# Patient Record
Sex: Male | Born: 1945 | Race: White | Hispanic: No | Marital: Married | Smoking: Never smoker
Health system: Southern US, Community
[De-identification: ages and names within clinical notes are randomized; demographics above are authoritative.]

## PROBLEM LIST (undated history)

## (undated) DIAGNOSIS — E118 Type 2 diabetes mellitus with unspecified complications: Secondary | ICD-10-CM

## (undated) DIAGNOSIS — N2889 Other specified disorders of kidney and ureter: Secondary | ICD-10-CM

## (undated) DIAGNOSIS — C801 Malignant (primary) neoplasm, unspecified: Secondary | ICD-10-CM

## (undated) DIAGNOSIS — I2510 Atherosclerotic heart disease of native coronary artery without angina pectoris: Secondary | ICD-10-CM

## (undated) DIAGNOSIS — R55 Syncope and collapse: Secondary | ICD-10-CM

## (undated) DIAGNOSIS — K219 Gastro-esophageal reflux disease without esophagitis: Secondary | ICD-10-CM

## (undated) DIAGNOSIS — I10 Essential (primary) hypertension: Secondary | ICD-10-CM

## (undated) DIAGNOSIS — M25561 Pain in right knee: Secondary | ICD-10-CM

## (undated) DIAGNOSIS — E119 Type 2 diabetes mellitus without complications: Secondary | ICD-10-CM

## (undated) DIAGNOSIS — G4733 Obstructive sleep apnea (adult) (pediatric): Secondary | ICD-10-CM

## (undated) DIAGNOSIS — Z973 Presence of spectacles and contact lenses: Secondary | ICD-10-CM

## (undated) DIAGNOSIS — R0602 Shortness of breath: Secondary | ICD-10-CM

## (undated) DIAGNOSIS — E785 Hyperlipidemia, unspecified: Secondary | ICD-10-CM

## (undated) DIAGNOSIS — N183 Chronic kidney disease, stage 3 unspecified: Secondary | ICD-10-CM

## (undated) DIAGNOSIS — Z789 Other specified health status: Secondary | ICD-10-CM

## (undated) DIAGNOSIS — Z9989 Dependence on other enabling machines and devices: Secondary | ICD-10-CM

## (undated) DIAGNOSIS — Z972 Presence of dental prosthetic device (complete) (partial): Secondary | ICD-10-CM

## (undated) DIAGNOSIS — I219 Acute myocardial infarction, unspecified: Secondary | ICD-10-CM

## (undated) DIAGNOSIS — E1142 Type 2 diabetes mellitus with diabetic polyneuropathy: Secondary | ICD-10-CM

## (undated) HISTORY — DX: Obstructive sleep apnea (adult) (pediatric): G47.33

## (undated) HISTORY — DX: Other specified disorders of kidney and ureter: N28.89

## (undated) HISTORY — DX: Type 2 diabetes mellitus with diabetic polyneuropathy: E11.42

## (undated) HISTORY — PX: COLONOSCOPY: SHX174

## (undated) HISTORY — PX: CERVICAL FUSION: SHX112

## (undated) HISTORY — PX: CORONARY ANGIOPLASTY WITH STENT PLACEMENT: SHX49

## (undated) HISTORY — PX: CARDIOVASCULAR STRESS TEST: SHX262

## (undated) HISTORY — PX: NASAL SINUS SURGERY: SHX719

## (undated) HISTORY — DX: Chronic kidney disease, stage 3 unspecified: N18.30

## (undated) HISTORY — DX: Dependence on other enabling machines and devices: Z99.89

## (undated) HISTORY — DX: Type 2 diabetes mellitus with unspecified complications: E11.8

---

## 1999-01-27 ENCOUNTER — Other Ambulatory Visit: Admission: RE | Admit: 1999-01-27 | Discharge: 1999-01-27 | Payer: Self-pay | Admitting: Gastroenterology

## 1999-01-27 ENCOUNTER — Encounter (INDEPENDENT_AMBULATORY_CARE_PROVIDER_SITE_OTHER): Payer: Self-pay

## 2000-11-09 ENCOUNTER — Inpatient Hospital Stay (HOSPITAL_COMMUNITY): Admission: RE | Admit: 2000-11-09 | Discharge: 2000-11-10 | Payer: Self-pay | Admitting: Neurosurgery

## 2000-11-09 ENCOUNTER — Encounter: Payer: Self-pay | Admitting: Neurosurgery

## 2000-12-01 ENCOUNTER — Encounter: Payer: Self-pay | Admitting: Neurosurgery

## 2000-12-01 ENCOUNTER — Encounter: Admission: RE | Admit: 2000-12-01 | Discharge: 2000-12-01 | Payer: Self-pay | Admitting: Neurosurgery

## 2001-01-17 ENCOUNTER — Encounter: Payer: Self-pay | Admitting: Neurosurgery

## 2001-01-17 ENCOUNTER — Encounter: Admission: RE | Admit: 2001-01-17 | Discharge: 2001-01-17 | Payer: Self-pay | Admitting: Neurosurgery

## 2004-10-07 ENCOUNTER — Ambulatory Visit: Payer: Self-pay | Admitting: Gastroenterology

## 2004-10-07 ENCOUNTER — Encounter (INDEPENDENT_AMBULATORY_CARE_PROVIDER_SITE_OTHER): Payer: Self-pay | Admitting: *Deleted

## 2004-11-18 ENCOUNTER — Ambulatory Visit: Payer: Self-pay | Admitting: Gastroenterology

## 2004-11-24 ENCOUNTER — Encounter (INDEPENDENT_AMBULATORY_CARE_PROVIDER_SITE_OTHER): Payer: Self-pay | Admitting: *Deleted

## 2004-11-24 ENCOUNTER — Ambulatory Visit: Payer: Self-pay | Admitting: Gastroenterology

## 2006-11-21 ENCOUNTER — Encounter: Payer: Self-pay | Admitting: Gastroenterology

## 2006-11-21 ENCOUNTER — Ambulatory Visit (HOSPITAL_COMMUNITY): Admission: EM | Admit: 2006-11-21 | Discharge: 2006-11-21 | Payer: Self-pay | Admitting: Emergency Medicine

## 2006-11-21 ENCOUNTER — Encounter (INDEPENDENT_AMBULATORY_CARE_PROVIDER_SITE_OTHER): Payer: Self-pay | Admitting: *Deleted

## 2006-11-25 ENCOUNTER — Ambulatory Visit: Payer: Self-pay | Admitting: Gastroenterology

## 2010-01-16 ENCOUNTER — Encounter (INDEPENDENT_AMBULATORY_CARE_PROVIDER_SITE_OTHER): Payer: Self-pay | Admitting: *Deleted

## 2010-03-04 ENCOUNTER — Encounter (INDEPENDENT_AMBULATORY_CARE_PROVIDER_SITE_OTHER): Payer: Self-pay | Admitting: *Deleted

## 2010-03-05 ENCOUNTER — Telehealth: Payer: Self-pay | Admitting: Internal Medicine

## 2010-03-06 ENCOUNTER — Ambulatory Visit: Admit: 2010-03-06 | Payer: Self-pay | Admitting: Internal Medicine

## 2010-03-06 ENCOUNTER — Ambulatory Visit: Payer: Self-pay | Admitting: Internal Medicine

## 2010-04-07 NOTE — Letter (Signed)
Summary: New Patient letter  [facility]  [address] [place], [place] [address]   Phone: [phone]  Fax: [phone]       01/16/2010 MRN: [id number]    Richard [name] [address] [place], [place]  [address]  Dear Richard [name],  Welcome to the Gastroenterology Division at [facility].    You are scheduled to see Dr. Leone Payor on 03/06/10 at 9:00 A.M.  on the 3rd floor at [facility], [address]  We ask that you try to arrive at ouroffice 15 minutes prior to your appointment time to allow for check-in.  We would like you to complete the enclosed self-administered evaluation form prior to your visit and bring it with you on the day of your appointment.  We will review it with you.  Also, please bring a complete list of all your medications or, if you prefer, bring the medication bottles and we will list them.  Please bring your insurance card so that we may make a copy of it.  If your insurance requires a referral to see a specialist, please bring your referral form from your primary care physician.  Co-payments are due at the time of your visit and may be paid by cash, check or credit card.     Your office visit will consist of a consult with your physician (includes a physical exam), any laboratory testing he/she may order, scheduling of any necessary diagnostic testing (e.g. x-ray, ultrasound, CT-scan), and scheduling of a procedure (e.g. Endoscopy, Colonoscopy) if required.  Please allow enough time on your schedule to allow for any/all of these possibilities.    If you cannot keep your appointment, please call [phone] to cancel or reschedule prior to your appointment date.  This allows [place] the opportunity to schedule an appointment for another patient in need of care.  If you do not cancel or reschedule by 5 p.m. the business day prior to your appointment date, you will be charged a $50.00 late cancellation/no-show fee.    Thank you for choosing [facility] for your medical needs.  We appreciate the opportunity to care for you.  Please visit [place] at our website  to learn more about our practice.                     Sincerely,                                                             The Gastroenterology Division

## 2010-04-09 NOTE — Procedures (Signed)
Summary: Office Note  Office Note   Imported By: Lamona Curl CMA (AAMA) 03/05/2010 12:40:29  _____________________________________________________________________  External Attachment:    Type:   Image     Comment:   External Document

## 2010-04-09 NOTE — Procedures (Signed)
Summary: COLON   Colonoscopy  Procedure date:  11/24/2004  Findings:      Location:  [facility].    Procedures Next Due Date:    Colonoscopy: 12/2014 Patient Name: Richard [name], Richard [name] MRN:  Procedure Procedures: Colonoscopy CPT: 16109.  Personnel: Endoscopist: Ulyess Mort, MD.  Exam Location: Exam performed in Outpatient Clinic. Outpatient  Patient Consent: Procedure, Alternatives, Risks and Benefits discussed, consent obtained, from patient. Consent was obtained by the RN.  Indications  Increased Risk Screening: For family history of colorectal neoplasia, in   History  Current Medications: Patient is not currently taking Coumadin.  Pre-Exam Physical: Entire physical exam was normal.  Exam Exam: Extent of exam reached: Cecum, extent intended: Cecum.  The cecum was identified by appendiceal orifice and IC valve. Colon retroflexion performed. Images taken. ASA Classification: II. Tolerance: good.  Monitoring: Pulse and BP monitoring, Oximetry used. Supplemental O2 given.  Colon Prep Prep results: good.  Sedation Meds: Patient assessed and found to be appropriate for moderate (conscious) sedation.  Findings - NOT SEEN ON EXAM: Cecum to Rectum. Polyps, Colitis, Tumors, Diverticulosis, Hemorrhoids,   Assessment Normal examination.  Events  Unplanned Interventions: No intervention was required.  Unplanned Events: There were no complications. Plans Medication Plan: Continue current medications.  Patient Education: Patient given standard instructions for: a normal exam. Yearly hemoccult testing recommended. Patient instructed to get routine colonoscopy every 10 years.  Disposition: After procedure patient sent to recovery. After recovery patient sent home.   This report was created from the original endoscopy report, which was reviewed and signed by the above listed endoscopist.

## 2010-04-09 NOTE — Progress Notes (Signed)
Summary: Okay to See Dr Leone Payor  Phone Note Outgoing Call   Call placed by: Lamona Curl CMA Duncan Dull),  March 05, 2010 1:10 PM Call placed to: Patient Summary of Call: Patient was originally on Dr Marvell Fuller schedule to discuss having a colonoscopy due to his family history. He states that he is not having any problems at all. Patient was originally a Dr Victorino Dike patient. Patient also had an endoscopy completed (on an emergency basis-due to swallowed toothpick) at [facility] by Dr Rob Bunting. I have spoken to Dr Leone Payor. He states that he is perfectly fine with the patient coming to see him for GI care (patient prefers to see Dr Leone Payor) in the future. Dr Leone Payor has reviewed patient's last colonoscopy report from 11/24/2004. He is aware that patient's father had colon cancer at age 51 and that patient's uncle had colon cancer around that same age. Per Dr Leone Payor, since patient's colonoscopy was normal and father had colon cancer later in life, patient should still be okay for 10 year recall, which would be in 2016. Patient has been advised of this. He again states that he is not having any problems. I advised him that he does not need to come see Dr Leone Payor in the office unless he wishes otherwise. Appt cancelled for Dr Leone Payor on 03/06/10. I have asked the patient to call us in the future should he develop specific symptoms or find out any changes in family GI history. Patient verbalizes understanding. Initial call taken by: Lamona Curl CMA Duncan Dull),  March 05, 2010 1:18 PM

## 2010-04-09 NOTE — Procedures (Signed)
Summary: EGD   EGD  Procedure date:  11/24/2004  Findings:      Location:  [facility]   Patient Name: Richard [name], Richard [name] MRN:  Procedure Procedures: Panendoscopy (EGD) CPT: 43235.  Personnel: Endoscopist: Ulyess Mort, MD.  Exam Location: Exam performed in Outpatient Clinic. Outpatient  Patient Consent: Procedure, Alternatives, Risks and Benefits discussed, consent obtained, from patient. Consent was obtained by the RN.  Indications Symptoms: Dysphagia. Reflux symptoms  History  Current Medications: Patient is not currently taking Coumadin.  Pre-Exam Physical: Entire physical exam was normal.  Exam Exam Info: Maximum depth of insertion Duodenum, intended Duodenum. Patient position: on left side. Vocal cords visualized. Gastric retroflexion performed. Images taken. ASA Classification: II. Tolerance: good.  Sedation Meds: Patient assessed and found to be appropriate for moderate (conscious) sedation. Fentanyl given IV. Versed given IV. Cetacaine Spray given aerosolized.  Monitoring: BP and pulse monitoring done. Oximetry used. Supplemental O2 given  Findings - ESOPHAGEAL INFLAMMATION: established as a result of reflux. ICD9: GERD: 530.81. Comments: changes in distal 7 cm. 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 # [id number].  - Dilation: Pyloric Sphincter. Maloney dilator used, Diameter: 52,54 mm, Minimal Resistance, No Heme present on extraction. Patient tolerance good.  - MUCOSAL ABNORMALITY: Fundus to Body. Granular mucosa. Edema present.  - MUCOSAL ABNORMALITY: Pyloric Sphincter to Jejunum. Granular mucosa.   Assessment Abnormal examination, see findings above.  Diagnoses: 530.81: GERD.   Events  Unplanned Intervention: No unplanned interventions were required.  Unplanned Events: There were no complications. Plans Medication(s): Continue current medications.  Patient Education: Patient given standard instructions for:  Hiatal Hernia. Reflux. Stenosis / Stricture. Mucosal Abnormality.  Disposition: After procedure patient sent to recovery. After recovery patient sent home.   This report was created from the original endoscopy report, which was reviewed and signed by the above listed endoscopist.

## 2010-04-09 NOTE — Procedures (Signed)
Summary: EGD   EGD  Procedure date:  11/21/2006  Findings:      Location: [facility]   Patient Name: Richard [name], Richard [name] MRN:  Procedure Procedures: Panendoscopy (EGD) CPT: 43235.  Personnel: Endoscopist: Rachael Fee, MD.  Exam Location: Exam performed in Operating Room. Outpatient  Patient Consent: Procedure, Alternatives, Risks and Benefits discussed, consent obtained, from patient. Consent was obtained by the RN.  Indications Symptoms: Dysphagia.  Comments: swallowed a toothpick History  Current Medications: Patient is not currently taking Coumadin.  Comments: Patient history reviewed/updated, physical exam performed prior to initiation of sedation? yes Pre-Exam Physical: Performed Nov 21, 2006  Cardio-pulmonary exam, Abdominal exam, Mental status exam WNL.  Comments: Pt. history reviewed/updated, physical exam performed prior to initiation of sedation? yes Exam Exam Info: Maximum depth of insertion Duodenum, intended Duodenum. Patient position: on left side. Vocal cords visualized. Gastric retroflexion performed. Images taken. ASA Classification: II. Tolerance: good.  Sedation Meds: Patient assessed and found to be appropriate for deep sedation. Sedation was managed by the Nurse Anesthetist.  Monitoring: BP and pulse monitoring done. Oximetry used. Supplemental O2 given  Findings - Normal: Proximal Esophagus to Jejunum. Comments: otherwise normal examination.  No foreign bodies.  OTHER FINDING: non-obstructive, mild peptic stricturing.  This was not dilated. in Distal Esophagus.   Assessment Abnormal examination, see findings above.  Comments: THE TOOTHPICK HAS PASSED FROM HIS UGI TRACT (FORMAL ENT EVALUATION IN ER WAS NORMAL).   Events  Unplanned Intervention: No unplanned interventions were required.  Unplanned Events: There were no complications. Plans Comments: HE KNOWS THAT IF HE HAS ABDOMINAL PAINS THAT WORSEN, HE SHOULD  CALL OR GO TO ER. Disposition: After procedure patient sent to recovery.   cc: The Patient  This report was created from the original endoscopy report, which was reviewed and signed by the above listed endoscopist.

## 2010-04-09 NOTE — Letter (Signed)
Summary: Appointment - Reschedule  [facility]  [address] [place], [place] [address]   Phone: [phone]  Fax: [phone]     March 04, 2010 MRN: [id number]   TORREZ RENFROE [address] [place], [place]  [address]   Dear Mr. Radwan,   Due to a change in our office schedule, your appointment on 03-06-10 at 9am                must be changed.  It is very important that we reach you to reschedule this appointment. We look forward to participating in your health care needs. Please contact us at [phone] at your earliest convenience to reschedule this appointment.     Sincerely,  Acupuncturist Team

## 2010-07-24 NOTE — Op Note (Signed)
[place]  Patient:    Richard [name], Richard [name] Visit Number: [id number] MRN: [id number]          Service Type: SUR Location: [id number] Attending Physician:  Barton Fanny Dictated by:   Hewitt Shorts, M.D. Proc. 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  OPERATION PERFORMED:  C4-5 and C5-6 anterior cervical diskectomy and arthrodesis with iliac crest allograft and tether cervical plating.  SURGEON:  Hewitt Shorts, M.D.  ASSISTANT:  Payton Doughty, M.D.  ANESTHESIA:  General endotracheal.  INDICATIONS FOR PROCEDURE:  The patient is a 65 year old man who presented with neck pain and pain radiating down through the left upper extremity consistent with radiculopathy with some radicular symptoms into the right upper extremity.  Decision was made to proceed with surgical decompression of advanced spondylosis, degenerative disk disease and disk herniation.  DESCRIPTION OF PROCEDURE:  The patient was brought to the operating room and placed under general endotracheal anesthesia.  The patient was placed in 10 pounds of halter traction and the neck was prepped with Betadine soap and solution and draped in sterile fashion.   A horizontal incision was made on the left side of the neck.  The line of the incision was infiltrated with local anesthetic with epinephrine.  Incision was made with a Shaw scalpel at a temperature of 120.  Dissection was carried down through the subcutaneous tissues and platysma.  Dissection was then carried down to dissection plane leaving the sternocleidomastoid, carotid artery and jugular vein laterally and trachea and esophagus medially.  We did  encounter some superficial large jugular veins briding across this area which were tied off and coagulated and incised.  Dissection was carried down to the anterior aspect of the vertebral column which was identified.  An x-ray was taken and the C4-5 and C5-6 vertebral disk spaces identified.  Diskectomy was begun with incision of the annulus and continued with microcurets and pituitary rongeurs.  Both disk spaces were markedly spondylitic with degenerated disk.  Ventral osteophytic overgrowth was removed using double action rongeurs.  The microscope was draped and brought into the field to provide additional magnification, illumination and visualization.  The remainder of the procedure was performed using microdissection and microsurgical technique.  The cartilaginous end plates of the corresponding vertebral body were removed using microcurets and a Micromax drill.  Posterior osteophytic overgrowth was removed using a Micromax drill with a 2 mm Kerrison punch and a thin foot plate.  Uncinate process hypertrophy and spondylitic spurring was removed using the Micromax drill and 2 mm Kerrison punch with a thin foot plate and posterior longitudinal ligament was opened and all loose fragments of disk material were removed.  In the end, good decompression of the spinal canal and thecal sac as well as the foramina and nerve root was achieved bilaterally at each level. Hemostasis was established with the use of Gelfoam soaked in thrombin and once the decompression was completed and hemostasis established, we proceeded with the arthrodesis.  We selected wedges of iliac crest allograft.  These were cut and shaped to size and positioned in the intervertebral disk space  and countersunk.  We then selected a two-level cervical plate and it was positioned over the fusion construct and secured to the C4 and C6 vertebrae with a pair of 4.0 x 14 mm screws and with a single 4.0 x 14 mm screw at C5. Because  of the patients large shoulders, we could not obtain an x-ray that would adequate visualize the fusion construct but under direct vision plate and screws were in good position.  The graft was in good position.  The alignment was good.  The wound was irrigated with bacitracin solution and checked for hemostasis which was established and confirmed.  Then we proceeded with closure.  The platysma was closed with interrupted inverted 2-0 undyed Vicryl sutures.  The subcutaneous and subcuticular layer were closed with interrupted inverted 3-0 undyed Vicryl sutures.  Skin edges were approximated with Dermabond.  The patient tolerated the procedure well.  Estimated blood loss for this procedure was 150 cc.  The sponge and needle counts were correct.  Following surgery the patient was taken out of traction and was placed in a soft cervical collar to be reversed from anesthetic, extubated and transferred to recovery room for further care. Dictated by:   Hewitt Shorts, M.D. Attending Physician:  Barton Fanny DD:  11/09/00 TD:  11/09/00 Job: [id number]

## 2011-10-11 ENCOUNTER — Other Ambulatory Visit: Payer: Self-pay | Admitting: Orthopedic Surgery

## 2011-10-11 MED ORDER — DEXAMETHASONE SODIUM PHOSPHATE 10 MG/ML IJ SOLN: 10.0000 mg | Freq: Once | INTRAMUSCULAR | Status: DC

## 2011-10-11 NOTE — Progress Notes (Signed)
Preoperative surgical orders have been place into the Epic hospital system for Richard [name] on 10/11/2011, 10:04 PM  by Patrica Duel for surgery on 10/22/2011.  Preop Total Knee orders including IV Tylenol, and IV Decadron as long as there are no contraindications to the above medications. Avel Peace, PA-C

## 2011-10-18 ENCOUNTER — Encounter (HOSPITAL_COMMUNITY): Payer: Self-pay

## 2011-10-18 ENCOUNTER — Encounter (HOSPITAL_COMMUNITY): Payer: Self-pay | Admitting: Pharmacy Technician

## 2011-10-18 ENCOUNTER — Encounter (HOSPITAL_COMMUNITY)
Admission: RE | Admit: 2011-10-18 | Discharge: 2011-10-18 | Disposition: A | Discharge status: Home / Self Care | Payer: BC Managed Care – PPO | Source: Ambulatory Visit | Attending: Orthopedic Surgery | Admitting: Orthopedic Surgery

## 2011-10-18 DIAGNOSIS — G4733 Obstructive sleep apnea (adult) (pediatric): Secondary | ICD-10-CM

## 2011-10-18 HISTORY — DX: Pain in right knee: M25.561

## 2011-10-18 HISTORY — DX: Atherosclerotic heart disease of native coronary artery without angina pectoris: I25.10

## 2011-10-18 HISTORY — DX: Gastro-esophageal reflux disease without esophagitis: K21.9

## 2011-10-18 HISTORY — DX: Obstructive sleep apnea (adult) (pediatric): G47.33

## 2011-10-18 HISTORY — DX: Essential (primary) hypertension: I10

## 2011-10-18 HISTORY — DX: Hyperlipidemia, unspecified: E78.5

## 2011-10-18 HISTORY — DX: Shortness of breath: R06.02

## 2011-10-18 LAB — BASIC METABOLIC PANEL
BUN: 15 mg/dL (ref 6–23)
CO2: 30 mEq/L (ref 19–32)
Chloride: 101 mEq/L (ref 96–112)
GFR calc Af Amer: >90 mL/min (ref >90)
Potassium: 4.2 mEq/L (ref 3.5–5.1)

## 2011-10-18 LAB — CBC
HCT: 44.6 % (ref 39.0–52.0)
Hemoglobin: 14.9 g/dL (ref 13.0–17.0)
MCH: 31.6 pg (ref 26.0–34.0)
MCHC: 33.4 g/dL (ref 30.0–36.0)
MCV: 94.7 fL (ref 78.0–100.0)
Platelets: 182 K/uL (ref 150–400)
RBC: 4.71 MIL/uL (ref 4.22–5.81)
RDW: 13.5 % (ref 11.5–15.5)
WBC: 6.9 K/uL (ref 4.0–10.5)

## 2011-10-18 LAB — PROTIME-INR
INR: 0.92 (ref 0.00–1.49)
Prothrombin Time: 12.6 s (ref 11.6–15.2)

## 2011-10-18 LAB — SURGICAL PCR SCREEN
MRSA, PCR: NEGATIVE
Staphylococcus aureus: NEGATIVE

## 2011-10-18 LAB — APTT: aPTT: 37 s (ref 24–37)

## 2011-10-18 NOTE — Patient Instructions (Addendum)
YOUR SURGERY IS SCHEDULED ON:  Friday 8/16  AT 5:00  PM  REPORT TO [facility] [facility]ORT STAY [facility] AT:  2:30 PAM      PHONE # FOR SHORT STAY IS [phone]  DO NOT EAT ANYTHING AFTER MIDNIGHT THE NIGHT BEFORE YOUR SURGERY.  NO FOOD, NO CHEWING GUM, NO MINTS, NO CANDIES, NO CHEWING TOBACCO. YOU MAY HAVE CLEAR LIQUIDS TO DRINK FROM MIDNIGHT UNTIL  11:00 AM DAY OF SURGERY--LIKE WATER, PEPSI, BLACK COFFEE.  NOTHING TO DRINK AFTER 11:00 AM DAY OF SURGERY.  PLEASE TAKE THE FOLLOWING MEDICATIONS THE AM OF YOUR SURGERY WITH A FEW SIPS OF WATER:  AMLODIPINE, METOPROLOL, PANTOPRAZOLE, PRAVASTATIN    IF YOU USE INHALERS--USE YOUR INHALERS THE AM OF YOUR SURGERY AND BRING INHALERS TO THE HOSPITAL -TAKE TO SURGERY.    IF YOU ARE DIABETIC:  DO NOT TAKE ANY DIABETIC MEDICATIONS THE AM OF YOUR SURGERY.  IF YOU TAKE INSULIN IN THE EVENINGS--PLEASE ONLY TAKE 1/2 NORMAL EVENING DOSE THE NIGHT BEFORE YOUR SURGERY.  NO INSULIN THE AM OF YOUR SURGERY.  IF YOU HAVE SLEEP APNEA AND USE CPAP OR BIPAP--PLEASE BRING THE MASK --NOT THE MACHINE-NOT THE TUBING   -JUST THE MASK. DO NOT BRING VALUABLES, MONEY, CREDIT CARDS.  CONTACT LENS, DENTURES / PARTIALS, GLASSES SHOULD NOT BE WORN TO SURGERY AND IN MOST CASES-HEARING AIDS WILL NEED TO BE REMOVED.  BRING YOUR GLASSES CASE, ANY EQUIPMENT NEEDED FOR YOUR CONTACT LENS. FOR PATIENTS ADMITTED TO THE HOSPITAL--CHECK OUT TIME THE DAY OF DISCHARGE IS 11:00 AM.  ALL INPATIENT ROOMS ARE PRIVATE - WITH BATHROOM, TELEPHONE, TELEVISION AND WIFI INTERNET. IF YOU ARE BEING DISCHARGED THE SAME DAY OF YOUR SURGERY--YOU CAN NOT DRIVE YOURSELF HOME--AND SHOULD NOT GO HOME ALONE BY TAXI OR BUS.  NO DRIVING OR OPERATING MACHINERY FOR 24 HOURS FOLLOWING ANESTHESIA / PAIN MEDICATIONS.                            SPECIAL INSTRUCTIONS:  CHLORHEXIDINE SOAP SHOWER (other brand names are Betasept and Hibiclens ) PLEASE SHOWER WITH CHLORHEXIDINE THE NIGHT BEFORE YOUR SURGERY AND THE AM OF YOUR  SURGERY. DO NOT USE CHLORHEXIDINE ON YOUR FACE OR PRIVATE AREAS--YOU MAY USE YOUR NORMAL SOAP THOSE AREAS AND YOUR NORMAL SHAMPOO.  WOMEN SHOULD AVOID SHAVING UNDER ARMS AND SHAVING LEGS 48 HOURS BEFORE USING CHLORHEXIDINE TO AVOID SKIN IRRITATION.  DO NOT USE IF ALLERGIC TO CHLORHEXIDINE.  PLEASE READ OVER ANY  FACT SHEETS THAT YOU WERE GIVEN: MRSA INFORMATIION,  INCENTIVE SPIROMETER INFORMATION.

## 2011-10-18 NOTE — Pre-Procedure Instructions (Signed)
NOTIFIED WENDY AT DR. ALUISIO'S OFFICE THAT PT TAKES EFFIENT -BLOOD THINNER BECAUSE OF HEART STENTING IN FEB  2013.  PT STATES HE STOPPED HIS ASPIRIN--HE WONDERED IF HE WAS TO STOP THE EFFIENT.  WENDY CALLED BACK AND SAID TO INSTRUCT PT NO MORE EFFIENT UNTIL AFTER HIS SURGERY 8/16 --PT INSTRUCTED WHILE IN PREOP. CBC, BMET, PT, PTT WERE DONE TODAY AT [facility] - PREOP. AS PER ANESTHESIOLOGIST'S GUIDELINES.  PT HAS EKG REPORT AND CARDIOLOGY OFFICE NOTE 07/26/11 FROM [facility].  PT HAS CXR REPORT FROM FROM [facility] FROM 04/19/11. PREOP TEACHING DISCUSSED WITH PT USING TEACH BACK METHOD.

## 2011-10-22 ENCOUNTER — Encounter (HOSPITAL_COMMUNITY): Payer: Self-pay | Admitting: *Deleted

## 2011-10-22 ENCOUNTER — Ambulatory Visit (HOSPITAL_COMMUNITY): Payer: BC Managed Care – PPO | Admitting: Anesthesiology

## 2011-10-22 ENCOUNTER — Encounter (HOSPITAL_COMMUNITY): Payer: Self-pay | Admitting: Anesthesiology

## 2011-10-22 ENCOUNTER — Encounter (HOSPITAL_COMMUNITY)
Admission: RE | Disposition: A | Discharge status: Home / Self Care | Payer: Self-pay | Source: Ambulatory Visit | Attending: Orthopedic Surgery

## 2011-10-22 ENCOUNTER — Ambulatory Visit (HOSPITAL_COMMUNITY)
Admission: RE | Admit: 2011-10-22 | Discharge: 2011-10-22 | Disposition: A | Discharge status: Home / Self Care | Payer: BC Managed Care – PPO | Source: Ambulatory Visit | Attending: Orthopedic Surgery | Admitting: Orthopedic Surgery

## 2011-10-22 DIAGNOSIS — G4730 Sleep apnea, unspecified: Secondary | ICD-10-CM | POA: Insufficient documentation

## 2011-10-22 DIAGNOSIS — E785 Hyperlipidemia, unspecified: Secondary | ICD-10-CM | POA: Insufficient documentation

## 2011-10-22 DIAGNOSIS — I10 Essential (primary) hypertension: Secondary | ICD-10-CM | POA: Insufficient documentation

## 2011-10-22 DIAGNOSIS — S83249A Other tear of medial meniscus, current injury, unspecified knee, initial encounter: Secondary | ICD-10-CM | POA: Diagnosis present

## 2011-10-22 DIAGNOSIS — M2240 Chondromalacia patellae, unspecified knee: Secondary | ICD-10-CM | POA: Insufficient documentation

## 2011-10-22 DIAGNOSIS — I2510 Atherosclerotic heart disease of native coronary artery without angina pectoris: Secondary | ICD-10-CM | POA: Insufficient documentation

## 2011-10-22 DIAGNOSIS — X58XXXA Exposure to other specified factors, initial encounter: Secondary | ICD-10-CM | POA: Insufficient documentation

## 2011-10-22 DIAGNOSIS — Z79899 Other long term (current) drug therapy: Secondary | ICD-10-CM | POA: Insufficient documentation

## 2011-10-22 DIAGNOSIS — K219 Gastro-esophageal reflux disease without esophagitis: Secondary | ICD-10-CM | POA: Insufficient documentation

## 2011-10-22 DIAGNOSIS — Z01812 Encounter for preprocedural laboratory examination: Secondary | ICD-10-CM | POA: Insufficient documentation

## 2011-10-22 DIAGNOSIS — IMO0002 Reserved for concepts with insufficient information to code with codable children: Secondary | ICD-10-CM | POA: Insufficient documentation

## 2011-10-22 DIAGNOSIS — Z981 Arthrodesis status: Secondary | ICD-10-CM | POA: Insufficient documentation

## 2011-10-22 HISTORY — PX: KNEE ARTHROSCOPY: SHX127

## 2011-10-22 SURGERY — ARTHROSCOPY, KNEE
Anesthesia: General | Site: Knee | Laterality: Right | Wound class: Clean

## 2011-10-22 MED ORDER — LACTATED RINGERS IR SOLN
Status: DC | PRN
  Administered 2011-10-22: 9000 mL

## 2011-10-22 MED ORDER — DEXTROSE 5 % IV SOLN
3.0000 g | INTRAVENOUS | Status: AC
  Administered 2011-10-22: 3 g via INTRAVENOUS
  Filled 2011-10-22: qty 3000

## 2011-10-22 MED ORDER — EPHEDRINE SULFATE 50 MG/ML IJ SOLN
INTRAMUSCULAR | Status: DC | PRN
  Administered 2011-10-22: 5 mg via INTRAVENOUS

## 2011-10-22 MED ORDER — SODIUM CHLORIDE 0.9 % IV SOLN: INTRAVENOUS | Status: DC

## 2011-10-22 MED ORDER — KETOROLAC TROMETHAMINE 30 MG/ML IJ SOLN
INTRAMUSCULAR | Status: AC
  Filled 2011-10-22: qty 1

## 2011-10-22 MED ORDER — FENTANYL CITRATE 0.05 MG/ML IJ SOLN
25.0000 ug | INTRAMUSCULAR | Status: DC | PRN
  Administered 2011-10-22: 50 ug via INTRAVENOUS

## 2011-10-22 MED ORDER — PROPOFOL 10 MG/ML IV EMUL
INTRAVENOUS | Status: DC | PRN
  Administered 2011-10-22: 250 mg via INTRAVENOUS

## 2011-10-22 MED ORDER — PROMETHAZINE HCL 25 MG/ML IJ SOLN: 6.2500 mg | INTRAMUSCULAR | Status: DC | PRN

## 2011-10-22 MED ORDER — METHOCARBAMOL 500 MG PO TABS: 500.0000 mg | ORAL_TABLET | Freq: Four times a day (QID) | ORAL | Status: AC

## 2011-10-22 MED ORDER — LACTATED RINGERS IV SOLN
INTRAVENOUS | Status: DC
  Administered 2011-10-22: 1000 mL via INTRAVENOUS
  Administered 2011-10-22: 20:00:00 via INTRAVENOUS

## 2011-10-22 MED ORDER — ACETAMINOPHEN 10 MG/ML IV SOLN
INTRAVENOUS | Status: AC
  Filled 2011-10-22: qty 100

## 2011-10-22 MED ORDER — CEFAZOLIN SODIUM-DEXTROSE 2-3 GM-% IV SOLR
INTRAVENOUS | Status: AC
  Filled 2011-10-22: qty 50

## 2011-10-22 MED ORDER — FENTANYL CITRATE 0.05 MG/ML IJ SOLN
INTRAMUSCULAR | Status: AC
  Filled 2011-10-22: qty 2

## 2011-10-22 MED ORDER — FENTANYL CITRATE 0.05 MG/ML IJ SOLN
INTRAMUSCULAR | Status: DC | PRN
  Administered 2011-10-22: 50 ug via INTRAVENOUS
  Administered 2011-10-22: 25 ug via INTRAVENOUS
  Administered 2011-10-22 (×2): 50 ug via INTRAVENOUS

## 2011-10-22 MED ORDER — KETOROLAC TROMETHAMINE 30 MG/ML IJ SOLN
15.0000 mg | Freq: Once | INTRAMUSCULAR | Status: AC | PRN
  Administered 2011-10-22: 30 mg via INTRAVENOUS

## 2011-10-22 MED ORDER — ACETAMINOPHEN 10 MG/ML IV SOLN
1000.0000 mg | Freq: Once | INTRAVENOUS | Status: AC
  Administered 2011-10-22: 1000 mg via INTRAVENOUS

## 2011-10-22 MED ORDER — ONDANSETRON HCL 4 MG/2ML IJ SOLN
INTRAMUSCULAR | Status: DC | PRN
  Administered 2011-10-22: 4 mg via INTRAVENOUS

## 2011-10-22 MED ORDER — CHLORHEXIDINE GLUCONATE 4 % EX LIQD
60.0000 mL | Freq: Once | CUTANEOUS | Status: DC
  Filled 2011-10-22: qty 60

## 2011-10-22 MED ORDER — BUPIVACAINE-EPINEPHRINE PF 0.25-1:200000 % IJ SOLN
INTRAMUSCULAR | Status: AC
  Filled 2011-10-22: qty 30

## 2011-10-22 MED ORDER — OXYCODONE HCL 5 MG PO TABS: 5.0000 mg | ORAL_TABLET | ORAL | Status: AC | PRN

## 2011-10-22 MED ORDER — BUPIVACAINE-EPINEPHRINE 0.25% -1:200000 IJ SOLN
INTRAMUSCULAR | Status: DC | PRN
  Administered 2011-10-22: 20 mL

## 2011-10-22 MED ORDER — MIDAZOLAM HCL 5 MG/5ML IJ SOLN
INTRAMUSCULAR | Status: DC | PRN
  Administered 2011-10-22: 2 mg via INTRAVENOUS

## 2011-10-22 MED ORDER — LIDOCAINE HCL (CARDIAC) 20 MG/ML IV SOLN
INTRAVENOUS | Status: DC | PRN
  Administered 2011-10-22: 100 mg via INTRAVENOUS

## 2011-10-22 MED ORDER — CEFAZOLIN SODIUM 1-5 GM-% IV SOLN
INTRAVENOUS | Status: AC
  Filled 2011-10-22: qty 50

## 2011-10-22 SURGICAL SUPPLY — 31 items
BANDAGE ELASTIC 6 VELCRO ST LF (GAUZE/BANDAGES/DRESSINGS) ×1 IMPLANT
BLADE 4.2CUDA (BLADE) ×2 IMPLANT
CLOTH BEACON ORANGE TIMEOUT ST (SAFETY) ×2 IMPLANT
CUFF TOURN SGL QUICK 34 (TOURNIQUET CUFF) ×2
CUFF TRNQT CYL 34X4X40X1 (TOURNIQUET CUFF) ×1 IMPLANT
DRAPE U-SHAPE 47X51 STRL (DRAPES) ×2 IMPLANT
DRSG EMULSION OIL 3X3 NADH (GAUZE/BANDAGES/DRESSINGS) ×2 IMPLANT
DRSG PAD ABDOMINAL 8X10 ST (GAUZE/BANDAGES/DRESSINGS) ×2 IMPLANT
DURAPREP 26ML APPLICATOR (WOUND CARE) ×2 IMPLANT
GLOVE BIO SURGEON STRL SZ7.5 (GLOVE) ×1 IMPLANT
GLOVE BIO SURGEON STRL SZ8 (GLOVE) ×2 IMPLANT
GLOVE BIOGEL PI IND STRL 8 (GLOVE) ×1 IMPLANT
GLOVE BIOGEL PI INDICATOR 8 (GLOVE) ×1
GLOVE SURG SS PI 6.5 STRL IVOR (GLOVE) ×1 IMPLANT
GOWN STRL NON-REIN LRG LVL3 (GOWN DISPOSABLE) ×3 IMPLANT
IV LACTATED RINGER IRRG 3000ML (IV SOLUTION) ×6
IV LR IRRIG 3000ML ARTHROMATIC (IV SOLUTION) IMPLANT
MANIFOLD NEPTUNE II (INSTRUMENTS) ×3 IMPLANT
PACK ARTHROSCOPY WL (CUSTOM PROCEDURE TRAY) ×2 IMPLANT
PACK ICE MAXI GEL EZY WRAP (MISCELLANEOUS) ×6 IMPLANT
PADDING CAST ABS 6INX4YD NS (CAST SUPPLIES) ×1
PADDING CAST ABS COTTON 6X4 NS (CAST SUPPLIES) IMPLANT
PADDING CAST COTTON 6X4 STRL (CAST SUPPLIES) ×2 IMPLANT
POSITIONER SURGICAL ARM (MISCELLANEOUS) ×2 IMPLANT
SET ARTHROSCOPY TUBING (MISCELLANEOUS) ×2
SET ARTHROSCOPY TUBING LN (MISCELLANEOUS) ×1 IMPLANT
SUT ETHILON 4 0 PS 2 18 (SUTURE) ×2 IMPLANT
TOWEL OR 17X26 10 PK STRL BLUE (TOWEL DISPOSABLE) ×2 IMPLANT
WAND 90 DEG TURBOVAC W/CORD (SURGICAL WAND) ×2 IMPLANT
WATER STERILE IRR 500ML POUR (IV SOLUTION) ×1 IMPLANT
WRAP KNEE MAXI GEL POST OP (GAUZE/BANDAGES/DRESSINGS) ×4 IMPLANT

## 2011-10-22 NOTE — Anesthesia Preprocedure Evaluation (Addendum)
Anesthesia Evaluation  Patient identified by MRN, date of birth, ID band Patient awake    Reviewed: Allergy & Precautions, H&P , NPO status , Patient's Chart, lab work & pertinent test results  Airway Mallampati: II TM Distance: <3 FB Neck ROM: Full    Dental No notable dental hx.    Pulmonary sleep apnea ,  breath sounds clear to auscultation  Pulmonary exam normal       Cardiovascular hypertension, Pt. on medications + CAD and + Cardiac Stents Rhythm:Regular Rate:Normal     Neuro/Psych negative neurological ROS  negative psych ROS   GI/Hepatic negative GI ROS, Neg liver ROS,   Endo/Other  negative endocrine ROSMorbid obesity  Renal/GU negative Renal ROS  negative genitourinary   Musculoskeletal negative musculoskeletal ROS (+)   Abdominal   Peds negative pediatric ROS (+)  Hematology negative hematology ROS (+)   Anesthesia Other Findings   Reproductive/Obstetrics negative OB ROS                           Anesthesia Physical Anesthesia Plan  ASA: III  Anesthesia Plan: General   Post-op Pain Management:    Induction: Intravenous  Airway Management Planned: LMA  Additional Equipment:   Intra-op Plan:   Post-operative Plan:   Informed Consent: I have reviewed the patients History and Physical, chart, labs and discussed the procedure including the risks, benefits and alternatives for the proposed anesthesia with the patient or authorized representative who has indicated his/her understanding and acceptance.   Dental advisory given  Plan Discussed with: CRNA and Surgeon  Anesthesia Plan Comments:         Anesthesia Quick Evaluation

## 2011-10-22 NOTE — Anesthesia Postprocedure Evaluation (Signed)
  Anesthesia Post-op Note  Patient: Richard [name]  Procedure(s) Performed: Procedure(s) (LRB): ARTHROSCOPY KNEE (Right)  Patient Location: PACU  Anesthesia Type: General  Level of Consciousness: awake and alert   Airway and Oxygen Therapy: Patient Spontanous Breathing  Post-op Pain: mild  Post-op Assessment: Post-op Vital signs reviewed, Patient's Cardiovascular Status Stable, Respiratory Function Stable, Patent Airway and No signs of Nausea or vomiting  Post-op Vital Signs: stable  Complications: No apparent anesthesia complications

## 2011-10-22 NOTE — H&P (Signed)
  CC- Richard [name] is a 66 y.o. male who presents with right knee pain.  HPI- . Knee Pain: Patient presents with knee pain involving the  right knee. Onset of the symptoms was several months ago. Inciting event: twisted knee. Current symptoms include giving out, pain located medially, stiffness and swelling. Pain is aggravated by pivoting, rising after sitting and squatting.  Patient has had no prior knee problems. Evaluation to date: MRI: medial meniscal tear. Treatment to date: OTC analgesics which are not very effective and rest.  Past Medical History  Diagnosis Date  . Coronary artery disease     STENT LAD   . Hypertension   . Hyperlipidemia      . Shortness of breath     WITH EXERTION  . GERD (gastroesophageal reflux disease)     HAS HAD ESOPHAGUS STRETCHED SEVERAL TIMES IN THE PAST  . Right knee pain     TORN RIGHT KNEE MEDIAL MENSICAL TEAR  . Sleep apnea 10/18/11    STOP BANG SCORE OF 6    Past Surgical History  Procedure Date  . Cervical fusion     X 2   SURGERIES    SLIGHT LIMITATION ROM  . Nasal sinus surgery     ONE SINUS SURGERY THRU NOSE AND ANOTHER SINUS SURGERY THRU INCISION ABOVE RT EYE    Prior to Admission medications   Medication Sig Start Date End Date Taking? Authorizing Provider  amLODipine (NORVASC) 5 MG tablet Take 5 mg by mouth daily before breakfast.   Yes Historical Provider, MD  cholecalciferol (VITAMIN D) 1000 UNITS tablet Take 2,000 Units by mouth daily. TAKES 2000 UNITS DAILY   Yes Historical Provider, MD  ibuprofen (ADVIL,MOTRIN) 200 MG tablet Take 400 mg by mouth every 6 (six) hours as needed. pain   Yes Historical Provider, MD  metoprolol succinate (TOPROL-XL) 50 MG 24 hr tablet Take 50 mg by mouth daily before breakfast. Take with or immediately following a meal.   Yes Historical Provider, MD  pantoprazole (PROTONIX) 40 MG tablet Take 40 mg by mouth daily.   Yes Historical Provider, MD  prasugrel (EFFIENT) 10 MG TABS Take 10 mg by mouth daily  before breakfast.   Yes Historical Provider, MD  pravastatin (PRAVACHOL) 20 MG tablet Take 20 mg by mouth daily.   Yes Historical Provider, MD  aspirin EC 81 MG tablet Take 81 mg by mouth daily.    Historical Provider, MD   KNEE EXAM soft tissue tenderness over medial joint line, reduced range of motion, collateral ligaments intact, normal ipsilateral hip exam  Physical Examination: General appearance - alert, well appearing, and in no distress Mental status - alert, oriented to person, place, and time Chest - clear to auscultation, no wheezes, rales or rhonchi, symmetric air entry Heart - normal rate, regular rhythm, normal S1, S2, no murmurs, rubs, clicks or gallops Abdomen - soft, nontender, nondistended, no masses or organomegaly Neurological - alert, oriented, normal speech, no focal findings or movement disorder noted   Asessment/Plan--- Right knee medial meniscal tear- - Plan right knee arthroscopy with meniscal debridement. Procedure risks and potential comps discussed with patient who elects to proceed. Goals are decreased pain and increased function with a high likelihood of achieving both

## 2011-10-22 NOTE — Op Note (Signed)
Preoperative diagnosis-  Right knee medial meniscal tear  Postoperative diagnosis Right- knee medial meniscal tear   Plus Right medial femoral chondral defect  Procedure- Right knee arthroscopy with medial  Meniscal debridement and chondroplasty  Surgeon- Gus Rankin. Bora Bost, MD  Anesthesia-General  EBL-  minimal Complications- None  Condition- PACU - hemodynamically stable.  Brief clinical note- -Richard [name] is a 66 y.o.  male with a several month history of left knee pain and mechanical symptoms. Exam and history suggested medial meniscal tear confirmed by MRI. The patient presents now for arthroscopy and debridement   Procedure in detail -       After successful administration of General anesthetic, a tourmiquet is placed high on the Right  thigh and the Right lower extremity is prepped and draped in the usual sterile fashion. Time out is performed by the surgical team. Standard superomedial and inferolateral portal sites are marked and incisions made with an 11 blade. The inflow cannula is passed through the superomedial portal and camera through the inferolateral portal and inflow is initiated. Arthroscopic visualization proceeds.      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 The medial and lateral gutters are visualized and there are  no loose bodies. Flexion and valgus force is applied to the knee and the medial compartment is entered. A spinal needle is passed into the joint through the site marked for the inferomedial portal. A small incision is made and the dilator passed into the joint. The findings for the medial compartment are there is a degenerative tear of the medial meniscus and a 2 x 2 cm chondral defect medial femoral condyle . The tear is debrided to a stable base with baskets and a shaver and sealed off with the Arthrocare. The shaver is used to debride the unstable cartilage to  a stable cartilaginous base with stable edges. It is probed and found to be stable.    The intercondylar notch is visualized and the ACL appears normal. The lateral compartment is entered and the findings are normal .      The joint is again inspected and there are no other tears, defects or loose bodies identified. The arthroscopic equipment is then removed from the inferior portals which are closed with interrupted 4-0 nylon. 20 ml of .25% Marcaine with epinephrine are injected through the inflow cannula and the cannula is then removed and the portal closed with nylon. The incisions are cleaned and dried and a bulky sterile dressing is applied. The patient is then awakened and transported to recovery in stable condition.   10/22/2011, 7:11 PM

## 2011-10-22 NOTE — Transfer of Care (Signed)
Immediate Anesthesia Transfer of Care Note  Patient: Richard [name]  Procedure(s) Performed: Procedure(s) (LRB): ARTHROSCOPY KNEE (Right)  Patient Location: PACU  Anesthesia Type: General  Level of Consciousness: awake, alert , oriented and patient cooperative  Airway & Oxygen Therapy: Patient Spontanous Breathing and Patient connected to face mask oxygen  Post-op Assessment: Report given to PACU RN, Post -op Vital signs reviewed and stable and Patient moving all extremities  Post vital signs: Reviewed and stable  Complications: No apparent anesthesia complications

## 2011-10-22 NOTE — Preoperative (Signed)
Beta Blockers   Reason not to administer Beta Blockers:Toprol taken at 0700 10-22-11

## 2011-10-22 NOTE — Interval H&P Note (Signed)
History and Physical Interval Note:  10/22/2011 6:00 PM  Richard [name]  has presented today for surgery, with the diagnosis of right knee medial meniscus tear  The various methods of treatment have been discussed with the patient and family. After consideration of risks, benefits and other options for treatment, the patient has consented to  Procedure(s) (LRB): ARTHROSCOPY KNEE (Right) as a surgical intervention .  The patient's history has been reviewed, patient examined, no change in status, stable for surgery.  I have reviewed the patient's chart and labs.  Questions were answered to the patient's satisfaction.     Loanne Drilling

## 2011-10-25 ENCOUNTER — Encounter (HOSPITAL_COMMUNITY): Payer: Self-pay | Admitting: Orthopedic Surgery

## 2012-10-08 ENCOUNTER — Observation Stay (HOSPITAL_COMMUNITY)
Admission: EM | Admit: 2012-10-08 | Discharge: 2012-10-09 | Disposition: A | Discharge status: Home / Self Care | Payer: Medicare Other | Attending: Cardiovascular Disease | Admitting: Cardiovascular Disease

## 2012-10-08 ENCOUNTER — Emergency Department (HOSPITAL_COMMUNITY): Payer: Medicare Other

## 2012-10-08 ENCOUNTER — Ambulatory Visit (HOSPITAL_COMMUNITY): Admit: 2012-10-08 | Payer: Self-pay | Admitting: Cardiovascular Disease

## 2012-10-08 ENCOUNTER — Encounter (HOSPITAL_COMMUNITY): Payer: Self-pay | Admitting: *Deleted

## 2012-10-08 ENCOUNTER — Encounter (HOSPITAL_COMMUNITY)
Admission: EM | Disposition: A | Discharge status: Home / Self Care | Payer: Self-pay | Source: Home / Self Care | Attending: Cardiovascular Disease

## 2012-10-08 DIAGNOSIS — Y832 Surgical operation with anastomosis, bypass or graft as the cause of abnormal reaction of the patient, or of later complication, without mention of misadventure at the time of the procedure: Secondary | ICD-10-CM | POA: Insufficient documentation

## 2012-10-08 DIAGNOSIS — I10 Essential (primary) hypertension: Secondary | ICD-10-CM | POA: Insufficient documentation

## 2012-10-08 DIAGNOSIS — K219 Gastro-esophageal reflux disease without esophagitis: Secondary | ICD-10-CM | POA: Insufficient documentation

## 2012-10-08 DIAGNOSIS — R079 Chest pain, unspecified: Secondary | ICD-10-CM | POA: Insufficient documentation

## 2012-10-08 DIAGNOSIS — T82897A Other specified complication of cardiac prosthetic devices, implants and grafts, initial encounter: Principal | ICD-10-CM | POA: Insufficient documentation

## 2012-10-08 DIAGNOSIS — E785 Hyperlipidemia, unspecified: Secondary | ICD-10-CM | POA: Insufficient documentation

## 2012-10-08 DIAGNOSIS — I2510 Atherosclerotic heart disease of native coronary artery without angina pectoris: Secondary | ICD-10-CM | POA: Insufficient documentation

## 2012-10-08 DIAGNOSIS — I249 Acute ischemic heart disease, unspecified: Secondary | ICD-10-CM

## 2012-10-08 DIAGNOSIS — I200 Unstable angina: Secondary | ICD-10-CM | POA: Insufficient documentation

## 2012-10-08 HISTORY — DX: Syncope and collapse: R55

## 2012-10-08 HISTORY — PX: LEFT HEART CATHETERIZATION WITH CORONARY ANGIOGRAM: SHX5451

## 2012-10-08 LAB — CBC WITH DIFFERENTIAL/PLATELET
Basophils Absolute: 0 10*3/uL (ref 0.0–0.1)
Eosinophils Absolute: 0.1 10*3/uL (ref 0.0–0.7)
Eosinophils Relative: 1 % (ref 0–5)
HCT: 45.6 % (ref 39.0–52.0)
Lymphocytes Relative: 22 % (ref 12–46)
Lymphs Abs: 2.5 10*3/uL (ref 0.7–4.0)
MCH: 32.8 pg (ref 26.0–34.0)
MCV: 92.9 fL (ref 78.0–100.0)
Monocytes Absolute: 0.9 10*3/uL (ref 0.1–1.0)
Platelets: 189 10*3/uL (ref 150–400)
RDW: 13.8 % (ref 11.5–15.5)

## 2012-10-08 LAB — COMPREHENSIVE METABOLIC PANEL
ALT: 24 U/L (ref 0–53)
CO2: 28 mEq/L (ref 19–32)
Calcium: 9.8 mg/dL (ref 8.4–10.5)
Creatinine, Ser: 1.38 mg/dL — ABNORMAL HIGH (ref 0.50–1.35)
GFR calc Af Amer: 60 mL/min — ABNORMAL LOW (ref >90)
GFR calc non Af Amer: 52 mL/min — ABNORMAL LOW (ref >90)
Glucose, Bld: 126 mg/dL — ABNORMAL HIGH (ref 70–99)
Sodium: 140 mEq/L (ref 135–145)
Total Protein: 6.8 g/dL (ref 6.0–8.3)

## 2012-10-08 LAB — PROTIME-INR
INR: 0.99 (ref 0.00–1.49)
Prothrombin Time: 12.9 seconds (ref 11.6–15.2)

## 2012-10-08 LAB — GLUCOSE, CAPILLARY: Glucose-Capillary: 142 mg/dL — ABNORMAL HIGH (ref 70–99)

## 2012-10-08 LAB — POCT I-STAT TROPONIN I: Troponin i, poc: 0 ng/mL (ref 0.00–0.08)

## 2012-10-08 LAB — MRSA PCR SCREENING: MRSA by PCR: NEGATIVE

## 2012-10-08 SURGERY — LEFT HEART CATHETERIZATION WITH CORONARY ANGIOGRAM
Anesthesia: LOCAL

## 2012-10-08 MED ORDER — MORPHINE SULFATE 4 MG/ML IJ SOLN
4.0000 mg | Freq: Once | INTRAMUSCULAR | Status: AC | PRN
  Administered 2012-10-08: 4 mg via INTRAVENOUS
  Filled 2012-10-08: qty 1

## 2012-10-08 MED ORDER — HEPARIN SODIUM (PORCINE) 1000 UNIT/ML IJ SOLN
INTRAMUSCULAR | Status: AC
  Filled 2012-10-08: qty 1

## 2012-10-08 MED ORDER — NITROGLYCERIN IN D5W 200-5 MCG/ML-% IV SOLN
5.0000 ug/min | INTRAVENOUS | Status: DC
  Administered 2012-10-08: 5 ug/min via INTRAVENOUS
  Filled 2012-10-08: qty 250

## 2012-10-08 MED ORDER — HEPARIN (PORCINE) IN NACL 100-0.45 UNIT/ML-% IJ SOLN
1300.0000 [IU]/h | INTRAMUSCULAR | Status: DC
  Administered 2012-10-08: 1300 [IU]/h via INTRAVENOUS
  Filled 2012-10-08: qty 250

## 2012-10-08 MED ORDER — ONDANSETRON HCL 4 MG/2ML IJ SOLN: 4.0000 mg | Freq: Four times a day (QID) | INTRAMUSCULAR | Status: DC | PRN

## 2012-10-08 MED ORDER — SODIUM CHLORIDE 0.9 % IV SOLN: INTRAVENOUS | Status: DC

## 2012-10-08 MED ORDER — VITAMIN D3 25 MCG (1000 UNIT) PO TABS
2000.0000 [IU] | ORAL_TABLET | Freq: Every day | ORAL | Status: DC
  Administered 2012-10-08 – 2012-10-09 (×2): 2000 [IU] via ORAL
  Filled 2012-10-08 (×2): qty 2

## 2012-10-08 MED ORDER — AMLODIPINE BESYLATE 5 MG PO TABS
5.0000 mg | ORAL_TABLET | Freq: Every day | ORAL | Status: DC
  Administered 2012-10-09: 5 mg via ORAL
  Filled 2012-10-08 (×2): qty 1

## 2012-10-08 MED ORDER — MORPHINE SULFATE 2 MG/ML IJ SOLN
INTRAMUSCULAR | Status: AC
  Filled 2012-10-08: qty 1

## 2012-10-08 MED ORDER — ACETAMINOPHEN 325 MG PO TABS: 650.0000 mg | ORAL_TABLET | ORAL | Status: DC | PRN

## 2012-10-08 MED ORDER — IBUPROFEN 600 MG PO TABS
600.0000 mg | ORAL_TABLET | Freq: Every morning | ORAL | Status: DC
  Administered 2012-10-09: 600 mg via ORAL
  Filled 2012-10-08: qty 1

## 2012-10-08 MED ORDER — HEPARIN BOLUS VIA INFUSION
4000.0000 [IU] | Freq: Once | INTRAVENOUS | Status: AC
  Administered 2012-10-08: 4000 [IU] via INTRAVENOUS

## 2012-10-08 MED ORDER — MORPHINE SULFATE 2 MG/ML IJ SOLN
2.0000 mg | Freq: Once | INTRAMUSCULAR | Status: AC
  Administered 2012-10-08: 2 mg via INTRAVENOUS

## 2012-10-08 MED ORDER — VERAPAMIL HCL 2.5 MG/ML IV SOLN
INTRAVENOUS | Status: AC
  Filled 2012-10-08: qty 2

## 2012-10-08 MED ORDER — SIMVASTATIN 40 MG PO TABS
40.0000 mg | ORAL_TABLET | Freq: Every day | ORAL | Status: DC
  Filled 2012-10-08: qty 1

## 2012-10-08 MED ORDER — NITROGLYCERIN 0.4 MG SL SUBL: 0.4000 mg | SUBLINGUAL_TABLET | SUBLINGUAL | Status: DC | PRN

## 2012-10-08 MED ORDER — ASPIRIN EC 81 MG PO TBEC
81.0000 mg | DELAYED_RELEASE_TABLET | Freq: Every day | ORAL | Status: DC
  Administered 2012-10-09: 81 mg via ORAL
  Filled 2012-10-08: qty 1

## 2012-10-08 MED ORDER — PANTOPRAZOLE SODIUM 40 MG PO TBEC
40.0000 mg | DELAYED_RELEASE_TABLET | Freq: Every day | ORAL | Status: DC
  Administered 2012-10-08 – 2012-10-09 (×2): 40 mg via ORAL
  Filled 2012-10-08 (×2): qty 1

## 2012-10-08 MED ORDER — MORPHINE SULFATE 2 MG/ML IJ SOLN: 1.0000 mg | INTRAMUSCULAR | Status: DC | PRN

## 2012-10-08 MED ORDER — SODIUM CHLORIDE 0.9 % IJ SOLN
3.0000 mL | Freq: Two times a day (BID) | INTRAMUSCULAR | Status: DC
  Administered 2012-10-08 – 2012-10-09 (×2): 3 mL via INTRAVENOUS

## 2012-10-08 MED ORDER — HEPARIN (PORCINE) IN NACL 2-0.9 UNIT/ML-% IJ SOLN
INTRAMUSCULAR | Status: AC
  Filled 2012-10-08: qty 1000

## 2012-10-08 MED ORDER — SODIUM CHLORIDE 0.9 % IV SOLN
INTRAVENOUS | Status: AC
  Administered 2012-10-08 – 2012-10-09 (×2): via INTRAVENOUS

## 2012-10-08 MED ORDER — SODIUM CHLORIDE 0.9 % IJ SOLN: 3.0000 mL | INTRAMUSCULAR | Status: DC | PRN

## 2012-10-08 MED ORDER — LIDOCAINE HCL (PF) 1 % IJ SOLN
INTRAMUSCULAR | Status: AC
  Filled 2012-10-08: qty 30

## 2012-10-08 MED ORDER — METOPROLOL SUCCINATE ER 25 MG PO TB24
25.0000 mg | ORAL_TABLET | Freq: Every day | ORAL | Status: DC
  Administered 2012-10-08 – 2012-10-09 (×2): 25 mg via ORAL
  Filled 2012-10-08 (×2): qty 1

## 2012-10-08 MED ORDER — NITROGLYCERIN 0.2 MG/ML ON CALL CATH LAB
INTRAVENOUS | Status: AC
  Filled 2012-10-08: qty 1

## 2012-10-08 MED ORDER — SODIUM CHLORIDE 0.9 % IV SOLN: 250.0000 mL | INTRAVENOUS | Status: DC | PRN

## 2012-10-08 NOTE — ED Notes (Signed)
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  Pt. Began to feel light headed and passed out.  Pt. Loss control over urine and was very pale and diapheretic. Pt. Took One Nitro and 324mg  ASA.  Pt. Has a past Hx. Of a cardiac Stent and a "widow Maker heart Attack."

## 2012-10-08 NOTE — H&P (Signed)
ADMISSION HISTORY AND PHYSICAL   Date: 10/08/2012               Patient Name:  Richard [name] MRN: [id number]  DOB: 1945/10/22 Age / Sex: 67 y.o., male        PCP: Pcp Not In System Primary Cardiologist: [facility]         History of Present Illness: Patient is a 67 y.o. male with a PMHx of CAD, who was admitted to [facility] on 10/08/2012 for evaluation of  CP , weakness, pre-syncope.  Patient is a 67 yo M PMHx significant for CAD, LAD stent placement ( Feb. 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  Around 11 AM  Patient became very pale and diaphoretic and then had a syncopal episode.  He had profuse diaphoresis . Pt took one Nitro tablet and 324mg  ASA prior to arrival which has eased his chest pressure down to 3/10. Patient is followed at [facility].   He had an episode of CP a month ago while walking to the wood shed.  He stopped to rest and the pain resolved after 20-30 minutes.   He has not felt well since that time.  He is currently feeling a bit better.  Still having 2/10 CP / tightness / heaviness  He is trying to eat better.  He does not exercise regularly.  Stays active but has not been able to do as much for the past month.    Medications: Outpatient medications: Current Facility-Administered Medications  Medication Dose Route Frequency Provider Last Rate Last Dose  . heparin ADULT infusion 100 units/mL (25000 units/250 mL)  1,000 Units/hr Intravenous Continuous Nelia Shi, MD      . heparin bolus via infusion 4,000 Units  4,000 Units Intravenous Once Nelia Shi, MD      . nitroGLYCERIN 0.2 mg/mL in dextrose 5 % infusion  5 mcg/min Intravenous Titrated Nelia Shi, MD       Current Outpatient Prescriptions  Medication  Sig Dispense Refill  . amLODipine (NORVASC) 5 MG tablet Take 5 mg by mouth daily before breakfast.      . aspirin EC 81 MG tablet Take 81 mg by mouth daily.      . cholecalciferol (VITAMIN D) 1000 UNITS tablet Take 2,000 Units by mouth daily.       Marland Kitchen ibuprofen (ADVIL,MOTRIN) 200 MG tablet Take 600 mg by mouth every morning. pain      . metFORMIN (GLUCOPHAGE) 500 MG tablet Take 500 mg by mouth 2 (two) times daily with a meal.      . metoprolol succinate (TOPROL-XL) 25 MG 24 hr tablet Take 25 mg by mouth daily.      . Omega-3 Fatty Acids (OMEGA 3 PO) Take 1 capsule by mouth daily.      . pantoprazole (PROTONIX) 40 MG tablet Take 40 mg by mouth daily.      . pravastatin (PRAVACHOL) 20 MG tablet Take 20 mg by mouth daily.         No Known Allergies   Past Medical History  Diagnosis Date  . Coronary artery disease     STENT LAD   . Hypertension   . Hyperlipidemia      .  Shortness of breath     WITH EXERTION  . GERD (gastroesophageal reflux disease)     HAS HAD ESOPHAGUS STRETCHED SEVERAL TIMES IN THE PAST  . Right knee pain     TORN RIGHT KNEE MEDIAL MENSICAL TEAR  . Sleep apnea 10/18/11    STOP BANG SCORE OF 6    Past Surgical History  Procedure Laterality Date  . Cervical fusion      X 2   SURGERIES    SLIGHT LIMITATION ROM  . Nasal sinus surgery      ONE SINUS SURGERY THRU NOSE AND ANOTHER SINUS SURGERY THRU INCISION ABOVE RT EYE  . Knee arthroscopy  10/22/2011    Procedure: ARTHROSCOPY KNEE;  Surgeon: Loanne Drilling, MD;  Location: [facility];  Service: Orthopedics;  Laterality: Right;  Right knee scope with debridement  . Coronary angioplasty with stent placement      History reviewed. No pertinent family history.  Social History:  reports that he has never smoked. He has quit using smokeless tobacco. He reports that he does not drink alcohol or use illicit drugs.   Review of Systems: Constitutional:  denies fever, chills, diaphoresis, appetite change and fatigue.  HEENT:  denies photophobia, eye pain, redness, hearing loss, ear pain, congestion, sore throat, rhinorrhea, sneezing, neck pain, neck stiffness and tinnitus.  Respiratory: denies SOB, DOE, cough, chest tightness, and wheezing.  Cardiovascular: admits to chest pain, palpitations and +  leg swelling.  Gastrointestinal: denies nausea, vomiting, abdominal pain, diarrhea, constipation, blood in stool.  Genitourinary: denies dysuria, urgency, frequency, hematuria, flank pain and difficulty urinating.  Musculoskeletal: denies  myalgias, back pain, joint swelling, arthralgias and gait problem.   Skin: denies pallor, rash and wound.  Neurological: denies dizziness, seizures, syncope, weakness, light-headedness, numbness and headaches.   Hematological: denies adenopathy, easy bruising, personal or family bleeding history.  Psychiatric/ Behavioral: denies suicidal ideation, mood changes, confusion, nervousness, sleep disturbance and agitation.    Physical Exam: BP 114/69  Pulse 76  Temp(Src) 97.7 F (36.5 C) (Oral)  Resp 19  SpO2 99%  General: Vital signs reviewed and noted. Well-developed,  Obese, mildly anxious   Head: Normocephalic, atraumatic, sclera anicteric, mucus membranes are moist  Neck: Supple. Negative for carotid bruits. JVD not elevated.  Lungs:  Clear bilaterally to auscultation without wheezes, rales, or rhonchi. Breathing is unlabored.  Heart: RRR with S1 S2. No murmurs, rubs, or gallops    Abdomen:  Obese, no, + BS  MSK: Strength and the appear normal for age.  Extremities: No clubbing or cyanosis. No edema.  Distal pedal pulses are 2+ and equal bilaterally.  Neurologic: Alert and oriented X 3. Moves all extremities spontaneously  Psych:  Responds to questions appropriately with a normal affect.    Lab results: Basic Metabolic Panel:  Recent Labs Lab 10/08/12 1258  NA 140  K 4.1  CL 100  CO2 28  GLUCOSE 126*  BUN 21  CREATININE 1.38*  CALCIUM 9.8    Liver Function  Tests:  Recent Labs Lab 10/08/12 1258  AST 24  ALT 24  ALKPHOS 85  BILITOT 0.4  PROT 6.8  ALBUMIN 4.0   No results found for this basename: LIPASE, AMYLASE,  in the last 168 hours  CBC:  Recent Labs Lab 10/08/12 1258  WBC 11.3*  NEUTROABS 7.7  HGB 16.1  HCT 45.6  MCV 92.9  PLT 189    Cardiac Enzymes: No results found for this basename: CKTOTAL, CKMB, CKMBINDEX, TROPONINI,  in the  last 168 hours  BNP: No components found with this basename: POCBNP,   CBG: No results found for this basename: GLUCAP,  in the last 168 hours  Coagulation Studies:  Recent Labs  10/08/12 1258  LABPROT 12.9  INR 0.99     Other results:  EKG :  NSR, no ST or T wave changes.    Imaging: Dg Chest Portable 1 View  10/08/2012   *RADIOLOGY REPORT*  Clinical Data: Near syncope.  Chest pressure.  PORTABLE CHEST - 1 VIEW  Comparison: None.  Findings: Low lung volumes.  Bibasilar opacities, left greater than right, likely atelectasis.  Heart size is accentuated by the low volumes and portable nature of the study, which I suspect is within normal limits.  No visible significant effusion or acute bony abnormality.  IMPRESSION: Low lung volumes with bibasilar opacities, left greater than right, likely atelectasis.   Original Report Authenticated By: Charlett Nose, M.D.       Assessment & Plan: 1. CAD:   Richard [name] presents with symptoms of unstable angina.   These symptoms are very similar to his presenting symptoms that he had a year ago before his stent.   He has had similar symptoms a month ago and has not felt well since that time.  His initial cardiac markers are negative.  He is having very mild chest heaviness ( 2/10 ) we are starting heparin and NTG.  He is very stoic and seems to minimize his symptoms.    Will admit. Check cardiac enzymes.  Anticipate cath tomorrow unless we have to take him to the lab tonight.   His ECG does not look acute.    DVT PPX -    Vesta Mixer, Montez Hageman., MD,  [facility] 10/08/2012, 3:00 PM

## 2012-10-08 NOTE — CV Procedure (Signed)
Richard [name] is a 67 y.o. male    [id number] LOCATION:  FACILITY: [facility]  PHYSICIAN: Nanetta Batty, M.D. [birth year]   DATE OF PROCEDURE:  10/08/2012  DATE OF DISCHARGE:   CARDIAC CATHETERIZATION     History obtained from chart review. 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 He presented [place] or chest pain there was ongoing despite IV heparin and nitroglycerin. EKG at no acute changes. He was evaluated by Dr. Elease Hashimoto , [facility] , who felt that he should come to the cath lab for diagnostic coronary arteriography to define his anatomy.   PROCEDURE DESCRIPTION:    The patient was brought to the second floor  Cardiac cath lab in the postabsorptive state. He was no premedicated . His right wristwas prepped and shaved in usual sterile fashion. Xylocaine 1% was used for local anesthesia. A 6 French sheath was inserted into the right radial artery using standard Seldinger technique. The patient received 4000 units  of heparin  intravenously.  A. TIG catheter and pigtail catheter were used for selective coronary angiography, left ventriculography, and supravalvular aortography. Visipaque was used for the entirety of the case. Retrograde aortic, left ventricular and pullback pressures were recorded. A total of 80 cc of contrast was administered to the patient.    HEMODYNAMICS:    AO SYSTOLIC/AO DIASTOLIC: 99/62   LV SYSTOLIC/LV DIASTOLIC: 97/18  ANGIOGRAPHIC RESULTS:   1. Left main; normal  2. LAD; patent stent in the proximal LAD with at most 30% smooth in-stent restenosis 3. Left circumflex; nondominant and normal.  4. Right coronary artery; large, dominant and normal 5 Left ventriculography; RAO left ventriculogram was performed using  25 mL of Visipaque dye at 12 mL/second. The overall LVEF estimated  60% Without wall motion abnormalities 6. Supravalvular aortography: Supravalvular  aortogram was performed using 20 cc of Visipaque dye at 20 cc per second. The aortic root appeared dilated the bladder there was no dissection and the arch vessels appeared intact  IMPRESSION:Richard [name] has a widely patent proximal LAD stent with otherwise normal coronary arteries and normal left function. There is no obvious culprit vessel or source of chest pain that is cardiovascular. The sheath was removed and a T-R band was placed on the right wrist to achieve . The patient left the Cath Lab in stable condition. The cardiac enzymes will be cycled. He'll be treated empirically with antibiotics medications.   Richard Gess MD, [facility] 10/08/2012 6:29 PM

## 2012-10-08 NOTE — ED Notes (Signed)
Per pt.'s wife.  Pt.'s blood pressure on scene was 78/41 with his personal B/P machine.

## 2012-10-08 NOTE — ED Notes (Signed)
Cardiologist is at bedside.  New ECG done.

## 2012-10-08 NOTE — Progress Notes (Signed)
ANTICOAGULATION CONSULT NOTE - Initial Consult  Pharmacy Consult for Heparin Indication: chest pain/ACS  No Known Allergies  Patient Measurements:  Weight- 121.5 kg Height- 5'11 IBW- 75.3 kg  Heparin Dosing Weight: 102 kg  Vital Signs: Temp: 97.7 F (36.5 C) (08/03 1238) Temp src: Oral (08/03 1238) BP: 114/69 mmHg (08/03 1418) Pulse Rate: 76 (08/03 1418)  Labs:  Recent Labs  10/08/12 1258  HGB 16.1  HCT 45.6  PLT 189  APTT 28  LABPROT 12.9  INR 0.99  CREATININE 1.38*    The CrCl is unknown because both a height and weight (above a minimum accepted value) are required for this calculation.   Medical History: Past Medical History  Diagnosis Date  . Coronary artery disease     STENT LAD   . Hypertension   . Hyperlipidemia      . Shortness of breath     WITH EXERTION  . GERD (gastroesophageal reflux disease)     HAS HAD ESOPHAGUS STRETCHED SEVERAL TIMES IN THE PAST  . Right knee pain     TORN RIGHT KNEE MEDIAL MENSICAL TEAR  . 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 CBC is wnl. No bleeding reported. SCR is elevated at 1.38.    Goal of Therapy:  Heparin level 0.3-0.7 units/ml Monitor platelets by anticoagulation protocol: Yes   Plan:  1. Continue heparin bolus of 4000 units, and increase drip rate to 1300 units/hr. 2. Heparin level in 6 hours.  3. Daily heparin level and CBC while on therapy.   Link Snuffer, PharmD, BCPS Clinical Pharmacist [phone] 10/08/2012,3:20 PM

## 2012-10-08 NOTE — ED Provider Notes (Signed)
CSN: [id number]     Arrival date & time 10/08/12  1243 History     First MD Initiated Contact with Patient 10/08/12 1247     Chief Complaint  Patient presents with  . Chest Pain  . 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 Patient became very pale and diaphoretic and then had a syncopal episode with loss of control of his urine. Pt took one Nitro tablet and 324mg  ASA prior to arrival which has eased his chest pressure down to 3/10. Patient is followed at [facility]. Patient had an LAD stent placed two years ago with his last stress test one year ago.    Past Medical History  Diagnosis Date  . Coronary artery disease     STENT LAD   . Hypertension   . Hyperlipidemia      . Shortness of breath     WITH EXERTION  . GERD (gastroesophageal reflux disease)     HAS HAD ESOPHAGUS STRETCHED SEVERAL TIMES IN THE PAST  . Right knee pain     TORN RIGHT KNEE MEDIAL MENSICAL TEAR  . Sleep apnea 10/18/11    STOP BANG SCORE OF 6   Past Surgical History  Procedure Laterality Date  . Cervical fusion      X 2   SURGERIES    SLIGHT LIMITATION ROM  . Nasal sinus surgery      ONE SINUS SURGERY THRU NOSE AND ANOTHER SINUS SURGERY THRU INCISION ABOVE RT EYE  . Knee arthroscopy  10/22/2011    Procedure: ARTHROSCOPY KNEE;  Surgeon: Loanne Drilling, MD;  Location: [facility];  Service: Orthopedics;  Laterality: Right;  Right knee scope with debridement  . Coronary angioplasty with stent placement     History reviewed. No pertinent family history. History  Substance Use Topics  . Smoking status: Never Smoker   . Smokeless tobacco: Former Neurosurgeon  . Alcohol  Use: No    Review of Systems  Constitutional: Positive for diaphoresis. Negative for fever and chills.  HENT: Negative.   Eyes: Negative for pain.  Respiratory: Positive for shortness of breath. Negative for cough.   Cardiovascular: Positive for chest pain.  Gastrointestinal: Positive for nausea. Negative for vomiting and abdominal pain.  Genitourinary: Negative.   Musculoskeletal: Positive for back pain.  Skin: Negative.   Neurological: Positive for syncope and light-headedness.    Allergies  Review of patient's allergies indicates no known allergies.  Home Medications   Current Outpatient Rx  Name  Route  Sig  Dispense  Refill  . amLODipine (NORVASC) 5 MG tablet   Oral   Take 5 mg by mouth daily before breakfast.         . aspirin EC 81 MG tablet   Oral   Take 81 mg by mouth daily.         . cholecalciferol (VITAMIN D) 1000 UNITS tablet   Oral   Take 2,000 Units by mouth daily.          Marland Kitchen ibuprofen (ADVIL,MOTRIN) 200 MG tablet   Oral   Take 600 mg by mouth every morning. pain         . metFORMIN (GLUCOPHAGE) 500 MG tablet   Oral  Take 500 mg by mouth 2 (two) times daily with a meal.         . metoprolol succinate (TOPROL-XL) 25 MG 24 hr tablet   Oral   Take 25 mg by mouth daily.         . Omega-3 Fatty Acids (OMEGA 3 PO)   Oral   Take 1 capsule by mouth daily.         . pantoprazole (PROTONIX) 40 MG tablet   Oral   Take 40 mg by mouth daily.         . pravastatin (PRAVACHOL) 20 MG tablet   Oral   Take 20 mg by mouth daily.          BP 114/69  Pulse 76  Temp(Src) 97.7 F (36.5 C) (Oral)  Resp 19  SpO2 99% Physical Exam  Constitutional: He is oriented to person, place, and time. He appears well-developed and well-nourished. No distress.  HENT:  Head: Normocephalic and atraumatic.  Mouth/Throat: Oropharynx is clear and moist.  Eyes: Conjunctivae and EOM are normal. Pupils are equal, round, and reactive to light.  Neck: Neck  supple.  Cardiovascular: Normal rate, regular rhythm, normal heart sounds and intact distal pulses.   Pulmonary/Chest: Effort normal and breath sounds normal. No respiratory distress.  Abdominal: Soft. There is no tenderness.  Musculoskeletal: He exhibits no edema.  Neurological: He is alert and oriented to person, place, and time.  Skin: Skin is warm. No rash noted. He is diaphoretic. No pallor.    ED Course   Procedures (including critical care time)   Date: 10/08/2012  Rate: 79  Rhythm: normal sinus rhythm  QRS Axis: left  Intervals: normal  ST/T Wave abnormalities: normal  Conduction Disutrbances:left anterior fascicular block  Narrative Interpretation: LVH, Anterior Q waves  Old EKG Reviewed: none available    Labs Reviewed  CBC WITH DIFFERENTIAL - Abnormal; Notable for the following:    WBC 11.3 (*)    All other components within normal limits  COMPREHENSIVE METABOLIC PANEL - Abnormal; Notable for the following:    Glucose, Bld 126 (*)    Creatinine, Ser 1.38 (*)    GFR calc non Af Amer 52 (*)    GFR calc Af Amer 60 (*)    All other components within normal limits  PROTIME-INR  APTT  HEPARIN LEVEL (UNFRACTIONATED)  POCT I-STAT TROPONIN I   Dg Chest Portable 1 View  10/08/2012   *RADIOLOGY REPORT*  Clinical Data: Near syncope.  Chest pressure.  PORTABLE CHEST - 1 VIEW  Comparison: None.  Findings: Low lung volumes.  Bibasilar opacities, left greater than right, likely atelectasis.  Heart size is accentuated by the low volumes and portable nature of the study, which I suspect is within normal limits.  No visible significant effusion or acute bony abnormality.  IMPRESSION: Low lung volumes with bibasilar opacities, left greater than right, likely atelectasis.   Original Report Authenticated By: Charlett Nose, M.D.   1. CAD (coronary artery disease)   2. Acute coronary syndrome   3. Unstable angina     MDM  Concern for cardiac etiology of Chest Pain. No acute  abnormalities found on EKG and first round of cardiac enzymes negative. Cardiology has been consulted and will see patient in the ED for likely admit. Dr. Elease Hashimoto recommended Heparin and Nitro gtt and if patient continues to have pain despite these additional medications then patient may be going to the cath lab. On re-evaluation pt's pain has not improved,  pt will be taken to cath lab d/t un resolving CP. This case was discussed with Dr. Radford Pax who has seen the patient and agrees with plan to admit.             Jeannetta Ellis, PA-C 10/08/12 1827

## 2012-10-08 NOTE — H&P (Signed)
.    Pt was reexamined and existing H & P reviewed. No changes found.  Runell Gess, MD [facility] 10/08/2012 5:49 PM

## 2012-10-09 ENCOUNTER — Encounter (HOSPITAL_COMMUNITY): Payer: Self-pay | Admitting: Physician Assistant

## 2012-10-09 DIAGNOSIS — I2510 Atherosclerotic heart disease of native coronary artery without angina pectoris: Secondary | ICD-10-CM

## 2012-10-09 LAB — CBC
HCT: 41.2 % (ref 39.0–52.0)
MCHC: 34.2 g/dL (ref 30.0–36.0)
MCV: 93.4 fL (ref 78.0–100.0)
RDW: 14 % (ref 11.5–15.5)
WBC: 8.7 10*3/uL (ref 4.0–10.5)

## 2012-10-09 LAB — LIPID PANEL
Cholesterol: 117 mg/dL (ref 0–200)
HDL: 32 mg/dL — ABNORMAL LOW (ref >39)
Total CHOL/HDL Ratio: 3.7 RATIO

## 2012-10-09 LAB — BASIC METABOLIC PANEL
BUN: 18 mg/dL (ref 6–23)
CO2: 25 mEq/L (ref 19–32)
Chloride: 104 mEq/L (ref 96–112)
Creatinine, Ser: 0.99 mg/dL (ref 0.50–1.35)

## 2012-10-09 MED ORDER — ASPIRIN 300 MG RE SUPP
300.0000 mg | RECTAL | Status: DC
  Filled 2012-10-09: qty 1

## 2012-10-09 MED ORDER — NITROGLYCERIN 0.4 MG SL SUBL: 0.4000 mg | SUBLINGUAL_TABLET | SUBLINGUAL | Status: DC | PRN

## 2012-10-09 MED ORDER — METFORMIN HCL 500 MG PO TABS: 500.0000 mg | ORAL_TABLET | Freq: Two times a day (BID) | ORAL | Status: DC

## 2012-10-09 MED ORDER — ASPIRIN 81 MG PO CHEW: 324.0000 mg | CHEWABLE_TABLET | ORAL | Status: DC

## 2012-10-09 MED ORDER — ASPIRIN EC 81 MG PO TBEC
81.0000 mg | DELAYED_RELEASE_TABLET | Freq: Every day | ORAL | Status: DC
  Filled 2012-10-09: qty 1

## 2012-10-09 MED ORDER — ACETAMINOPHEN 325 MG PO TABS: 650.0000 mg | ORAL_TABLET | ORAL | Status: DC | PRN

## 2012-10-09 MED ORDER — PRAVASTATIN SODIUM 40 MG PO TABS
80.0000 mg | ORAL_TABLET | Freq: Every day | ORAL | Status: DC
  Filled 2012-10-09: qty 2

## 2012-10-09 NOTE — Progress Notes (Signed)
Patient ID: Richard [name], male   DOB: 20-Sep-1945, 67 y.o.   MRN: [id number]    Subjective:  Denies SSCP, palpitations or Dyspnea Wants to go home   Objective:  Filed Vitals:   10/09/12 0501 10/09/12 0600 10/09/12 0700 10/09/12 0735  BP:  141/84 146/78 142/73  Pulse:  69 71 69  Temp:    98.7 F (37.1 C)  TempSrc:    Oral  Resp:  17 21 18   Height:      Weight:      SpO2: 96% 96% 95% 96%    Intake/Output from previous day:  Intake/Output Summary (Last 24 hours) at 10/09/12 0753 Last data filed at 10/09/12 0700  Gross per 24 hour  Intake   2035 ml  Output   1350 ml  Net    685 ml    Physical Exam: Affect appropriate Obese white male HEENT: normal Neck supple with no adenopathy JVP normal no bruits no thyromegaly Lungs clear with no wheezing and good diaphragmatic motion Heart:  S1/S2 no murmur, no rub, gallop or click PMI normal Abdomen: benighn, BS positve, no tenderness, no AAA no bruit.  No HSM or HJR Distal pulses intact with no bruits No edema Neuro non-focal Skin warm and dry No muscular weakness Right radial A   Lab Results: Basic Metabolic Panel:  Recent Labs  40/98/11 1258 10/09/12 0225  NA 140 140  K 4.1 3.9  CL 100 104  CO2 28 25  GLUCOSE 126* 106*  BUN 21 18  CREATININE 1.38* 0.99  CALCIUM 9.8 8.8   Liver Function Tests:  Recent Labs  10/08/12 1258  AST 24  ALT 24  ALKPHOS 85  BILITOT 0.4  PROT 6.8  ALBUMIN 4.0   No results found for this basename: LIPASE, AMYLASE,  in the last 72 hours CBC:  Recent Labs  10/08/12 1258  WBC 11.3*  NEUTROABS 7.7  HGB 16.1  HCT 45.6  MCV 92.9  PLT 189   Cardiac Enzymes:  Recent Labs  10/08/12 2110 10/09/12 0225  TROPONINI <0.30 <0.30   Fasting Lipid Panel:  Recent Labs  10/09/12 0225  CHOL 117  HDL 32*  LDLCALC 46  TRIG 914*  CHOLHDL 3.7    Imaging: Dg Chest Portable 1 View  10/08/2012   *RADIOLOGY REPORT*  Clinical Data: Near syncope.  Chest pressure.  PORTABLE  CHEST - 1 VIEW  Comparison: None.  Findings: Low lung volumes.  Bibasilar opacities, left greater than right, likely atelectasis.  Heart size is accentuated by the low volumes and portable nature of the study, which I suspect is within normal limits.  No visible significant effusion or acute bony abnormality.  IMPRESSION: Low lung volumes with bibasilar opacities, left greater than right, likely atelectasis.   Original Report Authenticated By: Charlett Nose, M.D.    Cardiac Studies:  ECG:  SR rate 73 LAD LVH no acute changes    Telemetry:  NSR no VT 10/09/2012   Echo:   Medications:   . amLODipine  5 mg Oral QAC breakfast  . aspirin  324 mg Oral NOW   Or  . aspirin  300 mg Rectal NOW  . [START ON 10/10/2012] aspirin  324 mg Oral Pre-Cath  . aspirin EC  81 mg Oral Daily  . aspirin EC  81 mg Oral Daily  . cholecalciferol  2,000 Units Oral Daily  . ibuprofen  600 mg Oral q morning - 10a  . metoprolol succinate  25 mg Oral Daily  .  pantoprazole  40 mg Oral Daily  . simvastatin  40 mg Oral q1800  . sodium chloride  3 mL Intravenous Q12H     . nitroGLYCERIN Stopped (10/08/12 1950)    Assessment/Plan:  Chest Pain:  Patent LAD stent No evidence for DVT  Continue medical Rx aspirin and beta blocker  F/U Nahser Chol:  Continue statin HTN:  Continue amlodipine   Charlton Haws 10/09/2012, 7:53 AM

## 2012-10-09 NOTE — Progress Notes (Signed)
Documented numerous brief episodes of patient SPO2 in lower 80s. Desat associated with brief apneic episodes while Pt sleeping

## 2012-10-09 NOTE — Progress Notes (Signed)
Pt and caregiver given d/c instructions using teachback method per AVS. Pt and Caregiver used teach back method when verbalizing all instructions given. IVs removed. Pt assisted into WC and taken to the pt drop off are by the nurse tech.

## 2012-10-09 NOTE — Plan of Care (Signed)
Problem: Food- and Nutrition-Related Knowledge Deficit (NB-1.1) Goal: Nutrition education Formal process to instruct or train a patient/client in a skill or to impart knowledge to help patients/clients voluntarily manage or modify food choices and eating behavior to maintain or improve health.  Outcome: Completed/Met Date Met:  10/09/12 Nutrition Education Note  RD consulted for nutrition education regarding a Heart Healthy diet.   Lipid Panel     Component Value Date/Time    CHOL 117 10/09/2012 0225    TRIG 194* 10/09/2012 0225    HDL 32* 10/09/2012 0225    CHOLHDL 3.7 10/09/2012 0225    VLDL 39 10/09/2012 0225    LDLCALC 46 10/09/2012 0225    RD provided "Low Sodium Nutrition Therapy" handout from the Academy of [employer] and Dietetics. Reviewed patient's dietary recall. Provided examples on ways to decrease sodium and fat intake in diet. Discouraged intake of processed foods and use of salt shaker. Reviewed seasoning options.  Discussed sea salt. Encouraged fresh fruits and vegetables as well as whole grain sources of carbohydrates to maximize fiber intake. Teach back method used.  Expect good compliance.  Body mass index is 36.59 kg/(m^2). Pt meets criteria for obese class 2 based on current BMI.  Current diet order is Heart Healthy, patient is consuming approximately 75% of meals at this time. Labs and medications reviewed. No further nutrition interventions warranted at this time. RD contact information provided. If additional nutrition issues arise, please re-consult RD.  Loyce Dys, MS RD LDN Clinical Inpatient Dietitian Pager: [phone] Weekend/After hours pager: [phone]

## 2012-10-09 NOTE — Discharge Summary (Signed)
Discharge Summary   Patient ID: Richard [name] MRN: [id number], DOB/AGE: January 07, 1946 67 y.o. Admit date: 10/08/2012 D/C date:     10/09/2012  Primary Cardiologist: Nahser  Primary Discharge Diagnoses:  1. Chest pain with stable CAD by cath this admission (patent stent, otherwise normal coronaries) 2. Syncope 3. CAD s/p LAD stent 04/2011 4. HTN 5. Hyperlipidemia  Secondary Discharge Diagnoses:  1. GERD  2. R knee pain TORN RIGHT KNEE MEDIAL MENSICAL TEAR  3. Sleep apnea STOP BANG SCORE OF 6   Hospital Course: Mr. 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 Around 11 AM he became very pale and diaphoretic and then had a syncopal episode. He had profuse diaphoresis. He took one nitro tablet and 324mg  ASA prior to arrival which eased CP down to 3/10. He had an episode of CP a month ago while walking to the wood shed. He stopped to rest and the pain resolved after 20-30 minutes. He has not felt well since that time. EKG showed NSR without ST-T wave changes.  He was started on heparin per pharmacy and underwent cardiac cath due to concern for [place]. This demonstrated a widely patent prox LAD with otherwise normal coronary arteries and EF 60%. Supravalvular aortogram showed that the aortic root appeared dilated ("the bladder"?) there was no dissection and the arch vessels appeared intact. Dr. Eden Emms reviewed this information per our discussion and does not feel any further workup is necessary at this time. He was not tachycardic or tachypnic. He did have nocturnal desaturations while sleeping but this was consistent with his prior sleep apnea, was otherwise without daytime hypoxia. Telemetry did not reveal any arrhythmia.  This morning the patient feels well. Labwork was unrevealing. Troponins remained negative. Dr. Eden Emms has seen and examined the patient today and feels he is stable for discharge. The patient was instructed to continue treatment for sleep apnea given the nocturnal desaturations documented in the hospital.  Discharge Vitals: Blood pressure 130/77, pulse 73, temperature 98.7 F (37.1 C), temperature source Oral, resp. rate 20, height 5\' 11"  (1.803 m), weight 262 lb 3.2 oz (118.933 kg), SpO2 95.00%.  Labs: Lab Results  Component Value Date   WBC 8.7 10/09/2012   HGB 14.1 10/09/2012   HCT 41.2 10/09/2012   MCV 93.4 10/09/2012   PLT 158 10/09/2012     Recent Labs Lab 10/08/12 1258 10/09/12 0225  NA 140 140  K 4.1 3.9  CL 100 104  CO2 28 25  BUN 21 18  CREATININE 1.38* 0.99  CALCIUM 9.8 8.8  PROT 6.8  --   BILITOT 0.4  --   ALKPHOS 85  --   ALT 24  --   AST 24  --   GLUCOSE 126* 106*    Recent Labs  10/08/12 2110 10/09/12 0225 10/09/12 0832  TROPONINI <0.30 <0.30 <0.30   Lab Results  Component Value Date   CHOL 117 10/09/2012   HDL 32* 10/09/2012   LDLCALC 46 10/09/2012   TRIG 194* 10/09/2012    Diagnostic Studies/Procedures   Dg Chest Portable 1 View 10/08/2012   *RADIOLOGY REPORT*  Clinical Data: Near syncope.  Chest pressure.  PORTABLE CHEST - 1 VIEW  Comparison: None.  Findings: Low lung volumes.  Bibasilar opacities, left greater than right, likely atelectasis.  Heart size is accentuated by the low volumes and portable nature of the study, which I suspect is within normal limits.  No visible significant effusion or acute bony abnormality.  IMPRESSION: Low lung volumes with bibasilar opacities, left greater than right, likely atelectasis.   Original Report Authenticated By: Charlett Nose, M.D.   Cardiac Cath 10/08/12 1. Left main; normal  2. LAD; patent stent in the proximal LAD with at most 30% smooth in-stent restenosis  3. Left circumflex; nondominant and normal.  4. Right coronary  artery; large, dominant and normal  5 Left ventriculography; RAO left ventriculogram was performed using  25 mL of Visipaque dye at 12 mL/second. The overall LVEF estimated  60% Without wall motion abnormalities  6. Supravalvular aortography: Supravalvular aortogram was performed using 20 cc of Visipaque dye at 20 cc per second. The aortic root appeared dilated the bladder there was no dissection and the arch vessels appeared intact  IMPRESSION:Mr. Millirons has a widely patent proximal LAD stent with otherwise normal coronary arteries and normal left function. There is no obvious culprit vessel or source of chest pain that is cardiovascular. The sheath was removed and a T-R band was placed on the right wrist to achieve . The patient left the Cath Lab in stable condition. The cardiac enzymes will be cycled. He'll be treated empirically with antibiotics medications.   Discharge Medications     Medication List         amLODipine 5 MG tablet  Commonly known as:  NORVASC  Take 5 mg by mouth daily before breakfast.     aspirin EC 81 MG tablet  Take 81 mg by mouth daily.     cholecalciferol 1000 UNITS tablet  Commonly known as:  VITAMIN D  Take 2,000 Units by mouth daily.     ibuprofen 200 MG tablet  Commonly known as:  ADVIL,MOTRIN  Take 600 mg by mouth every morning. pain     metFORMIN 500 MG tablet  Commonly known as:  GLUCOPHAGE  Take 1 tablet (500 mg total) by mouth 2 (two) times daily with a meal.  Start taking on:  10/11/2012     metoprolol succinate 25 MG 24 hr tablet  Commonly known as:  TOPROL-XL  Take 25 mg by mouth daily.     nitroGLYCERIN 0.4 MG SL tablet  Commonly known as:  NITROSTAT  Place 1 tablet (0.4 mg total) under the tongue every 5 (five) minutes as needed for chest pain (up to 3 doses).     OMEGA 3 PO  Take 1 capsule by mouth daily.     pantoprazole 40 MG tablet  Commonly known as:  PROTONIX  Take 40 mg by mouth daily.     pravastatin 20 MG tablet    Commonly known as:  PRAVACHOL  Take 20 mg by mouth daily.        Disposition   The patient will be discharged in stable condition to home. Discharge Orders   Future Appointments Provider Department Dept Phone   10/31/2012 8:50 AM Beatrice Lecher, PA-C [facility] Main Office [place]) [phone]   Future Orders Complete By Expires     Diet - low sodium heart healthy  As directed     Increase activity slowly  As directed     Comments:      No driving for 2 days. No lifting over 5 lbs for 1  week. No sexual activity for 1 week. Keep procedure site clean & dry. If you notice increased pain, swelling, bleeding or pus, call/return!  You may shower, but no soaking baths/hot tubs/pools for 1 week. If your symptoms come back, stop what you are doing and seek medical attention.      Follow-up Information   Follow up with Primary Care Provider. (It is important that you continue treatment for your sleep apnea since we were able to tell in the hospital that your oxygen level goes down at night when you sleep. Follow up with your PCP for periodic monitoring.)       Follow up with Tereso Newcomer, PA-C. (10/31/12 at 8:50am)    Contact information:   [address] [place] [address] [phone]         Duration of Discharge Encounter: Greater than 30 minutes including physician and PA time.  Signed, Dayna Dunn PA-C 10/09/2012, 11:09 AM

## 2012-10-12 NOTE — ED Provider Notes (Signed)
Medical screening examination/treatment/procedure(s) were performed by non-physician practitioner and as supervising physician I was immediately available for consultation/collaboration.    Gabriela Giannelli L Sarita Hakanson, MD 10/12/12 0811 

## 2012-10-13 NOTE — Progress Notes (Signed)
Chaplain visited pt in E47 after a nurse tech told me that pt would be going to the cath lab. Turned out not to be code STEMI. Rather, he was going to cath lab to define his coronary anatomy in order to determine reason for his chest pain. Pt's wife and mother were present. 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 He was very much at peace, and I departed after a short visit.

## 2012-10-31 ENCOUNTER — Encounter: Payer: Self-pay | Admitting: Physician Assistant

## 2012-10-31 ENCOUNTER — Ambulatory Visit (INDEPENDENT_AMBULATORY_CARE_PROVIDER_SITE_OTHER): Payer: Medicare Other | Admitting: Physician Assistant

## 2012-10-31 VITALS — BP 138/84 | HR 63 | Ht 71.0 in | Wt 272.0 lb

## 2012-10-31 DIAGNOSIS — E785 Hyperlipidemia, unspecified: Secondary | ICD-10-CM

## 2012-10-31 DIAGNOSIS — I10 Essential (primary) hypertension: Secondary | ICD-10-CM | POA: Insufficient documentation

## 2012-10-31 DIAGNOSIS — G4733 Obstructive sleep apnea (adult) (pediatric): Secondary | ICD-10-CM

## 2012-10-31 DIAGNOSIS — I2510 Atherosclerotic heart disease of native coronary artery without angina pectoris: Secondary | ICD-10-CM

## 2012-10-31 DIAGNOSIS — I25119 Atherosclerotic heart disease of native coronary artery with unspecified angina pectoris: Secondary | ICD-10-CM | POA: Insufficient documentation

## 2012-10-31 DIAGNOSIS — E1169 Type 2 diabetes mellitus with other specified complication: Secondary | ICD-10-CM | POA: Insufficient documentation

## 2012-10-31 NOTE — Progress Notes (Signed)
[address] [place], [place]  [address] Phone: [phone] Fax:  [phone]  Date:  10/31/2012   ID:  Richard [name], DOB 1945/10/28, MRN [id number]  PCP:  No primary provider on file.  Cardiologist:  Dr. Delane Ginger     History of Present Illness: Richard [name] is a 67 y.o. male who returns for followup after recent admission to the hospital for chest pain.  He has a history of CAD, status post DES to the LAD in 04/2011, HTN, HL, GERD, sleep apnea.  He was admitted 8/3-8/4 after presenting with chest discomfort and syncope. Cardiac markers remained negative. Symptoms are similar to his previous presentation and cardiac catheterization was arranged.LHC 10/08/12: Proximal LAD stent patent with 30% ISR, normal circumflex and normal RCA, EF 60%.  Since discharge, no further chest pain. He does have chronic dyspnea with exertion. He describes NYHA class II-IIb symptoms. He denies significant LE edema. He denies further syncope. He has recently been diagnosed with sleep apnea (Dr. Randolm Idol in [place]).  He may be starting CPAP soon.  Labs (8/14):  K 4.1, Cr 1.38=>0.99, ALT 24, LDL 46, Hgb 14.1  Wt Readings from Last 3 Encounters:  10/08/12 262 lb 3.2 oz (118.933 kg)  10/08/12 262 lb 3.2 oz (118.933 kg)  10/18/11 272 lb (123.378 kg)     Past Medical History  Diagnosis Date  . Coronary artery disease     a. DES to LAD 04/2011 - took Effient/ASA x 1 yr, now on ASA only. b. Cath 10/2012: patent stent, otherwise normal cors.  . Hypertension   . Hyperlipidemia      . Shortness of breath     WITH EXERTION  . GERD (gastroesophageal reflux disease)     HAS HAD ESOPHAGUS STRETCHED SEVERAL TIMES IN THE PAST  . Right knee pain     TORN RIGHT KNEE MEDIAL MENSICAL TEAR  . Sleep apnea 10/18/11    STOP BANG SCORE OF 6  . Syncope     a. 10/2012.    Current Outpatient Prescriptions  Medication Sig Dispense Refill  . amLODipine (NORVASC) 5 MG tablet Take 5 mg by mouth  daily before breakfast.      . aspirin EC 81 MG tablet Take 81 mg by mouth daily.      . cholecalciferol (VITAMIN D) 1000 UNITS tablet Take 2,000 Units by mouth daily.       Marland Kitchen ibuprofen (ADVIL,MOTRIN) 200 MG tablet Take 600 mg by mouth every morning. pain      . metFORMIN (GLUCOPHAGE) 500 MG tablet Take 1 tablet (500 mg total) by mouth 2 (two) times daily with a meal.      . metoprolol succinate (TOPROL-XL) 25 MG 24 hr tablet Take 25 mg by mouth daily.      . nitroGLYCERIN (NITROSTAT) 0.4 MG SL tablet Place 1 tablet (0.4 mg total) under the tongue every 5 (five) minutes as needed for chest pain (up to 3 doses).  25 tablet  3  . Omega-3 Fatty Acids (OMEGA 3 PO) Take 1 capsule by mouth daily.      . pantoprazole (PROTONIX) 40 MG tablet Take 40 mg by mouth daily.      . pravastatin (PRAVACHOL) 20 MG tablet Take 20 mg by mouth daily.       No current facility-administered medications for this visit.    Allergies:   No Known Allergies  Social History:  The patient  reports that he has never smoked.  He has quit using smokeless tobacco. He reports that he does not drink alcohol or use illicit drugs.   ROS:  Please see the history of present illness.    All other systems reviewed and negative.   PHYSICAL EXAM: VS:  BP 138/84  Pulse 63  Ht 5\' 11"  (1.803 m)  Wt 272 lb (123.378 kg)  BMI 37.95 kg/m2 Well nourished, well developed, in no acute distress HEENT: normal Neck: no JVD Cardiac:  normal S1, S2; RRR; no murmur Lungs:  clear to auscultation bilaterally, no wheezing, rhonchi or rales Abd: soft, nontender, no hepatomegaly Ext: no edema; right wrist without hematoma or mass  Skin: warm and dry Neuro:  CNs 2-12 intact, no focal abnormalities noted  EKG:  NSR, HR 63, LAD, no change from prior tracing     ASSESSMENT AND PLAN:  1. CAD:  Recent LHC with patent stent and no obstruction in CFX or RCA.  No angina.  Continue ASA, statin. 2. Syncope:  This sounds vasovagal.  No further workup  needed. 3. Chest Pain:  Question if this was esophageal spasm.  Continue PPI and follow up with PCP for other causes. 4. Hypertension:  Borderline control.  Initiation of CPAP should help with control. 5. Hyperlipidemia:  Continue statin. 6. OSA:  He should start CPAP soon. 7. Disposition:  F/u with Dr. Delane Ginger in 3 mos.   Signed, Tereso Newcomer, PA-C  10/31/2012 8:34 AM

## 2012-10-31 NOTE — Patient Instructions (Addendum)
NO CHANGES WERE MADE TODAY WITH YOUR MEDICATIONS  PLEASE FOLLOW UP WITH DR. Elease Hashimoto 01/22/13 @ 10:30

## 2012-11-15 DIAGNOSIS — I5032 Chronic diastolic (congestive) heart failure: Secondary | ICD-10-CM | POA: Insufficient documentation

## 2013-01-22 ENCOUNTER — Ambulatory Visit: Payer: Medicare Other | Admitting: Cardiovascular Disease

## 2013-01-24 ENCOUNTER — Encounter: Payer: Self-pay | Admitting: Cardiovascular Disease

## 2014-02-14 ENCOUNTER — Encounter (HOSPITAL_COMMUNITY): Payer: Self-pay | Admitting: Cardiovascular Disease

## 2015-02-03 ENCOUNTER — Encounter: Payer: Self-pay | Admitting: Internal Medicine

## 2016-03-08 HISTORY — PX: LAPAROSCOPIC CHOLECYSTECTOMY: SUR755

## 2016-04-21 DIAGNOSIS — I219 Acute myocardial infarction, unspecified: Secondary | ICD-10-CM

## 2016-04-21 HISTORY — DX: Acute myocardial infarction, unspecified: I21.9

## 2016-05-06 HISTORY — PX: ESOPHAGOGASTRODUODENOSCOPY: SHX1529

## 2016-05-14 LAB — HM COLONOSCOPY

## 2017-06-06 HISTORY — PX: CARDIAC CATHETERIZATION: SHX172

## 2017-06-14 DIAGNOSIS — Z955 Presence of coronary angioplasty implant and graft: Secondary | ICD-10-CM | POA: Insufficient documentation

## 2018-05-07 DIAGNOSIS — E118 Type 2 diabetes mellitus with unspecified complications: Secondary | ICD-10-CM

## 2018-05-07 HISTORY — DX: Type 2 diabetes mellitus with unspecified complications: E11.8

## 2018-05-19 ENCOUNTER — Encounter: Payer: Self-pay | Admitting: Family Medicine

## 2018-05-19 ENCOUNTER — Other Ambulatory Visit: Payer: Self-pay

## 2018-05-19 ENCOUNTER — Encounter (HOSPITAL_BASED_OUTPATIENT_CLINIC_OR_DEPARTMENT_OTHER): Payer: Self-pay

## 2018-05-19 ENCOUNTER — Inpatient Hospital Stay (HOSPITAL_BASED_OUTPATIENT_CLINIC_OR_DEPARTMENT_OTHER)
Admission: EM | Admit: 2018-05-19 | Discharge: 2018-05-22 | DRG: 247 | Disposition: A | Discharge status: Home / Self Care | Payer: HMO | Attending: Cardiology | Admitting: Cardiology

## 2018-05-19 ENCOUNTER — Emergency Department (HOSPITAL_BASED_OUTPATIENT_CLINIC_OR_DEPARTMENT_OTHER): Payer: HMO

## 2018-05-19 ENCOUNTER — Ambulatory Visit (INDEPENDENT_AMBULATORY_CARE_PROVIDER_SITE_OTHER): Payer: HMO | Admitting: Family Medicine

## 2018-05-19 VITALS — BP 159/93 | HR 79 | Temp 97.9°F | Resp 16 | Ht 68.0 in | Wt 268.8 lb

## 2018-05-19 DIAGNOSIS — Z955 Presence of coronary angioplasty implant and graft: Secondary | ICD-10-CM

## 2018-05-19 DIAGNOSIS — K219 Gastro-esophageal reflux disease without esophagitis: Secondary | ICD-10-CM | POA: Diagnosis present

## 2018-05-19 DIAGNOSIS — E781 Pure hyperglyceridemia: Secondary | ICD-10-CM

## 2018-05-19 DIAGNOSIS — I25110 Atherosclerotic heart disease of native coronary artery with unstable angina pectoris: Secondary | ICD-10-CM | POA: Diagnosis present

## 2018-05-19 DIAGNOSIS — I252 Old myocardial infarction: Secondary | ICD-10-CM | POA: Diagnosis not present

## 2018-05-19 DIAGNOSIS — N289 Disorder of kidney and ureter, unspecified: Secondary | ICD-10-CM | POA: Diagnosis not present

## 2018-05-19 DIAGNOSIS — R079 Chest pain, unspecified: Secondary | ICD-10-CM | POA: Diagnosis not present

## 2018-05-19 DIAGNOSIS — T82855A Stenosis of coronary artery stent, initial encounter: Secondary | ICD-10-CM | POA: Diagnosis not present

## 2018-05-19 DIAGNOSIS — E876 Hypokalemia: Secondary | ICD-10-CM | POA: Diagnosis not present

## 2018-05-19 DIAGNOSIS — E7800 Pure hypercholesterolemia, unspecified: Secondary | ICD-10-CM | POA: Diagnosis not present

## 2018-05-19 DIAGNOSIS — I249 Acute ischemic heart disease, unspecified: Secondary | ICD-10-CM | POA: Diagnosis present

## 2018-05-19 DIAGNOSIS — E1142 Type 2 diabetes mellitus with diabetic polyneuropathy: Secondary | ICD-10-CM | POA: Diagnosis not present

## 2018-05-19 DIAGNOSIS — E1169 Type 2 diabetes mellitus with other specified complication: Secondary | ICD-10-CM | POA: Diagnosis present

## 2018-05-19 DIAGNOSIS — Z6841 Body Mass Index (BMI) 40.0 and over, adult: Secondary | ICD-10-CM

## 2018-05-19 DIAGNOSIS — Z7982 Long term (current) use of aspirin: Secondary | ICD-10-CM | POA: Diagnosis not present

## 2018-05-19 DIAGNOSIS — E119 Type 2 diabetes mellitus without complications: Secondary | ICD-10-CM | POA: Diagnosis not present

## 2018-05-19 DIAGNOSIS — I2510 Atherosclerotic heart disease of native coronary artery without angina pectoris: Secondary | ICD-10-CM | POA: Diagnosis not present

## 2018-05-19 DIAGNOSIS — Z9861 Coronary angioplasty status: Secondary | ICD-10-CM

## 2018-05-19 DIAGNOSIS — I10 Essential (primary) hypertension: Secondary | ICD-10-CM | POA: Diagnosis not present

## 2018-05-19 DIAGNOSIS — I200 Unstable angina: Secondary | ICD-10-CM | POA: Diagnosis present

## 2018-05-19 DIAGNOSIS — I259 Chronic ischemic heart disease, unspecified: Secondary | ICD-10-CM

## 2018-05-19 DIAGNOSIS — I110 Hypertensive heart disease with heart failure: Secondary | ICD-10-CM | POA: Diagnosis not present

## 2018-05-19 DIAGNOSIS — E785 Hyperlipidemia, unspecified: Secondary | ICD-10-CM | POA: Diagnosis present

## 2018-05-19 DIAGNOSIS — I5032 Chronic diastolic (congestive) heart failure: Secondary | ICD-10-CM | POA: Diagnosis present

## 2018-05-19 DIAGNOSIS — E669 Obesity, unspecified: Secondary | ICD-10-CM | POA: Diagnosis present

## 2018-05-19 DIAGNOSIS — G4733 Obstructive sleep apnea (adult) (pediatric): Secondary | ICD-10-CM | POA: Diagnosis present

## 2018-05-19 DIAGNOSIS — N179 Acute kidney failure, unspecified: Secondary | ICD-10-CM | POA: Diagnosis present

## 2018-05-19 DIAGNOSIS — Z79899 Other long term (current) drug therapy: Secondary | ICD-10-CM | POA: Diagnosis not present

## 2018-05-19 DIAGNOSIS — I959 Hypotension, unspecified: Secondary | ICD-10-CM | POA: Diagnosis not present

## 2018-05-19 DIAGNOSIS — Y831 Surgical operation with implant of artificial internal device as the cause of abnormal reaction of the patient, or of later complication, without mention of misadventure at the time of the procedure: Secondary | ICD-10-CM | POA: Diagnosis present

## 2018-05-19 DIAGNOSIS — Z7984 Long term (current) use of oral hypoglycemic drugs: Secondary | ICD-10-CM

## 2018-05-19 DIAGNOSIS — Z981 Arthrodesis status: Secondary | ICD-10-CM

## 2018-05-19 HISTORY — DX: Acute myocardial infarction, unspecified: I21.9

## 2018-05-19 LAB — CBC WITH DIFFERENTIAL/PLATELET
Abs Immature Granulocytes: 0.03 10*3/uL (ref 0.00–0.07)
Basophils Absolute: 0 10*3/uL (ref 0.0–0.1)
Basophils Relative: 0 %
Eosinophils Absolute: 0.2 10*3/uL (ref 0.0–0.5)
Eosinophils Relative: 3 %
HCT: 41.5 % (ref 39.0–52.0)
Hemoglobin: 14.4 g/dL (ref 13.0–17.0)
Immature Granulocytes: 0 %
Lymphocytes Relative: 35 %
Lymphs Abs: 2.6 10*3/uL (ref 0.7–4.0)
MCH: 32.7 pg (ref 26.0–34.0)
MCHC: 34.7 g/dL (ref 30.0–36.0)
MCV: 94.3 fL (ref 80.0–100.0)
Monocytes Absolute: 0.6 10*3/uL (ref 0.1–1.0)
Monocytes Relative: 8 %
NRBC: 0 % (ref 0.0–0.2)
Neutro Abs: 4 10*3/uL (ref 1.7–7.7)
Neutrophils Relative %: 54 %
Platelets: 185 10*3/uL (ref 150–400)
RBC: 4.4 MIL/uL (ref 4.22–5.81)
RDW: 13.1 % (ref 11.5–15.5)
WBC: 7.6 10*3/uL (ref 4.0–10.5)

## 2018-05-19 LAB — TROPONIN I

## 2018-05-19 LAB — COMPREHENSIVE METABOLIC PANEL
ALT: 7 U/L (ref 0–44)
AST: 36 U/L (ref 15–41)
Albumin: 3.7 g/dL (ref 3.5–5.0)
Alkaline Phosphatase: 56 U/L (ref 38–126)
Anion gap: 9 (ref 5–15)
BUN: 10 mg/dL (ref 8–23)
CALCIUM: 8.9 mg/dL (ref 8.9–10.3)
CO2: 25 mmol/L (ref 22–32)
Chloride: 104 mmol/L (ref 98–111)
Creatinine, Ser: 0.95 mg/dL (ref 0.61–1.24)
GFR calc Af Amer: >60 mL/min (ref >60)
GFR calc non Af Amer: >60 mL/min (ref >60)
Glucose, Bld: 112 mg/dL — ABNORMAL HIGH (ref 70–99)
Potassium: 4.4 mmol/L (ref 3.5–5.1)
Sodium: 138 mmol/L (ref 135–145)
Total Bilirubin: 1.6 mg/dL — ABNORMAL HIGH (ref 0.3–1.2)
Total Protein: 5.8 g/dL — ABNORMAL LOW (ref 6.5–8.1)

## 2018-05-19 LAB — T4, FREE: Free T4: 0.87 ng/dL (ref 0.82–1.77)

## 2018-05-19 LAB — CBC
HCT: 46.8 % (ref 39.0–52.0)
HEMOGLOBIN: 15.1 g/dL (ref 13.0–17.0)
MCH: 31.7 pg (ref 26.0–34.0)
MCHC: 32.3 g/dL (ref 30.0–36.0)
MCV: 98.1 fL (ref 80.0–100.0)
NRBC: 0 % (ref 0.0–0.2)
PLATELETS: 202 10*3/uL (ref 150–400)
RBC: 4.77 MIL/uL (ref 4.22–5.81)
RDW: 13.1 % (ref 11.5–15.5)
WBC: 8 10*3/uL (ref 4.0–10.5)

## 2018-05-19 LAB — BASIC METABOLIC PANEL
Anion gap: 8 (ref 5–15)
BUN: 12 mg/dL (ref 8–23)
CALCIUM: 9.1 mg/dL (ref 8.9–10.3)
CO2: 27 mmol/L (ref 22–32)
Chloride: 103 mmol/L (ref 98–111)
Creatinine, Ser: 0.93 mg/dL (ref 0.61–1.24)
GFR calc Af Amer: >60 mL/min (ref >60)
GLUCOSE: 121 mg/dL — AB (ref 70–99)
POTASSIUM: 3.6 mmol/L (ref 3.5–5.1)
Sodium: 138 mmol/L (ref 135–145)

## 2018-05-19 LAB — BRAIN NATRIURETIC PEPTIDE: B Natriuretic Peptide: 26.2 pg/mL (ref 0.0–100.0)

## 2018-05-19 LAB — GLUCOSE, CAPILLARY: Glucose-Capillary: 114 mg/dL — ABNORMAL HIGH (ref 70–99)

## 2018-05-19 LAB — TSH: TSH: 2.823 u[IU]/mL (ref 0.350–4.500)

## 2018-05-19 MED ORDER — VITAMIN D 1000 UNITS PO TABS: 2000.0000 [IU] | ORAL_TABLET | Freq: Every day | ORAL | Status: DC

## 2018-05-19 MED ORDER — METOPROLOL SUCCINATE ER 25 MG PO TB24
25.0000 mg | ORAL_TABLET | Freq: Every day | ORAL | Status: DC
  Administered 2018-05-19 – 2018-05-22 (×4): 25 mg via ORAL
  Filled 2018-05-19 (×4): qty 1

## 2018-05-19 MED ORDER — LISINOPRIL 20 MG PO TABS
20.0000 mg | ORAL_TABLET | Freq: Every day | ORAL | Status: DC
  Administered 2018-05-20: 20 mg via ORAL
  Filled 2018-05-19 (×2): qty 1

## 2018-05-19 MED ORDER — ACETAMINOPHEN 325 MG PO TABS
650.0000 mg | ORAL_TABLET | ORAL | Status: DC | PRN
  Administered 2018-05-20: 650 mg via ORAL
  Filled 2018-05-19: qty 2

## 2018-05-19 MED ORDER — ASPIRIN EC 81 MG PO TBEC
81.0000 mg | DELAYED_RELEASE_TABLET | Freq: Every day | ORAL | Status: DC
  Administered 2018-05-19 – 2018-05-21 (×3): 81 mg via ORAL
  Filled 2018-05-19 (×3): qty 1

## 2018-05-19 MED ORDER — NITROGLYCERIN 0.2 MG/HR TD PT24
0.2000 mg | MEDICATED_PATCH | Freq: Every day | TRANSDERMAL | Status: DC
  Administered 2018-05-19 – 2018-05-22 (×4): 0.2 mg via TRANSDERMAL
  Filled 2018-05-19 (×5): qty 1

## 2018-05-19 MED ORDER — HEPARIN (PORCINE) 25000 UT/250ML-% IV SOLN
1600.0000 [IU]/h | INTRAVENOUS | Status: DC
  Administered 2018-05-19: 1200 [IU]/h via INTRAVENOUS
  Administered 2018-05-20 – 2018-05-21 (×2): 1350 [IU]/h via INTRAVENOUS
  Filled 2018-05-19 (×4): qty 250

## 2018-05-19 MED ORDER — NITROGLYCERIN 0.4 MG SL SUBL: 0.4000 mg | SUBLINGUAL_TABLET | SUBLINGUAL | Status: DC | PRN

## 2018-05-19 MED ORDER — ONDANSETRON HCL 4 MG/2ML IJ SOLN: 4.0000 mg | Freq: Four times a day (QID) | INTRAMUSCULAR | Status: DC | PRN

## 2018-05-19 MED ORDER — VITAMIN D 25 MCG (1000 UNIT) PO TABS
2000.0000 [IU] | ORAL_TABLET | Freq: Every day | ORAL | Status: DC
  Administered 2018-05-20 – 2018-05-22 (×3): 2000 [IU] via ORAL
  Filled 2018-05-19 (×4): qty 2

## 2018-05-19 MED ORDER — GABAPENTIN 300 MG PO CAPS
300.0000 mg | ORAL_CAPSULE | Freq: Every day | ORAL | Status: DC
  Administered 2018-05-19 – 2018-05-21 (×3): 300 mg via ORAL
  Filled 2018-05-19 (×3): qty 1

## 2018-05-19 MED ORDER — HEPARIN BOLUS VIA INFUSION
4000.0000 [IU] | Freq: Once | INTRAVENOUS | Status: AC
  Administered 2018-05-19: 4000 [IU] via INTRAVENOUS

## 2018-05-19 MED ORDER — FUROSEMIDE 40 MG PO TABS
40.0000 mg | ORAL_TABLET | Freq: Every day | ORAL | Status: DC
  Administered 2018-05-19 – 2018-05-21 (×3): 40 mg via ORAL
  Filled 2018-05-19 (×3): qty 1

## 2018-05-19 MED ORDER — NITROGLYCERIN 0.4 MG SL SUBL
0.4000 mg | SUBLINGUAL_TABLET | SUBLINGUAL | Status: AC | PRN
  Administered 2018-05-19 (×3): 0.4 mg via SUBLINGUAL
  Filled 2018-05-19: qty 1

## 2018-05-19 MED ORDER — SIMVASTATIN 20 MG PO TABS
40.0000 mg | ORAL_TABLET | Freq: Every day | ORAL | Status: DC
  Administered 2018-05-20: 40 mg via ORAL
  Filled 2018-05-19: qty 2

## 2018-05-19 MED ORDER — PANTOPRAZOLE SODIUM 40 MG PO TBEC
40.0000 mg | DELAYED_RELEASE_TABLET | Freq: Every day | ORAL | Status: DC
  Administered 2018-05-20 – 2018-05-22 (×3): 40 mg via ORAL
  Filled 2018-05-19 (×3): qty 1

## 2018-05-19 MED ORDER — SODIUM CHLORIDE 0.9% FLUSH
3.0000 mL | Freq: Once | INTRAVENOUS | Status: DC
  Filled 2018-05-19: qty 3

## 2018-05-19 MED ORDER — AMLODIPINE BESYLATE 5 MG PO TABS
5.0000 mg | ORAL_TABLET | Freq: Every day | ORAL | Status: DC
  Administered 2018-05-19 – 2018-05-22 (×4): 5 mg via ORAL
  Filled 2018-05-19 (×4): qty 1

## 2018-05-19 NOTE — H&P (Signed)
H and P    Patient ID: BARAA TUBBS MRN: [id number], DOB/AGE: [birth year]   Admit date: 05/19/2018 Date of Consult: 05/19/2018  Primary Physician: Tammi Sou, MD Primary Cardiologist: No primary care provider on file.   Patient Profile    Richard [name] is a 73 y.o. male with a history of CAD, who is being seen today for the evaluation of CP  Past Medical History   Past Medical History:  Diagnosis Date  . Coronary artery disease    a. DES to LAD 04/2011 - took Effient/ASA x 1 yr, now on ASA only. b. Cath 10/2012: patent stent, otherwise normal cors.  . Diabetic peripheral neuropathy ([place])   . GERD (gastroesophageal reflux disease)    HAS HAD ESOPHAGUS STRETCHED SEVERAL TIMES IN THE PAST  . Hyperlipidemia      . Hypertension   . Myocardial infarction ([place])   . OSA on CPAP 10/18/2011   doesn't wear it  . Prediabetes   . Right knee pain    TORN RIGHT KNEE MEDIAL MENSICAL TEAR  . Shortness of breath    WITH EXERTION  . Syncope    a. 10/2012.    Past Surgical History:  Procedure Laterality Date  . CARDIAC CATHETERIZATION  06/2017   In-stent restenosis cleared out  . CARDIOVASCULAR STRESS TEST     Latter part of 2019--normal per cardiologist's note from 10/2017  . CERVICAL FUSION  1990 and 1993   X 2   SURGERIES    SLIGHT LIMITATION ROM  . COLONOSCOPY  11/24/2004; 2018   NORMAL.  Recall 2016.  Repeat 2018 normal per pt.  . CORONARY ANGIOPLASTY WITH STENT PLACEMENT    . KNEE ARTHROSCOPY  10/22/2011   Procedure: ARTHROSCOPY KNEE;  Surgeon: Gearlean Alf, MD;  Location: [facility];  Service: Orthopedics;  Laterality: Right;  Right knee scope with debridement  . LAPAROSCOPIC CHOLECYSTECTOMY  2018  . LEFT HEART CATHETERIZATION WITH CORONARY ANGIOGRAM N/A 10/08/2012   Procedure: LEFT HEART CATHETERIZATION WITH CORONARY ANGIOGRAM;  Surgeon: Lorretta Harp, MD;  Location: [facility] CATH LAB;  Service: Cardiovascular;  Laterality: N/A;  . NASAL SINUS SURGERY     ONE SINUS SURGERY  THRU NOSE AND ANOTHER SINUS SURGERY THRU INCISION ABOVE RT EYE     Allergies  No Known Allergies  History of Present Illness    46 y o man with a PMH of HTN, CAD s/p PCI in 2013 (LAD). Repeat Cath in 2014 showed a patent stent. Was on effient as opposed to brillanta which caused him a headache. Also with DM. Reports for 1 m chest pressure. Pain similar to prior episode years ago. Pain at rest and with mild activity. Had to take nitro last 2 weeks. Came in today because he saw a new PCP and was advised to come since this was the first time he told anyone about the pain. Has DOE with minimal walking. No LEE, or PND. 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   No alcohol or tobacco. Lives with wife..   Inpatient Medications    . amLODipine  5 mg Oral Daily  . aspirin EC  81 mg Oral Daily  . cholecalciferol  2,000 Units Oral Daily  . furosemide  40 mg Oral Daily  . gabapentin  300 mg Oral QHS  . lisinopril  20 mg Oral Daily  . metoprolol succinate  25 mg Oral Daily  . pantoprazole  40 mg Oral Daily  . [  START ON 05/20/2018] simvastatin  40 mg Oral q1800  . sodium chloride flush  3 mL Intravenous Once    Family History    Family History  Problem Relation Age of Onset  . Arthritis Mother   . Cancer Father    He indicated that his mother is alive. He indicated that his father is deceased.   Social History    Social History   Socioeconomic History  . Marital status: Married    Spouse name: [name]  . Number of children: Not on file  . Years of education: Not on file  . Highest education level: Not on file  Occupational History  . Not on file  Social Needs  . Financial resource strain: Not on file  . Food insecurity:    Worry: Not on file    Inability: Not on file  . Transportation needs:    Medical: Not on file    Non-medical: Not on file  Tobacco Use  . Smoking status: Never Smoker  . Smokeless tobacco: Former Systems developer    Types: Chew  Substance and  Sexual Activity  . Alcohol use: No  . Drug use: No  . Sexual activity: Not on file  Lifestyle  . Physical activity:    Days per week: Not on file    Minutes per session: Not on file  . Stress: Not on file  Relationships  . Social connections:    Talks on phone: Not on file    Gets together: Not on file    Attends religious service: Not on file    Active member of club or organization: Not on file    Attends meetings of clubs or organizations: Not on file    Relationship status: Not on file  . Intimate partner violence:    Fear of current or ex partner: Not on file    Emotionally abused: Not on file    Physically abused: Not on file    Forced sexual activity: Not on file  Other Topics Concern  . Not on file  Social History Narrative   Married, 2 daughters.  2 other daughters deceased.   Educ: HS   Occup: farmer, retired from [employer], Theme park manager at [employer].   No T/A/Ds     Review of Systems    General:  No chills, fever, night sweats or weight changes.  Cardiovascular:  No chest pain, dyspnea on exertion, edema, orthopnea, palpitations, paroxysmal nocturnal dyspnea. Dermatological: No rash, lesions/masses Respiratory: No cough, dyspnea Urologic: No hematuria, dysuria Abdominal:   No nausea, vomiting, diarrhea, bright red blood per rectum, melena, or hematemesis Neurologic:  No visual changes, wkns, changes in mental status. All other systems reviewed and are otherwise negative except as noted above.  Physical Exam    Blood pressure (!) 186/100, pulse 75, temperature 98 F (36.7 C), temperature source Oral, resp. rate 18, height 5\' 8"  (1.727 m), weight 121.6 kg, SpO2 95 %.  General: Pleasant, NAD Psych: Normal affect. Neuro: Alert and oriented X 3. Moves all extremities spontaneously. HEENT: Normal  Neck: Supple without bruits or JVD. Lungs:  Resp regular and unlabored, CTA. Heart: RRR no s3, s4, or murmurs. Abdomen: Soft, non-tender, non-distended, BS + x 4.   Extremities: No clubbing, cyanosis or edema. DP/PT/Radials 2+ and equal bilaterally.  Labs    Troponin (Point of Care Test) No results for input(s): TROPIPOC in the last 72 hours. Recent Labs    05/19/18 1549  TROPONINI <0.03   Lab Results  Component  Value Date   WBC 8.0 05/19/2018   HGB 15.1 05/19/2018   HCT 46.8 05/19/2018   MCV 98.1 05/19/2018   PLT 202 05/19/2018    Recent Labs  Lab 05/19/18 1549  NA 138  K 3.6  CL 103  CO2 27  BUN 12  CREATININE 0.93  CALCIUM 9.1  GLUCOSE 121*   Lab Results  Component Value Date   CHOL 117 10/09/2012   HDL 32 (L) 10/09/2012   LDLCALC 46 10/09/2012   TRIG 194 (H) 10/09/2012   No results found for: [facility]   Radiology Studies    Dg Chest 2 View  Result Date: 05/19/2018 CLINICAL DATA:  73 year old male with acute chest pain EXAM: CHEST - 2 VIEW COMPARISON:  10/08/2012 FINDINGS: The cardiomediastinal silhouette is unremarkable. There is no evidence of focal airspace disease, pulmonary edema, suspicious pulmonary nodule/mass, pleural effusion, or pneumothorax. No acute bony abnormalities are identified. Cervical fusion changes again noted. IMPRESSION: No active cardiopulmonary disease. Electronically Signed   By: Margarette Canada M.D.   On: 05/19/2018 16:04    ECG & Cardiac Imaging    NSRT no evidence of ischemia - personally reviewed.  Assessment & Plan    Unstable angina: Trop neg x3. Known history of MI and PCI to LAD in 2013. NO coronary eval since 2014. IN active CP. Hypertensive. Will attempt to control BP.   Plan: - Nitro patch - restart home meds incl ASA, BB and ACEI - Hold metformin - Basic labs - Repeat ECG - Heparin - Tentative plan for LHC on Monday   Signed, Cristina Gong, MD 05/19/2018, 9:19 PM  For questions or updates, please contact   Please consult [email or web address] for contact info under Cardiology/STEMI.

## 2018-05-19 NOTE — ED Notes (Signed)
Heparin drip verified with Macky Lower RN

## 2018-05-19 NOTE — ED Notes (Signed)
ED Provider at bedside. 

## 2018-05-19 NOTE — Progress Notes (Signed)
ANTICOAGULATION CONSULT NOTE - Initial Consult  Pharmacy Consult for heparin Indication: chest pain/ACS  No Known Allergies  Patient Measurements: Height: 5\' 8"  (172.7 cm) Weight: 268 lb (121.6 kg) IBW/kg (Calculated) : 68.4 Heparin Dosing Weight: 96.3 kg  Vital Signs: Temp: 98 F (36.7 C) (03/13 1535) Temp Source: Oral (03/13 1535) BP: 139/80 (03/13 1800) Pulse Rate: 73 (03/13 1800)  Labs: Recent Labs    05/19/18 1549  HGB 15.1  HCT 46.8  PLT 202  CREATININE 0.93  TROPONINI <0.03    Estimated Creatinine Clearance: 92.4 mL/min (by C-G formula based on SCr of 0.93 mg/dL).   Medical History: Past Medical History:  Diagnosis Date  . Coronary artery disease    a. DES to LAD 04/2011 - took Effient/ASA x 1 yr, now on ASA only. b. Cath 10/2012: patent stent, otherwise normal cors.  . Diabetic peripheral neuropathy ([place])   . GERD (gastroesophageal reflux disease)    HAS HAD ESOPHAGUS STRETCHED SEVERAL TIMES IN THE PAST  . Hyperlipidemia      . Hypertension   . Myocardial infarction ([place])   . OSA on CPAP 10/18/2011   doesn't wear it  . Prediabetes   . Right knee pain    TORN RIGHT KNEE MEDIAL MENSICAL TEAR  . Shortness of breath    WITH EXERTION  . Syncope    a. 10/2012.    Assessment: 76 yoM admitted 3/13 with complaints of chest pain. Pharmacy has been consulted for heparin dosing. Patient not on anticoagulation PTA. Patient's baseline CBC stable, Hgb 15.1, HCT 46.8, and pltc 202.   Goal of Therapy:  Heparin level 0.3-0.7 units/ml Monitor platelets by anticoagulation protocol: Yes   Plan:  Give 4000 units bolus x 1 Start heparin infusion at 1200 units/hr Check anti-Xa level in 8 hours and daily while on heparin Continue to monitor H&H and platelets  Thank you for allowing pharmacy to be a part of this patient's care.  Leron Croak, PharmD PGY1 Pharmacy Resident  Please check AMION for all [facility] Pharmacy phone numbers 05/19/2018,6:07 PM

## 2018-05-19 NOTE — Progress Notes (Signed)
Office Note 05/19/2018  CC:  Chief Complaint  Patient presents with  . Establish Care    Previous PCP, [name].    HPI:  Richard [name] is a 73 y.o. male who presents accompanied by his wife and is here to establish care and discuss chest complaints. Patient's most recent primary MD: see above.  Had to switch due to insurance reasons. Old records in EPIC/HL EMR were reviewed prior to or during today's visit.  Over the last month:   Has had heaviness in chest with feeling SOB, occurs at rest and/or with getting up and walking around. Feels nauseated but no vomiting when this happens.  No arm pain.  No diaphoresis. This is the way he felt before when his coronary stent had restenosed. He climbs up 10 steps and gets very winded and fatigued and when stops he takes 5 min to feel back to normal. TAking nitroglyc more frequently lately, occ has to take 2 NTG to get sx's to stop. He currently feels the discomfort in his chest now. He has GERD intermittently but states it feels different than this. He is quite hard to keep on topic, doesn't give direct or clear answers to some of my questions.    ROS: no orthopnea or PND.  No change in LE swelling.  No fever, no cough.  Past Medical History:  Diagnosis Date  . Coronary artery disease    a. DES to LAD 04/2011 - took Effient/ASA x 1 yr, now on ASA only. b. Cath 10/2012: patent stent, otherwise normal cors.  . Diabetic peripheral neuropathy ([place])   . GERD (gastroesophageal reflux disease)    HAS HAD ESOPHAGUS STRETCHED SEVERAL TIMES IN THE PAST  . Hyperlipidemia      . Hypertension   . Myocardial infarction ([place])   . OSA on CPAP 10/18/2011   doesn't wear it  . Prediabetes   . Right knee pain    TORN RIGHT KNEE MEDIAL MENSICAL TEAR  . Shortness of breath    WITH EXERTION  . Syncope    a. 10/2012.    Past Surgical History:  Procedure Laterality Date  . CERVICAL FUSION  1990 and 1993   X 2   SURGERIES     SLIGHT LIMITATION ROM  . COLONOSCOPY  11/24/2004   NORMAL.  Recall 2016.  Marland Kitchen CORONARY ANGIOPLASTY WITH STENT PLACEMENT    . KNEE ARTHROSCOPY  10/22/2011   Procedure: ARTHROSCOPY KNEE;  Surgeon: Gearlean Alf, MD;  Location: [facility];  Service: Orthopedics;  Laterality: Right;  Right knee scope with debridement  . LAPAROSCOPIC CHOLECYSTECTOMY  2018  . LEFT HEART CATHETERIZATION WITH CORONARY ANGIOGRAM N/A 10/08/2012   Procedure: LEFT HEART CATHETERIZATION WITH CORONARY ANGIOGRAM;  Surgeon: Lorretta Harp, MD;  Location: [facility] CATH LAB;  Service: Cardiovascular;  Laterality: N/A;  . NASAL SINUS SURGERY     ONE SINUS SURGERY THRU NOSE AND ANOTHER SINUS SURGERY THRU INCISION ABOVE RT EYE    Family History  Problem Relation Age of Onset  . Arthritis Mother   . Cancer Father     Social History   Socioeconomic History  . Marital status: Married    Spouse name: [name]  . Number of children: Not on file  . Years of education: Not on file  . Highest education level: Not on file  Occupational History  . Not on file  Social Needs  . Financial resource strain: Not on file  . Food insecurity:  Worry: Not on file    Inability: Not on file  . Transportation needs:    Medical: Not on file    Non-medical: Not on file  Tobacco Use  . Smoking status: Never Smoker  . Smokeless tobacco: Former Systems developer    Types: Chew  Substance and Sexual Activity  . Alcohol use: No  . Drug use: No  . Sexual activity: Not on file  Lifestyle  . Physical activity:    Days per week: Not on file    Minutes per session: Not on file  . Stress: Not on file  Relationships  . Social connections:    Talks on phone: Not on file    Gets together: Not on file    Attends religious service: Not on file    Active member of club or organization: Not on file    Attends meetings of clubs or organizations: Not on file    Relationship status: Not on file  . Intimate partner violence:    Fear of current or ex partner: Not  on file    Emotionally abused: Not on file    Physically abused: Not on file    Forced sexual activity: Not on file  Other Topics Concern  . Not on file  Social History Narrative   Married, 2 daughters.  2 other daughters deceased.   Educ: HS   Occup: farmer, retired from [employer], Theme park manager at [employer].   No T/A/Ds    No facility-administered encounter medications on file as of 05/19/2018.    Outpatient Encounter Medications as of 05/19/2018  Medication Sig  . amLODipine (NORVASC) 5 MG tablet Take by mouth.  Marland Kitchen aspirin EC 81 MG tablet Take 81 mg by mouth daily.  . cholecalciferol (VITAMIN D) 1000 UNITS tablet Take 2,000 Units by mouth daily.   . furosemide (LASIX) 40 MG tablet TAKE 1 TABLET BY MOUTH EVERY DAY  . gabapentin (NEURONTIN) 300 MG capsule Take by mouth.  Marland Kitchen ibuprofen (ADVIL,MOTRIN) 200 MG tablet Take 600 mg by mouth every morning. pain  . lisinopril (PRINIVIL,ZESTRIL) 20 MG tablet Take 20 mg by mouth 2 (two) times daily. Patient takes once daily.  . metFORMIN (GLUCOPHAGE) 500 MG tablet Take 1 tablet (500 mg total) by mouth 2 (two) times daily with a meal.  . metoprolol succinate (TOPROL-XL) 25 MG 24 hr tablet Take 25 mg by mouth daily.  . nitroGLYCERIN (NITROSTAT) 0.4 MG SL tablet Place 1 tablet (0.4 mg total) under the tongue every 5 (five) minutes as needed for chest pain (up to 3 doses).  . pantoprazole (PROTONIX) 40 MG tablet Take 40 mg by mouth daily.  . simvastatin (ZOCOR) 20 MG tablet Take by mouth.  Marland Kitchen lisinopril (PRINIVIL,ZESTRIL) 20 MG tablet 20 mg. Take 1 tablet (20 mg total) by mouth daily.  . Omega-3 Fatty Acids (OMEGA 3 PO) Take 1 capsule by mouth daily.  . [DISCONTINUED] atorvastatin (LIPITOR) 40 MG tablet 40 mg. Take 1 tablet (40 mg total) by mouth daily.    No Known Allergies  ROS: see HPI, plus- Review of Systems  Constitutional: Negative for fatigue and fever.  HENT: Negative for congestion and sore throat.   Eyes: Negative for visual  disturbance.  Respiratory: Positive for chest tightness and shortness of breath. Negative for cough.   Cardiovascular: Positive for chest pain.  Gastrointestinal: Negative for abdominal pain and nausea.  Genitourinary: Negative for dysuria.  Musculoskeletal: Negative for back pain and joint swelling.  Skin: Negative for rash.  Neurological: Negative  for weakness and headaches.  Hematological: Negative for adenopathy.    PE; Blood pressure (!) 159/93, pulse 79, temperature 97.9 F (36.6 C), temperature source Oral, resp. rate 16, height 5\' 8"  (1.727 m), weight 268 lb 12.8 oz (121.9 kg), SpO2 94 %. Body mass index is 40.87 kg/m.  Gen: Alert, well appearing.  Patient is oriented to person, place, time, and situation. AFFECT: pleasant, lucid thought and speech. AGT:XMIW: no injection, icteris, swelling, or exudate.  EOMI, PERRLA. Mouth: lips without lesion/swelling.  Oral mucosa pink and moist. Oropharynx without erythema, exudate, or swelling. Neck - No masses or thyromegaly or limitation in range of motion  CV: RRR, no m/r/g.   LUNGS: CTA bilat, nonlabored resps, good aeration in all lung fields. ABD: soft, NT/ND EXT: no clubbing or cyanosis.  no edema.   Pertinent labs:  No results found for: TSH Lab Results  Component Value Date   WBC 8.0 05/19/2018   HGB 15.1 05/19/2018   HCT 46.8 05/19/2018   MCV 98.1 05/19/2018   PLT 202 05/19/2018   Lab Results  Component Value Date   CREATININE 0.93 05/19/2018   BUN 12 05/19/2018   NA 138 05/19/2018   K 3.6 05/19/2018   CL 103 05/19/2018   CO2 27 05/19/2018   Lab Results  Component Value Date   ALT 24 10/08/2012   AST 24 10/08/2012   ALKPHOS 85 10/08/2012   BILITOT 0.4 10/08/2012   Lab Results  Component Value Date   CHOL 117 10/09/2012   Lab Results  Component Value Date   HDL 32 (L) 10/09/2012   Lab Results  Component Value Date   LDLCALC 46 10/09/2012   Lab Results  Component Value Date   TRIG 194 (H)  10/09/2012   Lab Results  Component Value Date   CHOLHDL 3.7 10/09/2012   12 lead EKG today: NSR, rate 80, left anterior fascicular block, voltage criteria for LVH in lead aVL and also in R (I) and+ S (III), no ischemic changes, poor R wave progression, no ectopy. Intervals and duration normal.  Compared to EKG 10/31/12, rate is higher on today's ekg.  ASSESSMENT AND PLAN:   New pt:  1) Chest pressure and DOE worrisome for unstable angina; pt with + hx of CAD w/stent. EKG unchanged compared to 2014 (most recent one in our system-->his cardiologist is with [facility]). He has not had his aspirin today. ASA 81mg  x 4 given here today.  Recommended pt go to the ED for further evaluation for ACS. He and wife expressed understanding and were agreeable with this plan and will go to ED now. I do feel it is appropriate to go by private vehicle rather than ems transport.  No lab work done today.  No new meds rx'd today. In near future he'll need routine labs if they are not done in ED/hospital. If he rules out, we will need to either get him back in with his cardiologist for further eval OR we'll do CXR, echo, and stress test ourselves.  An After Visit Summary was printed and given to the patient.  F/u to be determined based on ED eval.  Signed:  Crissie Sickles, MD           05/19/2018

## 2018-05-19 NOTE — ED Triage Notes (Signed)
C/o CP x 1 month-sent from PCP-NAD-steady gait

## 2018-05-19 NOTE — Patient Instructions (Signed)
Go directly to the Emergency department to get further evaluation for your chest complaints.

## 2018-05-19 NOTE — ED Notes (Signed)
Pt alert, smiling, and talkative. Skin warm and dry. States very little change in chest pressure. MD aware.

## 2018-05-19 NOTE — Progress Notes (Signed)
Paged on call MD that pt has arrived from [facility]

## 2018-05-19 NOTE — ED Notes (Signed)
Patient transported to X-ray 

## 2018-05-19 NOTE — ED Provider Notes (Signed)
[facility] EMERGENCY DEPARTMENT Provider Note   CSN: [id number] Arrival date & time: 05/19/18  1525    History   Chief Complaint Chief Complaint  Patient presents with  . Chest Pain    HPI Richard [name] is a 73 y.o. male.  HPI: A 73 year old patient with a history of hypertension, hypercholesterolemia and obesity presents for evaluation of chest pain. Initial onset of pain was more than 6 hours ago. The patient's chest pain is described as heaviness/pressure/tightness and is worse with exertion. The patient's chest pain is middle- or left-sided, is not well-localized, is not sharp and does not radiate to the arms/jaw/neck. The patient does not complain of nausea and denies diaphoresis. The patient has no history of stroke, has no history of peripheral artery disease, has not smoked in the past 90 days, denies any history of treated diabetes and has no relevant family history of coronary artery disease (first degree relative at less than age 93).   Patient is a 73 year old male with a history of hypertension, hyperlipidemia, coronary artery disease status post stent placement who presents with chest pain.  He reports a one-month history of worsening discomfort across his chest which he describes as a tightness.  It comes and goes but has been getting steadily worse.  He also has associated shortness of breath and is only able to walk short distances without getting short of breath.  He has noticed a little bit of swelling in his ankles and feet.  He denies any fevers.  No cough or cold symptoms.  He recently was seen by a cardiologist associated with [facility].  He had a recent stent placed about a year ago.  However now his insurance is changed and he is switching all his doctors over to the [facility] health system.  He did take 4 baby aspirin prior to arrival while he was at his PCPs office today.     Past Medical History:  Diagnosis Date  . Coronary artery disease    a. DES  to LAD 04/2011 - took Effient/ASA x 1 yr, now on ASA only. b. Cath 10/2012: patent stent, otherwise normal cors.  . Diabetic peripheral neuropathy ([place])   . GERD (gastroesophageal reflux disease)    HAS HAD ESOPHAGUS STRETCHED SEVERAL TIMES IN THE PAST  . Hyperlipidemia      . Hypertension   . Myocardial infarction ([place])   . OSA on CPAP 10/18/2011   doesn't wear it  . Prediabetes   . Right knee pain    TORN RIGHT KNEE MEDIAL MENSICAL TEAR  . Shortness of breath    WITH EXERTION  . Syncope    a. 10/2012.    Patient Active Problem List   Diagnosis Date Noted  . Unstable angina ([place]) 05/19/2018  . ACS (acute coronary syndrome) ([place]) 05/19/2018  . Coronary atherosclerosis of native coronary artery 10/31/2012  . HTN (hypertension) 10/31/2012  . HLD (hyperlipidemia) 10/31/2012  . OSA (obstructive sleep apnea) 10/31/2012  . Acute medial meniscal tear 10/22/2011    Past Surgical History:  Procedure Laterality Date  . CARDIAC CATHETERIZATION  06/2017   In-stent restenosis cleared out  . CARDIOVASCULAR STRESS TEST     Latter part of 2019--normal per cardiologist's note from 10/2017  . CERVICAL FUSION  1990 and 1993   X 2   SURGERIES    SLIGHT LIMITATION ROM  . COLONOSCOPY  11/24/2004; 2018   NORMAL.  Recall 2016.  Repeat 2018 normal per pt.  . CORONARY  ANGIOPLASTY WITH STENT PLACEMENT    . KNEE ARTHROSCOPY  10/22/2011   Procedure: ARTHROSCOPY KNEE;  Surgeon: Gearlean Alf, MD;  Location: [facility] ORS;  Service: Orthopedics;  Laterality: Right;  Right knee scope with debridement  . LAPAROSCOPIC CHOLECYSTECTOMY  2018  . LEFT HEART CATHETERIZATION WITH CORONARY ANGIOGRAM N/A 10/08/2012   Procedure: LEFT HEART CATHETERIZATION WITH CORONARY ANGIOGRAM;  Surgeon: Lorretta Harp, MD;  Location: [facility] CATH LAB;  Service: Cardiovascular;  Laterality: N/A;  . NASAL SINUS SURGERY     ONE SINUS SURGERY THRU NOSE AND ANOTHER SINUS SURGERY THRU INCISION ABOVE RT EYE        Home Medications    Prior  to Admission medications   Medication Sig Start Date End Date Taking? Authorizing Provider  amLODipine (NORVASC) 5 MG tablet Take by mouth. 10/21/17   [provider]  aspirin EC 81 MG tablet Take 81 mg by mouth daily.    [provider]  cholecalciferol (VITAMIN D) 1000 UNITS tablet Take 2,000 Units by mouth daily.     [provider]  furosemide (LASIX) 40 MG tablet TAKE 1 TABLET BY MOUTH EVERY DAY 01/21/18   [provider]  gabapentin (NEURONTIN) 300 MG capsule Take by mouth. 05/01/18   [provider]  ibuprofen (ADVIL,MOTRIN) 200 MG tablet Take 600 mg by mouth every morning. pain    [provider]  lisinopril (PRINIVIL,ZESTRIL) 20 MG tablet 20 mg. Take 1 tablet (20 mg total) by mouth daily. 01/11/12 01/10/13  [provider]  lisinopril (PRINIVIL,ZESTRIL) 20 MG tablet Take 20 mg by mouth 2 (two) times daily. Patient takes once daily. 05/01/18   [provider]  metFORMIN (GLUCOPHAGE) 500 MG tablet Take 1 tablet (500 mg total) by mouth 2 (two) times daily with a meal. 10/11/12   Dunn, Dayna N, PA-C  metoprolol succinate (TOPROL-XL) 25 MG 24 hr tablet Take 25 mg by mouth daily.    [provider]  nitroGLYCERIN (NITROSTAT) 0.4 MG SL tablet Place 1 tablet (0.4 mg total) under the tongue every 5 (five) minutes as needed for chest pain (up to 3 doses). 10/09/12   Dunn, Nedra Hai, PA-C  Omega-3 Fatty Acids (OMEGA 3 PO) Take 1 capsule by mouth daily.    [provider]  pantoprazole (PROTONIX) 40 MG tablet Take 40 mg by mouth daily.    [provider]  simvastatin (ZOCOR) 20 MG tablet Take by mouth. 06/09/17   [provider]    Family History Family History  Problem Relation Age of Onset  . Arthritis Mother   . Cancer Father     Social History Social History   Tobacco Use  . Smoking status: Never Smoker  . Smokeless tobacco: Former Systems developer    Types: Chew  Substance Use Topics  . Alcohol use:  No  . Drug use: No     Allergies   Patient has no known allergies.   Review of Systems Review of Systems  Constitutional: Positive for fatigue. Negative for chills, diaphoresis and fever.  HENT: Negative for congestion, rhinorrhea and sneezing.   Eyes: Negative.   Respiratory: Positive for cough, chest tightness and shortness of breath.   Cardiovascular: Positive for chest pain and leg swelling.  Gastrointestinal: Negative for abdominal pain, blood in stool, diarrhea, nausea and vomiting.  Genitourinary: Negative for difficulty urinating, flank pain, frequency and hematuria.  Musculoskeletal: Negative for arthralgias and back pain.  Skin: Negative for rash.  Neurological: Negative for dizziness, speech difficulty, weakness,  numbness and headaches.     Physical Exam Updated Vital Signs BP (!) 186/100 (BP Location: Right Arm)   Pulse 75   Temp 98 F (36.7 C) (Oral)   Resp 18   Ht 5\' 8"  (1.727 m)   Wt 121.6 kg   SpO2 95%   BMI 40.75 kg/m   Physical Exam Constitutional:      Appearance: He is well-developed.  HENT:     Head: Normocephalic and atraumatic.  Eyes:     Pupils: Pupils are equal, round, and reactive to light.  Neck:     Musculoskeletal: Normal range of motion and neck supple.  Cardiovascular:     Rate and Rhythm: Normal rate and regular rhythm.     Heart sounds: Normal heart sounds.  Pulmonary:     Effort: Pulmonary effort is normal. No respiratory distress.     Breath sounds: Normal breath sounds. No wheezing or rales.  Chest:     Chest wall: No tenderness.  Abdominal:     General: Bowel sounds are normal.     Palpations: Abdomen is soft.     Tenderness: There is no abdominal tenderness. There is no guarding or rebound.  Musculoskeletal: Normal range of motion.     Comments: 1+ pitting edema bilaterally  Lymphadenopathy:     Cervical: No cervical adenopathy.  Skin:    General: Skin is warm and dry.     Findings: No rash.  Neurological:      Mental Status: He is alert and oriented to person, place, and time.      ED Treatments / Results  Labs (all labs ordered are listed, but only abnormal results are displayed) Labs Reviewed  BASIC METABOLIC PANEL - Abnormal; Notable for the following components:      Result Value   Glucose, Bld 121 (*)    All other components within normal limits  GLUCOSE, CAPILLARY - Abnormal; Notable for the following components:   Glucose-Capillary 114 (*)    All other components within normal limits  CBC  TROPONIN I  BRAIN NATRIURETIC PEPTIDE  CBC WITH DIFFERENTIAL/PLATELET  CBC  T4, FREE  TSH  COMPREHENSIVE METABOLIC PANEL  HEPARIN LEVEL (UNFRACTIONATED)    EKG EKG Interpretation  Date/Time:  Friday May 19 2018 15:31:27 EDT Ventricular Rate:  83 PR Interval:  160 QRS Duration: 118 QT Interval:  380 QTC Calculation: 446 R Axis:   -69 Text Interpretation:  Normal sinus rhythm Left anterior fascicular block Left ventricular hypertrophy with QRS widening and repolarization abnormality Abnormal ECG since last tracing no significant change Confirmed by Malvin Johns [phone]) on 05/19/2018 3:45:29 PM   Radiology Dg Chest 2 View  Result Date: 05/19/2018 CLINICAL DATA:  73 year old male with acute chest pain EXAM: CHEST - 2 VIEW COMPARISON:  10/08/2012 FINDINGS: The cardiomediastinal silhouette is unremarkable. There is no evidence of focal airspace disease, pulmonary edema, suspicious pulmonary nodule/mass, pleural effusion, or pneumothorax. No acute bony abnormalities are identified. Cervical fusion changes again noted. IMPRESSION: No active cardiopulmonary disease. Electronically Signed   By: Margarette Canada M.D.   On: 05/19/2018 16:04    Procedures Procedures (including critical care time)  Medications Ordered in ED Medications  sodium chloride flush (NS) 0.9 % injection 3 mL (3 mLs Intravenous Not Given 05/19/18 1554)  heparin ADULT infusion 100 units/mL (25000 units/241mL sodium  chloride 0.45%) (1,200 Units/hr Intravenous Transfusing/Transfer 05/19/18 2053)  aspirin EC tablet 81 mg (81 mg Oral Given 05/19/18 2213)  amLODipine (NORVASC) tablet 5 mg (5 mg  Oral Given 05/19/18 2212)  furosemide (LASIX) tablet 40 mg (40 mg Oral Given 05/19/18 2213)  lisinopril (PRINIVIL,ZESTRIL) tablet 20 mg (has no administration in time range)  metoprolol succinate (TOPROL-XL) 24 hr tablet 25 mg (25 mg Oral Given 05/19/18 2212)  simvastatin (ZOCOR) tablet 40 mg (has no administration in time range)  pantoprazole (PROTONIX) EC tablet 40 mg (has no administration in time range)  gabapentin (NEURONTIN) capsule 300 mg (300 mg Oral Given 05/19/18 2213)  nitroGLYCERIN (NITROSTAT) SL tablet 0.4 mg (has no administration in time range)  acetaminophen (TYLENOL) tablet 650 mg (has no administration in time range)  ondansetron (ZOFRAN) injection 4 mg (has no administration in time range)  nitroGLYCERIN (NITRODUR - Dosed in mg/24 hr) patch 0.2 mg (has no administration in time range)  cholecalciferol (VITAMIN D3) tablet 2,000 Units (has no administration in time range)  nitroGLYCERIN (NITROSTAT) SL tablet 0.4 mg (0.4 mg Sublingual Given 05/19/18 1646)  heparin bolus via infusion 4,000 Units (4,000 Units Intravenous Bolus from Bag 05/19/18 1840)     Initial Impression / Assessment and Plan / ED Course  I have reviewed the triage vital signs and the nursing notes.  Pertinent labs & imaging results that were available during my care of the patient were reviewed by me and considered in my medical decision making (see chart for details).     HEAR Score: 6  Patient is a 73 year old male who presents with worsening chest tightness and shortness of breath.  His chest pain markedly improved following nitroglycerin.  He had been given 4 baby aspirin prior to arrival at his PCP office.  His EKG does not show any acute ischemic changes.  His troponin is negative.  There is no signs of fluid overload/CHF.  Given  his symptoms, I spoke with Dr. Martinique with cardiology who is excepted the patient for transfer/admission to [facility].  He was started on heparin for unstable angina.  CRITICAL CARE Performed by: Malvin Johns Total critical care time: 45 minutes Critical care time was exclusive of separately billable procedures and treating other patients. Critical care was necessary to treat or prevent imminent or life-threatening deterioration. Critical care was time spent personally by me on the following activities: development of treatment plan with patient and/or surrogate as well as nursing, discussions with consultants, evaluation of patient's response to treatment, examination of patient, obtaining history from patient or surrogate, ordering and performing treatments and interventions, ordering and review of laboratory studies, ordering and review of radiographic studies, pulse oximetry and re-evaluation of patient's condition.   Final Clinical Impressions(s) / ED Diagnoses   Final diagnoses:  Unstable angina [facility])    ED Discharge Orders    None       Malvin Johns, MD 05/19/18 2243

## 2018-05-20 ENCOUNTER — Inpatient Hospital Stay (HOSPITAL_COMMUNITY): Payer: HMO

## 2018-05-20 DIAGNOSIS — I5032 Chronic diastolic (congestive) heart failure: Secondary | ICD-10-CM

## 2018-05-20 DIAGNOSIS — E119 Type 2 diabetes mellitus without complications: Secondary | ICD-10-CM

## 2018-05-20 DIAGNOSIS — I10 Essential (primary) hypertension: Secondary | ICD-10-CM

## 2018-05-20 DIAGNOSIS — G4733 Obstructive sleep apnea (adult) (pediatric): Secondary | ICD-10-CM

## 2018-05-20 DIAGNOSIS — I249 Acute ischemic heart disease, unspecified: Secondary | ICD-10-CM

## 2018-05-20 DIAGNOSIS — I200 Unstable angina: Secondary | ICD-10-CM

## 2018-05-20 DIAGNOSIS — E785 Hyperlipidemia, unspecified: Secondary | ICD-10-CM

## 2018-05-20 LAB — CBC
HCT: 42.7 % (ref 39.0–52.0)
Hemoglobin: 14.8 g/dL (ref 13.0–17.0)
MCH: 33.2 pg (ref 26.0–34.0)
MCHC: 34.7 g/dL (ref 30.0–36.0)
MCV: 95.7 fL (ref 80.0–100.0)
NRBC: 0 % (ref 0.0–0.2)
Platelets: 192 10*3/uL (ref 150–400)
RBC: 4.46 MIL/uL (ref 4.22–5.81)
RDW: 13.2 % (ref 11.5–15.5)
WBC: 7.6 10*3/uL (ref 4.0–10.5)

## 2018-05-20 LAB — HEPARIN LEVEL (UNFRACTIONATED)
Heparin Unfractionated: 0.28 IU/mL — ABNORMAL LOW (ref 0.30–0.70)
Heparin Unfractionated: 0.32 IU/mL (ref 0.30–0.70)

## 2018-05-20 LAB — GLUCOSE, CAPILLARY
Glucose-Capillary: 124 mg/dL — ABNORMAL HIGH (ref 70–99)
Glucose-Capillary: 156 mg/dL — ABNORMAL HIGH (ref 70–99)
Glucose-Capillary: 147 mg/dL — ABNORMAL HIGH (ref 70–99)

## 2018-05-20 LAB — TROPONIN I
Troponin I: <0.03 ng/mL (ref <0.03)
Troponin I: <0.03 ng/mL (ref <0.03)
Troponin I: <0.03 ng/mL (ref <0.03)

## 2018-05-20 MED ORDER — SODIUM CHLORIDE 0.9 % WEIGHT BASED INFUSION
3.0000 mL/kg/h | INTRAVENOUS | Status: DC
  Administered 2018-05-22: 3 mL/kg/h via INTRAVENOUS

## 2018-05-20 MED ORDER — INSULIN ASPART 100 UNIT/ML ~~LOC~~ SOLN: 0.0000 [IU] | Freq: Every day | SUBCUTANEOUS | Status: DC

## 2018-05-20 MED ORDER — SODIUM CHLORIDE 0.9 % WEIGHT BASED INFUSION
1.0000 mL/kg/h | INTRAVENOUS | Status: DC
  Administered 2018-05-22 (×2): 1 mL/kg/h via INTRAVENOUS

## 2018-05-20 MED ORDER — SODIUM CHLORIDE 0.9 % IV SOLN: 250.0000 mL | INTRAVENOUS | Status: DC | PRN

## 2018-05-20 MED ORDER — INSULIN ASPART 100 UNIT/ML ~~LOC~~ SOLN
0.0000 [IU] | Freq: Three times a day (TID) | SUBCUTANEOUS | Status: DC
  Administered 2018-05-20: 3 [IU] via SUBCUTANEOUS
  Administered 2018-05-21: 2 [IU] via SUBCUTANEOUS
  Administered 2018-05-21 (×2): 3 [IU] via SUBCUTANEOUS
  Administered 2018-05-22 (×2): 2 [IU] via SUBCUTANEOUS

## 2018-05-20 MED ORDER — ASPIRIN 81 MG PO CHEW
81.0000 mg | CHEWABLE_TABLET | ORAL | Status: AC
  Administered 2018-05-22: 81 mg via ORAL
  Filled 2018-05-20: qty 1

## 2018-05-20 MED ORDER — SODIUM CHLORIDE 0.9% FLUSH: 3.0000 mL | INTRAVENOUS | Status: DC | PRN

## 2018-05-20 MED ORDER — SODIUM CHLORIDE 0.9% FLUSH
3.0000 mL | Freq: Two times a day (BID) | INTRAVENOUS | Status: DC
  Administered 2018-05-22: 3 mL via INTRAVENOUS

## 2018-05-20 NOTE — Progress Notes (Signed)
[place] [facility] for Heparin Indication: chest pain/ACS  No Known Allergies  Patient Measurements: Height: 5\' 8"  (172.7 cm) Weight: 259 lb (117.5 kg) IBW/kg (Calculated) : 68.4 Heparin Dosing Weight: 96.3 kg  Vital Signs: Temp: 98.1 F (36.7 C) (03/14 0425) Temp Source: Oral (03/14 0425) BP: 113/66 (03/14 0425) Pulse Rate: 85 (03/14 0425)  Labs: Recent Labs    05/19/18 1549 05/19/18 2141 05/19/18 2310 05/20/18 0437 05/20/18 1103 05/20/18 1420  HGB 15.1 14.4  --  14.8  --   --   HCT 46.8 41.5  --  42.7  --   --   PLT 202 185  --  192  --   --   HEPARINUNFRC  --   --   --  0.28*  --  0.32  CREATININE 0.93 0.95  --   --   --   --   TROPONINI <0.03  --  <0.03 <0.03 <0.03  --     Estimated Creatinine Clearance: 88.8 mL/min (by C-G formula based on SCr of 0.95 mg/dL).   Medical History: Past Medical History:  Diagnosis Date  . Coronary artery disease    a. DES to LAD 04/2011 - took Effient/ASA x 1 yr, now on ASA only. b. Cath 10/2012: patent stent, otherwise normal cors.  . Diabetic peripheral neuropathy ([place])   . GERD (gastroesophageal reflux disease)    HAS HAD ESOPHAGUS STRETCHED SEVERAL TIMES IN THE PAST  . Hyperlipidemia      . Hypertension   . Myocardial infarction ([place])   . OSA on CPAP 10/18/2011   doesn't wear it  . Prediabetes   . Right knee pain    TORN RIGHT KNEE MEDIAL MENSICAL TEAR  . Shortness of breath    WITH EXERTION  . Syncope    a. 10/2012.    Assessment: 81 yoM admitted 3/13 with complaints of chest pain. Pharmacy has been consulted for heparin dosing. Patient not on anticoagulation PTA.  Plans noted for cath on 3/16 -heparin level at goal  Goal of Therapy:  Heparin level 0.3-0.7 units/ml Monitor platelets by anticoagulation protocol: Yes   Plan:  -No heparin changes needed -Daily heparin level and CBC  Hildred Laser, PharmD Clinical Pharmacist **Pharmacist phone directory can now be found on  [email or web address] ([email or web address]).  Listed under [place].

## 2018-05-20 NOTE — Progress Notes (Signed)
Progress Note  Patient Name: Richard [name] Date of Encounter: 05/20/2018  Primary Cardiologist: Dr. Lamar Blinks [facility])  Subjective   He continues to experience retrosternal chest discomfort with minimal exertion.  He is resting comfortably in the bed with several family members present.  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  Inpatient Medications    Scheduled Meds: . amLODipine  5 mg Oral Daily  . aspirin EC  81 mg Oral Daily  . cholecalciferol  2,000 Units Oral Daily  . furosemide  40 mg Oral Daily  . gabapentin  300 mg Oral QHS  . lisinopril  20 mg Oral Daily  . metoprolol succinate  25 mg Oral Daily  . nitroGLYCERIN  0.2 mg Transdermal Daily  . pantoprazole  40 mg Oral Daily  . simvastatin  40 mg Oral q1800  . sodium chloride flush  3 mL Intravenous Once   Continuous Infusions: . heparin 1,350 Units/hr (05/20/18 0610)   PRN Meds: acetaminophen, nitroGLYCERIN, ondansetron (ZOFRAN) IV   Vital Signs    Vitals:   05/19/18 1930 05/19/18 2053 05/19/18 2300 05/20/18 0425  BP: (!) 142/74 (!) 186/100 (!) 152/91 113/66  Pulse: 76 75  85  Resp: 19 18  14   Temp:  98 F (36.7 C)  98.1 F (36.7 C)  TempSrc:  Oral  Oral  SpO2: 95% 95%  93%  Weight:    117.5 kg  Height:        Intake/Output Summary (Last 24 hours) at 05/20/2018 0946 Last data filed at 05/20/2018 0600 Gross per 24 hour  Intake 635.41 ml  Output -  Net 635.41 ml   Filed Weights   05/19/18 1535 05/20/18 0425  Weight: 121.6 kg 117.5 kg    Telemetry    Sinus rhythm- Personally Reviewed  ECG    No new tracings- Personally Reviewed  Physical Exam   GEN: No acute distress.   Neck: No JVD Cardiac: RRR, no murmurs, rubs, or gallops.  Respiratory: Clear to auscultation bilaterally. GI: Soft, nontender, non-distended  MS: No edema; No deformity. Neuro:  Nonfocal  Psych: Normal affect   Labs    Chemistry Recent Labs  Lab 05/19/18 1549 05/19/18  2141  NA 138 138  K 3.6 4.4  CL 103 104  CO2 27 25  GLUCOSE 121* 112*  BUN 12 10  CREATININE 0.93 0.95  CALCIUM 9.1 8.9  PROT  --  5.8*  ALBUMIN  --  3.7  AST  --  36  ALT  --  7  ALKPHOS  --  56  BILITOT  --  1.6*  GFRNONAA >60 >60  GFRAA >60 >60  ANIONGAP 8 9     Hematology Recent Labs  Lab 05/19/18 1549 05/19/18 2141 05/20/18 0437  WBC 8.0 7.6 7.6  RBC 4.77 4.40 4.46  HGB 15.1 14.4 14.8  HCT 46.8 41.5 42.7  MCV 98.1 94.3 95.7  MCH 31.7 32.7 33.2  MCHC 32.3 34.7 34.7  RDW 13.1 13.1 13.2  PLT 202 185 192    Cardiac Enzymes Recent Labs  Lab 05/19/18 1549 05/19/18 2310 05/20/18 0437  TROPONINI <0.03 <0.03 <0.03   No results for input(s): TROPIPOC in the last 168 hours.   BNP Recent Labs  Lab 05/19/18 1549  BNP 26.2     DDimer No results for input(s): DDIMER in the last 168 hours.   Radiology    Dg Chest 2 View  Result Date: 05/19/2018 CLINICAL DATA:  73 year old male with acute chest pain EXAM: CHEST - 2 VIEW COMPARISON:  10/08/2012 FINDINGS: The cardiomediastinal silhouette is unremarkable. There is no evidence of focal airspace disease, pulmonary edema, suspicious pulmonary nodule/mass, pleural effusion, or pneumothorax. No acute bony abnormalities are identified. Cervical fusion changes again noted. IMPRESSION: No active cardiopulmonary disease. Electronically Signed   By: Margarette Canada M.D.   On: 05/19/2018 16:04    Cardiovascular procedures:  Cardiac catheterization 10/08/2012:  HEMODYNAMICS:    AO SYSTOLIC/AO DIASTOLIC: 94/70   LV SYSTOLIC/LV DIASTOLIC: 96/28  ANGIOGRAPHIC RESULTS:   1. Left main; normal  2. LAD; patent stent in the proximal LAD with at most 30% smooth in-stent restenosis 3. Left circumflex; nondominant and normal.  4. Right coronary artery; large, dominant and normal 5 Left ventriculography; RAO left ventriculogram was performed using  25 mL of Visipaque dye at 12 mL/second. The overall LVEF estimated  60% Without  wall motion abnormalities 6. Supravalvular aortography: Supravalvular aortogram was performed using 20 cc of Visipaque dye at 20 cc per second. The aortic root appeared dilated the bladder there was no dissection and the arch vessels appeared intact  IMPRESSION:Mr. Shams has a widely patent proximal LAD stent with otherwise normal coronary arteries and normal left function. There is no obvious culprit vessel or source of chest pain that is cardiovascular. The sheath was removed and a T-R band was placed on the right wrist to achieve . The patient left the Cath Lab in stable condition. The cardiac enzymes will be cycled. He'll be treated empirically with antibiotics medications.  Patient Profile     73 y.o. male with a history of coronary artery disease and prior MI and PCI who is being seen today for the evaluation of chest pain.  Assessment & Plan    1.  Unstable angina: Troponins have been negative x3.  He has a known history of prior MI and PCI to the LAD in 2013.  Continue aspirin, Toprol-XL, lisinopril, simvastatin, and IV heparin.  Metformin on hold.   I will order a 2-D echocardiogram with Doppler to evaluate cardiac structure, function, and regional wall motion. We will plan for cardiac catheterization on 05/22/2018. Risks and benefits of cardiac catheterization have been discussed with the patient.  These include bleeding, infection, kidney damage, stroke, heart attack, death.  The patient understands these risks and is willing to proceed.  2.  Hypertension: BP is much better controlled.  Continue amlodipine and lisinopril.  3.  Hyperlipidemia: I will check lipids.  Lipid panel from 06/09/2017 reviewed with triglycerides of 265 and LDL of 55.  If triglycerides remain elevated, I would start Vascepa.  Continue simvastatin.  4.  Chronic diastolic heart failure: Euvolemic on Lasix 40 mg daily.  Renal function is stable. I will order a 2-D echocardiogram with Doppler to evaluate cardiac  structure, function, and regional wall motion.  5.  Type 2 diabetes mellitus: Metformin on hold for cardiac catheterization. I will order insulin sliding scale and routine checks.    6.  Obstructive sleep apnea   For questions or updates, please contact [place] Please consult [email or web address] for contact info under Cardiology/STEMI.      Signed, Kate Sable, MD  05/20/2018, 9:46 AM

## 2018-05-20 NOTE — Progress Notes (Signed)
ANTICOAGULATION CONSULT NOTE   Pharmacy Consult for Heparin Indication: chest pain/ACS  No Known Allergies  Patient Measurements: Height: 5\' 8"  (172.7 cm) Weight: 259 lb (117.5 kg) IBW/kg (Calculated) : 68.4 Heparin Dosing Weight: 96.3 kg  Vital Signs: Temp: 98.1 F (36.7 C) (03/14 0425) Temp Source: Oral (03/14 0425) BP: 113/66 (03/14 0425) Pulse Rate: 85 (03/14 0425)  Labs: Recent Labs    05/19/18 1549 05/19/18 2141 05/19/18 2310 05/20/18 0437  HGB 15.1 14.4  --  14.8  HCT 46.8 41.5  --  42.7  PLT 202 185  --  192  HEPARINUNFRC  --   --   --  0.28*  CREATININE 0.93 0.95  --   --   TROPONINI <0.03  --  <0.03  --     Estimated Creatinine Clearance: 88.8 mL/min (by C-G formula based on SCr of 0.95 mg/dL).   Medical History: Past Medical History:  Diagnosis Date  . Coronary artery disease    a. DES to LAD 04/2011 - took Effient/ASA x 1 yr, now on ASA only. b. Cath 10/2012: patent stent, otherwise normal cors.  . Diabetic peripheral neuropathy ([place])   . GERD (gastroesophageal reflux disease)    HAS HAD ESOPHAGUS STRETCHED SEVERAL TIMES IN THE PAST  . Hyperlipidemia      . Hypertension   . Myocardial infarction ([employer])   . OSA on CPAP 10/18/2011   doesn't wear it  . Prediabetes   . Right knee pain    TORN RIGHT KNEE MEDIAL MENSICAL TEAR  . Shortness of breath    WITH EXERTION  . Syncope    a. 10/2012.    Assessment: 64 yoM admitted 3/13 with complaints of chest pain. Pharmacy has been consulted for heparin dosing. Patient not on anticoagulation PTA. Patient's baseline CBC stable, Hgb 15.1, HCT 46.8, and pltc 202.   3/14 AM update: heparin level just below goal  Goal of Therapy:  Heparin level 0.3-0.7 units/ml Monitor platelets by anticoagulation protocol: Yes   Plan:  Inc heparin to 1350 units/hr Re-check heparin level in 8 hours  Narda Bonds, PharmD, [place] Pharmacist Phone: [phone]

## 2018-05-21 ENCOUNTER — Other Ambulatory Visit (HOSPITAL_COMMUNITY): Payer: HMO

## 2018-05-21 ENCOUNTER — Inpatient Hospital Stay (HOSPITAL_COMMUNITY): Payer: HMO

## 2018-05-21 DIAGNOSIS — I959 Hypotension, unspecified: Secondary | ICD-10-CM

## 2018-05-21 DIAGNOSIS — E876 Hypokalemia: Secondary | ICD-10-CM

## 2018-05-21 DIAGNOSIS — I25110 Atherosclerotic heart disease of native coronary artery with unstable angina pectoris: Secondary | ICD-10-CM

## 2018-05-21 DIAGNOSIS — Z955 Presence of coronary angioplasty implant and graft: Secondary | ICD-10-CM

## 2018-05-21 DIAGNOSIS — N289 Disorder of kidney and ureter, unspecified: Secondary | ICD-10-CM

## 2018-05-21 DIAGNOSIS — I2510 Atherosclerotic heart disease of native coronary artery without angina pectoris: Secondary | ICD-10-CM

## 2018-05-21 HISTORY — PX: TRANSTHORACIC ECHOCARDIOGRAM: SHX275

## 2018-05-21 LAB — LIPID PANEL
Cholesterol: 185 mg/dL (ref 0–200)
HDL: 40 mg/dL — ABNORMAL LOW (ref >40)
LDL Cholesterol: 80 mg/dL (ref 0–99)
Total CHOL/HDL Ratio: 4.6 RATIO
Triglycerides: 327 mg/dL — ABNORMAL HIGH (ref <150)
VLDL: 65 mg/dL — ABNORMAL HIGH (ref 0–40)

## 2018-05-21 LAB — CBC
HCT: 44 % (ref 39.0–52.0)
Hemoglobin: 14.5 g/dL (ref 13.0–17.0)
MCH: 31.5 pg (ref 26.0–34.0)
MCHC: 33 g/dL (ref 30.0–36.0)
MCV: 95.7 fL (ref 80.0–100.0)
Platelets: 195 10*3/uL (ref 150–400)
RBC: 4.6 MIL/uL (ref 4.22–5.81)
RDW: 13.2 % (ref 11.5–15.5)
WBC: 10.1 10*3/uL (ref 4.0–10.5)
nRBC: 0 % (ref 0.0–0.2)

## 2018-05-21 LAB — BASIC METABOLIC PANEL
ANION GAP: 12 (ref 5–15)
ANION GAP: 10 (ref 5–15)
BUN: 21 mg/dL (ref 8–23)
BUN: 21 mg/dL (ref 8–23)
CHLORIDE: 96 mmol/L — AB (ref 98–111)
CO2: 26 mmol/L (ref 22–32)
CO2: 26 mmol/L (ref 22–32)
Calcium: 8.8 mg/dL — ABNORMAL LOW (ref 8.9–10.3)
Calcium: 8.7 mg/dL — ABNORMAL LOW (ref 8.9–10.3)
Chloride: 98 mmol/L (ref 98–111)
Creatinine, Ser: 1.7 mg/dL — ABNORMAL HIGH (ref 0.61–1.24)
Creatinine, Ser: 1.63 mg/dL — ABNORMAL HIGH (ref 0.61–1.24)
GFR calc Af Amer: 46 mL/min — ABNORMAL LOW (ref >60)
GFR calc Af Amer: 48 mL/min — ABNORMAL LOW (ref >60)
GFR calc non Af Amer: 40 mL/min — ABNORMAL LOW (ref >60)
GFR calc non Af Amer: 42 mL/min — ABNORMAL LOW (ref >60)
Glucose, Bld: 161 mg/dL — ABNORMAL HIGH (ref 70–99)
Glucose, Bld: 174 mg/dL — ABNORMAL HIGH (ref 70–99)
Potassium: 3.4 mmol/L — ABNORMAL LOW (ref 3.5–5.1)
Potassium: 3.5 mmol/L (ref 3.5–5.1)
Sodium: 134 mmol/L — ABNORMAL LOW (ref 135–145)
Sodium: 134 mmol/L — ABNORMAL LOW (ref 135–145)

## 2018-05-21 LAB — GLUCOSE, CAPILLARY
Glucose-Capillary: 146 mg/dL — ABNORMAL HIGH (ref 70–99)
Glucose-Capillary: 158 mg/dL — ABNORMAL HIGH (ref 70–99)
Glucose-Capillary: 156 mg/dL — ABNORMAL HIGH (ref 70–99)

## 2018-05-21 LAB — HEMOGLOBIN A1C
Hgb A1c MFr Bld: 6.9 % — ABNORMAL HIGH (ref 4.8–5.6)
Mean Plasma Glucose: 151.33 mg/dL

## 2018-05-21 LAB — ECHOCARDIOGRAM COMPLETE
Height: 68 in
WEIGHTICAEL: 4040 [oz_av]

## 2018-05-21 LAB — HEPARIN LEVEL (UNFRACTIONATED): Heparin Unfractionated: 0.32 IU/mL (ref 0.30–0.70)

## 2018-05-21 MED ORDER — ATORVASTATIN CALCIUM 80 MG PO TABS
80.0000 mg | ORAL_TABLET | Freq: Every day | ORAL | Status: DC
  Administered 2018-05-21 – 2018-05-22 (×2): 80 mg via ORAL
  Filled 2018-05-21 (×2): qty 1

## 2018-05-21 MED ORDER — SODIUM CHLORIDE 0.45 % IV BOLUS
500.0000 mL | Freq: Once | INTRAVENOUS | Status: AC
  Administered 2018-05-21: 500 mL via INTRAVENOUS

## 2018-05-21 MED ORDER — POTASSIUM CHLORIDE CRYS ER 20 MEQ PO TBCR
40.0000 meq | EXTENDED_RELEASE_TABLET | Freq: Once | ORAL | Status: AC
  Administered 2018-05-21: 40 meq via ORAL
  Filled 2018-05-21: qty 2

## 2018-05-21 MED ORDER — PERFLUTREN LIPID MICROSPHERE
1.0000 mL | INTRAVENOUS | Status: AC | PRN
  Administered 2018-05-21: 5 mL via INTRAVENOUS
  Filled 2018-05-21: qty 10

## 2018-05-21 NOTE — Progress Notes (Signed)
[place] for Heparin Indication: chest pain/ACS  No Known Allergies  Patient Measurements: Height: 5\' 8"  (172.7 cm) Weight: 252 lb 8 oz (114.5 kg) IBW/kg (Calculated) : 68.4 Heparin Dosing Weight: 96.3 kg  Vital Signs: Temp: 98.2 F (36.8 C) (03/15 0425) Temp Source: Oral (03/15 0425) BP: 119/82 (03/15 1011) Pulse Rate: 75 (03/15 0425)  Labs: Recent Labs    05/19/18 1549 05/19/18 2141 05/19/18 2310 05/20/18 0437 05/20/18 1103 05/20/18 1420 05/21/18 0442  HGB 15.1 14.4  --  14.8  --   --  14.5  HCT 46.8 41.5  --  42.7  --   --  44.0  PLT 202 185  --  192  --   --  195  HEPARINUNFRC  --   --   --  0.28*  --  0.32 0.32  CREATININE 0.93 0.95  --   --   --   --  1.70*  TROPONINI <0.03  --  <0.03 <0.03 <0.03  --   --     Estimated Creatinine Clearance: 48.9 mL/min (A) (by C-G formula based on SCr of 1.7 mg/dL (H)).   Medical History: Past Medical History:  Diagnosis Date  . Coronary artery disease    a. DES to LAD 04/2011 - took Effient/ASA x 1 yr, now on ASA only. b. Cath 10/2012: patent stent, otherwise normal cors.  . Diabetic peripheral neuropathy ([place])   . GERD (gastroesophageal reflux disease)    HAS HAD ESOPHAGUS STRETCHED SEVERAL TIMES IN THE PAST  . Hyperlipidemia      . Hypertension   . Myocardial infarction ([place])   . OSA on CPAP 10/18/2011   doesn't wear it  . Prediabetes   . Right knee pain    TORN RIGHT KNEE MEDIAL MENSICAL TEAR  . Shortness of breath    WITH EXERTION  . Syncope    a. 10/2012.    Assessment: 6 yoM admitted 3/13 with complaints of chest pain. Pharmacy has been consulted for heparin dosing. Patient not on anticoagulation PTA.  Plans noted for cath on 3/16 -heparin level at goal  Goal of Therapy:  Heparin level 0.3-0.7 units/ml Monitor platelets by anticoagulation protocol: Yes   Plan:  -No heparin changes needed -Daily heparin level and CBC  Hildred Laser, PharmD Clinical  Pharmacist **Pharmacist phone directory can now be found on [email or web address] ([email or web address]).  Listed under [place].

## 2018-05-21 NOTE — Progress Notes (Signed)
  Echocardiogram 2D Echocardiogram has been performed.  Richard [name] 05/21/2018, 10:16 AM

## 2018-05-21 NOTE — Progress Notes (Signed)
Progress Note  Patient Name: Richard [name] Date of Encounter: 05/21/2018  Primary Cardiologist: Dr. Lamar Blinks [facility])  Subjective   He has chest pressure with exertion and overall feels fatigued.  Inpatient Medications    Scheduled Meds: . amLODipine  5 mg Oral Daily  . [START ON 05/22/2018] aspirin  81 mg Oral Pre-Cath  . aspirin EC  81 mg Oral Daily  . cholecalciferol  2,000 Units Oral Daily  . furosemide  40 mg Oral Daily  . gabapentin  300 mg Oral QHS  . insulin aspart  0-15 Units Subcutaneous TID WC  . insulin aspart  0-5 Units Subcutaneous QHS  . lisinopril  20 mg Oral Daily  . metoprolol succinate  25 mg Oral Daily  . nitroGLYCERIN  0.2 mg Transdermal Daily  . pantoprazole  40 mg Oral Daily  . simvastatin  40 mg Oral q1800  . sodium chloride flush  3 mL Intravenous Once  . sodium chloride flush  3 mL Intravenous Q12H   Continuous Infusions: . sodium chloride    . [START ON 05/22/2018] sodium chloride     Followed by  . [START ON 05/22/2018] sodium chloride    . heparin 1,350 Units/hr (05/21/18 1448)   PRN Meds: sodium chloride, acetaminophen, nitroGLYCERIN, ondansetron (ZOFRAN) IV, sodium chloride flush   Vital Signs    Vitals:   05/20/18 0425 05/20/18 1632 05/20/18 2039 05/21/18 0425  BP: 113/66 (!) 113/54 134/62 (!) 84/53  Pulse: 85 85 78 75  Resp: 14 14 18 17   Temp: 98.1 F (36.7 C) 98.5 F (36.9 C) 98.1 F (36.7 C) 98.2 F (36.8 C)  TempSrc: Oral Oral Oral Oral  SpO2: 93% 93% 91% 99%  Weight: 117.5 kg   114.5 kg  Height:        Intake/Output Summary (Last 24 hours) at 05/21/2018 0918 Last data filed at 05/21/2018 1856 Gross per 24 hour  Intake 295 ml  Output 200 ml  Net 95 ml   Filed Weights   05/19/18 1535 05/20/18 0425 05/21/18 0425  Weight: 121.6 kg 117.5 kg 114.5 kg    Telemetry    Sinus rhythm- Personally Reviewed  ECG    Sinus rhythm with LVH- Personally Reviewed  Physical Exam   GEN: No acute distress.   Neck: No JVD  Cardiac: RRR, no murmurs, rubs, or gallops.  Respiratory: Clear to auscultation bilaterally. GI: Soft, nontender, non-distended  MS: No edema; No deformity. Neuro:  Nonfocal  Psych: Normal affect   Labs    Chemistry Recent Labs  Lab 05/19/18 1549 05/19/18 2141  NA 138 138  K 3.6 4.4  CL 103 104  CO2 27 25  GLUCOSE 121* 112*  BUN 12 10  CREATININE 0.93 0.95  CALCIUM 9.1 8.9  PROT  --  5.8*  ALBUMIN  --  3.7  AST  --  36  ALT  --  7  ALKPHOS  --  56  BILITOT  --  1.6*  GFRNONAA >60 >60  GFRAA >60 >60  ANIONGAP 8 9     Hematology Recent Labs  Lab 05/19/18 2141 05/20/18 0437 05/21/18 0442  WBC 7.6 7.6 10.1  RBC 4.40 4.46 4.60  HGB 14.4 14.8 14.5  HCT 41.5 42.7 44.0  MCV 94.3 95.7 95.7  MCH 32.7 33.2 31.5  MCHC 34.7 34.7 33.0  RDW 13.1 13.2 13.2  PLT 185 192 195    Cardiac Enzymes Recent Labs  Lab 05/19/18 1549 05/19/18 2310 05/20/18 0437 05/20/18 1103  TROPONINI <0.03 <0.03 <0.03 <0.03   No results for input(s): TROPIPOC in the last 168 hours.   BNP Recent Labs  Lab 05/19/18 1549  BNP 26.2     DDimer No results for input(s): DDIMER in the last 168 hours.   Radiology    Dg Chest 2 View  Result Date: 05/19/2018 CLINICAL DATA:  73 year old male with acute chest pain EXAM: CHEST - 2 VIEW COMPARISON:  10/08/2012 FINDINGS: The cardiomediastinal silhouette is unremarkable. There is no evidence of focal airspace disease, pulmonary edema, suspicious pulmonary nodule/mass, pleural effusion, or pneumothorax. No acute bony abnormalities are identified. Cervical fusion changes again noted. IMPRESSION: No active cardiopulmonary disease. Electronically Signed   By: Margarette [place] M.D.   On: 05/19/2018 16:04    Cardiac Studies   Cardiac catheterization 10/08/2012:  HEMODYNAMICS:   AO SYSTOLIC/AO DIASTOLIC: 13/08  LV SYSTOLIC/LV DIASTOLIC: 65/78  ANGIOGRAPHIC RESULTS:   1. Left main; normal  2. LAD; patent stent in the proximal LAD with at most 30%  smooth in-stent restenosis 3. Left circumflex; nondominant and normal.  4. Right coronary artery; large, dominant and normal 5 Left ventriculography; RAO left ventriculogram was performed using  25 mL of Visipaque dye at 12 mL/second. The overall LVEF estimated  60% Without wall motion abnormalities 6. Supravalvular aortography: Supravalvular aortogram was performed using 20 cc of Visipaque dye at 20 cc per second. The aortic root appeared dilated the bladder there was no dissection and the arch vessels appeared intact  IMPRESSION:Mr. Virgin has a widely patent proximal LAD stent with otherwise normal coronary arteries and normal left function. There is no obvious culprit vessel or source of chest pain that is cardiovascular. The sheath was removed and a T-R band was placed on the right wrist to achieve . The patient left the Cath Lab in stable condition. The cardiac enzymes will be cycled. He'll be treated empirically with antibiotics medications.    Patient Profile     74 y.o. male with a history of coronary artery disease and prior MI and PCI who is being seen today for the evaluation ofchest pain.  Assessment & Plan    1.  Unstable angina: Troponins have been negative x3.  He has a known history of prior MI and PCI to the LAD in 2013.  Continue aspirin, Toprol-XL, simvastatin, and IV heparin.  Metformin on hold.    Creatinine up to 1.7 so I will discontinue lisinopril. An echocardiogram has just been performed and I will review. Cardiac catheterization arranged for 05/22/2018.  2.  Hypertension: BP is now low normal.  I rechecked it and it was 113/82.  I will hold lisinopril.  3.  Hyperlipidemia: Lipid panel showed triglycerides 327, HDL 40, LDL 80, and total cholesterol 185.   I will switch simvastatin 40 mg to atorvastatin 80 mg. Although Vascepa is not yet on inpatient formulary, I would recommend providing a prescription at the time of discharge.  4.  Chronic diastolic heart  failure: Euvolemic on Lasix 40 mg daily.    However, creatinine has gone up to 1.7.  I will discontinue Lasix and provide 500 cc of half-normal saline.  An echocardiogram has just been performed and I will review.  5.  Type 2 diabetes mellitus: Metformin on hold for cardiac catheterization. I have ordered an insulin sliding scale and routine checks.    A1c elevated at 6.9%.  6.  Obstructive sleep apnea  7.  Acute renal insufficiency: Creatinine up to 1.7.  Given previous hypotension this  may be due to acute tubular necrosis.  I have stopped lisinopril.  I will give 500 cc of half-normal saline.  I will also stop Lasix.  I will recheck a basic metabolic panel this evening.  8.  Hypokalemia: Mildly low at 3.4.  I will replace.  For questions or updates, please contact [place] Please consult [email or web address] for contact info under Cardiology/STEMI.      Signed, Kate Sable, MD  05/21/2018, 9:18 AM

## 2018-05-22 ENCOUNTER — Inpatient Hospital Stay (HOSPITAL_COMMUNITY)
Admission: EM | Disposition: A | Discharge status: Home / Self Care | Payer: Self-pay | Source: Home / Self Care | Attending: Cardiology

## 2018-05-22 ENCOUNTER — Encounter (HOSPITAL_COMMUNITY): Payer: Self-pay | Admitting: Interventional Cardiology

## 2018-05-22 ENCOUNTER — Other Ambulatory Visit: Payer: Self-pay

## 2018-05-22 DIAGNOSIS — Z9861 Coronary angioplasty status: Secondary | ICD-10-CM

## 2018-05-22 DIAGNOSIS — E781 Pure hyperglyceridemia: Secondary | ICD-10-CM

## 2018-05-22 DIAGNOSIS — I2510 Atherosclerotic heart disease of native coronary artery without angina pectoris: Secondary | ICD-10-CM

## 2018-05-22 DIAGNOSIS — E7800 Pure hypercholesterolemia, unspecified: Secondary | ICD-10-CM

## 2018-05-22 HISTORY — PX: INTRAVASCULAR ULTRASOUND/IVUS: CATH118244

## 2018-05-22 HISTORY — PX: CORONARY STENT INTERVENTION: CATH118234

## 2018-05-22 HISTORY — PX: LEFT HEART CATH AND CORONARY ANGIOGRAPHY: CATH118249

## 2018-05-22 HISTORY — PX: CORONARY ULTRASOUND/IVUS: CATH118244

## 2018-05-22 LAB — BASIC METABOLIC PANEL
Anion gap: 8 (ref 5–15)
BUN: 20 mg/dL (ref 8–23)
CO2: 26 mmol/L (ref 22–32)
Calcium: 8.7 mg/dL — ABNORMAL LOW (ref 8.9–10.3)
Chloride: 103 mmol/L (ref 98–111)
Creatinine, Ser: 1.33 mg/dL — ABNORMAL HIGH (ref 0.61–1.24)
GFR calc Af Amer: >60 mL/min (ref >60)
GFR, EST NON AFRICAN AMERICAN: 53 mL/min — AB (ref >60)
Glucose, Bld: 160 mg/dL — ABNORMAL HIGH (ref 70–99)
POTASSIUM: 3.6 mmol/L (ref 3.5–5.1)
Sodium: 137 mmol/L (ref 135–145)

## 2018-05-22 LAB — GLUCOSE, CAPILLARY
GLUCOSE-CAPILLARY: 132 mg/dL — AB (ref 70–99)
Glucose-Capillary: 170 mg/dL — ABNORMAL HIGH (ref 70–99)
Glucose-Capillary: 146 mg/dL — ABNORMAL HIGH (ref 70–99)
Glucose-Capillary: 124 mg/dL — ABNORMAL HIGH (ref 70–99)

## 2018-05-22 LAB — POCT ACTIVATED CLOTTING TIME
Activated Clotting Time: 274 seconds
Activated Clotting Time: 373 seconds
Activated Clotting Time: 433 seconds

## 2018-05-22 LAB — CBC
HCT: 43.5 % (ref 39.0–52.0)
Hemoglobin: 14.1 g/dL (ref 13.0–17.0)
MCH: 31.3 pg (ref 26.0–34.0)
MCHC: 32.4 g/dL (ref 30.0–36.0)
MCV: 96.5 fL (ref 80.0–100.0)
Platelets: 176 10*3/uL (ref 150–400)
RBC: 4.51 MIL/uL (ref 4.22–5.81)
RDW: 13.2 % (ref 11.5–15.5)
WBC: 8 10*3/uL (ref 4.0–10.5)
nRBC: 0 % (ref 0.0–0.2)

## 2018-05-22 LAB — HEPARIN LEVEL (UNFRACTIONATED): Heparin Unfractionated: 0.18 IU/mL — ABNORMAL LOW (ref 0.30–0.70)

## 2018-05-22 SURGERY — LEFT HEART CATH AND CORONARY ANGIOGRAPHY
Anesthesia: LOCAL

## 2018-05-22 MED ORDER — ACETAMINOPHEN 325 MG PO TABS: 650.0000 mg | ORAL_TABLET | ORAL | Status: DC | PRN

## 2018-05-22 MED ORDER — MIDAZOLAM HCL 2 MG/2ML IJ SOLN
INTRAMUSCULAR | Status: AC
  Filled 2018-05-22: qty 2

## 2018-05-22 MED ORDER — LISINOPRIL 20 MG PO TABS
20.0000 mg | ORAL_TABLET | Freq: Every evening | ORAL | Status: DC
  Administered 2018-05-22: 19:00:00 20 mg via ORAL
  Filled 2018-05-22: qty 1
  Filled 2018-05-22: qty 2

## 2018-05-22 MED ORDER — CLOPIDOGREL BISULFATE 300 MG PO TABS
ORAL_TABLET | ORAL | Status: DC | PRN
  Administered 2018-05-22: 600 mg via ORAL

## 2018-05-22 MED ORDER — ASPIRIN 81 MG PO TBEC: 81.0000 mg | DELAYED_RELEASE_TABLET | Freq: Every day | ORAL | Status: AC

## 2018-05-22 MED ORDER — NITROGLYCERIN 0.4 MG SL SUBL: 0.4000 mg | SUBLINGUAL_TABLET | SUBLINGUAL | 2 refills | Status: DC | PRN

## 2018-05-22 MED ORDER — ICOSAPENT ETHYL 1 G PO CAPS: 2.0000 g | ORAL_CAPSULE | Freq: Two times a day (BID) | ORAL | 5 refills | Status: DC

## 2018-05-22 MED ORDER — CLOPIDOGREL BISULFATE 75 MG PO TABS: 75.0000 mg | ORAL_TABLET | Freq: Every day | ORAL | 3 refills | Status: DC

## 2018-05-22 MED ORDER — SODIUM CHLORIDE 0.9% FLUSH: 3.0000 mL | INTRAVENOUS | Status: DC | PRN

## 2018-05-22 MED ORDER — ONDANSETRON HCL 4 MG/2ML IJ SOLN: 4.0000 mg | Freq: Four times a day (QID) | INTRAMUSCULAR | Status: DC | PRN

## 2018-05-22 MED ORDER — ASPIRIN 81 MG PO CHEW: 81.0000 mg | CHEWABLE_TABLET | Freq: Every day | ORAL | Status: DC

## 2018-05-22 MED ORDER — SODIUM CHLORIDE 0.9% FLUSH
3.0000 mL | Freq: Two times a day (BID) | INTRAVENOUS | Status: DC
  Administered 2018-05-22: 3 mL via INTRAVENOUS

## 2018-05-22 MED ORDER — HYDRALAZINE HCL 20 MG/ML IJ SOLN: 5.0000 mg | INTRAMUSCULAR | Status: AC | PRN

## 2018-05-22 MED ORDER — LIDOCAINE HCL (PF) 1 % IJ SOLN
INTRAMUSCULAR | Status: DC | PRN
  Administered 2018-05-22: 2 mL

## 2018-05-22 MED ORDER — CLOPIDOGREL BISULFATE 300 MG PO TABS
ORAL_TABLET | ORAL | Status: AC
  Filled 2018-05-22: qty 2

## 2018-05-22 MED ORDER — HEPARIN SODIUM (PORCINE) 1000 UNIT/ML IJ SOLN
INTRAMUSCULAR | Status: AC
  Filled 2018-05-22: qty 1

## 2018-05-22 MED ORDER — SODIUM CHLORIDE 0.9 % IV SOLN: 250.0000 mL | INTRAVENOUS | Status: DC | PRN

## 2018-05-22 MED ORDER — HEPARIN (PORCINE) IN NACL 1000-0.9 UT/500ML-% IV SOLN
INTRAVENOUS | Status: AC
  Filled 2018-05-22: qty 500

## 2018-05-22 MED ORDER — HEPARIN SODIUM (PORCINE) 1000 UNIT/ML IJ SOLN
INTRAMUSCULAR | Status: DC | PRN
  Administered 2018-05-22: 5000 [IU] via INTRAVENOUS
  Administered 2018-05-22 (×2): 6000 [IU] via INTRAVENOUS

## 2018-05-22 MED ORDER — LABETALOL HCL 5 MG/ML IV SOLN: 10.0000 mg | INTRAVENOUS | Status: AC | PRN

## 2018-05-22 MED ORDER — ALBUTEROL SULFATE (2.5 MG/3ML) 0.083% IN NEBU
INHALATION_SOLUTION | RESPIRATORY_TRACT | Status: AC
  Filled 2018-05-22: qty 3

## 2018-05-22 MED ORDER — ATORVASTATIN CALCIUM 80 MG PO TABS: 80.0000 mg | ORAL_TABLET | Freq: Every day | ORAL | 5 refills | Status: DC

## 2018-05-22 MED ORDER — FAMOTIDINE IN NACL 20-0.9 MG/50ML-% IV SOLN
INTRAVENOUS | Status: AC | PRN
  Administered 2018-05-22: 20 mg via INTRAVENOUS

## 2018-05-22 MED ORDER — VERAPAMIL HCL 2.5 MG/ML IV SOLN
INTRAVENOUS | Status: DC | PRN
  Administered 2018-05-22: 10 mL via INTRA_ARTERIAL

## 2018-05-22 MED ORDER — METFORMIN HCL 500 MG PO TABS: 500.0000 mg | ORAL_TABLET | Freq: Two times a day (BID) | ORAL | Status: DC

## 2018-05-22 MED ORDER — FENTANYL CITRATE (PF) 100 MCG/2ML IJ SOLN
INTRAMUSCULAR | Status: DC | PRN
  Administered 2018-05-22 (×2): 25 ug via INTRAVENOUS

## 2018-05-22 MED ORDER — MIDAZOLAM HCL 2 MG/2ML IJ SOLN
INTRAMUSCULAR | Status: DC | PRN
  Administered 2018-05-22: 2 mg via INTRAVENOUS
  Administered 2018-05-22: 1 mg via INTRAVENOUS

## 2018-05-22 MED ORDER — VERAPAMIL HCL 2.5 MG/ML IV SOLN
INTRAVENOUS | Status: AC
  Filled 2018-05-22: qty 2

## 2018-05-22 MED ORDER — NITROGLYCERIN 1 MG/10 ML FOR IR/CATH LAB
INTRA_ARTERIAL | Status: AC
  Filled 2018-05-22: qty 10

## 2018-05-22 MED ORDER — FAMOTIDINE IN NACL 20-0.9 MG/50ML-% IV SOLN
INTRAVENOUS | Status: AC
  Filled 2018-05-22: qty 50

## 2018-05-22 MED ORDER — FENTANYL CITRATE (PF) 100 MCG/2ML IJ SOLN
INTRAMUSCULAR | Status: AC
  Filled 2018-05-22: qty 2

## 2018-05-22 MED ORDER — IOHEXOL 350 MG/ML SOLN
INTRAVENOUS | Status: DC | PRN
  Administered 2018-05-22: 110 mL via INTRACARDIAC

## 2018-05-22 MED ORDER — CLOPIDOGREL BISULFATE 75 MG PO TABS: 75.0000 mg | ORAL_TABLET | Freq: Every day | ORAL | Status: DC

## 2018-05-22 MED ORDER — HEPARIN (PORCINE) IN NACL 1000-0.9 UT/500ML-% IV SOLN
INTRAVENOUS | Status: DC | PRN
  Administered 2018-05-22 (×2): 500 mL

## 2018-05-22 MED ORDER — LIDOCAINE HCL (PF) 1 % IJ SOLN
INTRAMUSCULAR | Status: AC
  Filled 2018-05-22: qty 30

## 2018-05-22 MED ORDER — SODIUM CHLORIDE 0.9 % IV SOLN
INTRAVENOUS | Status: AC
  Administered 2018-05-22: 13:00:00 via INTRAVENOUS

## 2018-05-22 SURGICAL SUPPLY — 23 items
BALLN  ~~LOC~~ SAPPHIRE 4.5X15 (BALLOONS) ×1
BALLN WOLVERINE 3.00X10 (BALLOONS) ×2
BALLN ~~LOC~~ SAPPHIRE 4.5X15 (BALLOONS) ×1
BALLOON WOLVERINE 3.00X10 (BALLOONS) IMPLANT
BALLOON ~~LOC~~ SAPPHIRE 4.5X15 (BALLOONS) IMPLANT
CATH 5FR JL3.5 JR4 ANG PIG MP (CATHETERS) ×1 IMPLANT
CATH LAUNCHER 6FR EBU 3.75 (CATHETERS) ×1 IMPLANT
CATH LAUNCHER 6FR EBU3.5 (CATHETERS) ×1 IMPLANT
CATH OPTICROSS 40MHZ (CATHETERS) ×1 IMPLANT
DEVICE RAD COMP TR BAND LRG (VASCULAR PRODUCTS) ×1 IMPLANT
GLIDESHEATH SLEND SS 6F .021 (SHEATH) ×1 IMPLANT
GUIDEWIRE INQWIRE 1.5J.035X260 (WIRE) IMPLANT
INQWIRE 1.5J .035X260CM (WIRE) ×2
KIT ENCORE 26 ADVANTAGE (KITS) ×1 IMPLANT
KIT HEART LEFT (KITS) ×2 IMPLANT
KIT HEMO VALVE WATCHDOG (MISCELLANEOUS) ×1 IMPLANT
PACK CARDIAC CATHETERIZATION (CUSTOM PROCEDURE TRAY) ×2 IMPLANT
SLED PULL BACK IVUS (MISCELLANEOUS) ×1 IMPLANT
STENT SYNERGY DES 4X38 (Permanent Stent) ×1 IMPLANT
TRANSDUCER W/STOPCOCK (MISCELLANEOUS) ×2 IMPLANT
TUBING CIL FLEX 10 FLL-RA (TUBING) ×2 IMPLANT
WIRE ASAHI PROWATER 180CM (WIRE) ×1 IMPLANT
WIRE MAILMAN 300CM (WIRE) ×1 IMPLANT

## 2018-05-22 NOTE — Interval H&P Note (Signed)
Cath Lab Visit (complete for each Cath Lab visit)  Clinical Evaluation Leading to the Procedure:   ACS: Yes.    Non-ACS:    Anginal Classification: CCS IV  Anti-ischemic medical therapy: Minimal Therapy (1 class of medications)  Non-Invasive Test Results: No non-invasive testing performed  Prior CABG: No previous CABG      History and Physical Interval Note:  05/22/2018 11:27 AM  Richard [name]  has presented today for surgery, with the diagnosis of unstable angina.  The various methods of treatment have been discussed with the patient and family. After consideration of risks, benefits and other options for treatment, the patient has consented to  Procedure(s): LEFT HEART CATH AND CORONARY ANGIOGRAPHY (N/A) as a surgical intervention.  The patient's history has been reviewed, patient examined, no change in status, stable for surgery.  I have reviewed the patient's chart and labs.  Questions were answered to the patient's satisfaction.     Larae Grooms

## 2018-05-22 NOTE — H&P (View-Only) (Signed)
Progress Note  Patient Name: Richard [name] Date of Encounter: 05/22/2018  Primary Cardiologist: Dr. Lamar [name] [facility])  Subjective   Currently CP free. No dyspnea. Awaiting cath.   Inpatient Medications    Scheduled Meds: . amLODipine  5 mg Oral Daily  . aspirin EC  81 mg Oral Daily  . atorvastatin  80 mg Oral q1800  . cholecalciferol  2,000 Units Oral Daily  . gabapentin  300 mg Oral QHS  . insulin aspart  0-15 Units Subcutaneous TID WC  . insulin aspart  0-5 Units Subcutaneous QHS  . metoprolol succinate  25 mg Oral Daily  . nitroGLYCERIN  0.2 mg Transdermal Daily  . pantoprazole  40 mg Oral Daily  . sodium chloride flush  3 mL Intravenous Once  . sodium chloride flush  3 mL Intravenous Q12H   Continuous Infusions: . sodium chloride    . sodium chloride 1 mL/kg/hr (05/22/18 0507)  . heparin 1,600 Units/hr (05/22/18 0548)   PRN Meds: sodium chloride, acetaminophen, nitroGLYCERIN, ondansetron (ZOFRAN) IV, sodium chloride flush   Vital Signs    Vitals:   05/21/18 1746 05/21/18 2027 05/22/18 0348 05/22/18 0751  BP: 109/80 111/75 (!) 143/81 124/86  Pulse: 87 79 79 78  Resp: 16 16 19    Temp:  98.1 F (36.7 C) 98.1 F (36.7 C)   TempSrc:  Oral Oral   SpO2:  95% 91%   Weight:   115.5 kg   Height:        Intake/Output Summary (Last 24 hours) at 05/22/2018 0837 Last data filed at 05/22/2018 0700 Gross per 24 hour  Intake 2091.17 ml  Output 676 ml  Net 1415.17 ml   Last 3 Weights 05/22/2018 05/21/2018 05/20/2018  Weight (lbs) 254 lb 9.6 oz 252 lb 8 oz 259 lb  Weight (kg) 115.486 kg 114.533 kg 117.482 kg      Telemetry    NSR - Personally Reviewed  ECG    NSR - Personally Reviewed  Physical Exam   GEN: Elderly obese WM in No acute distress.   Neck: No JVD Cardiac: RRR, no murmurs, rubs, or gallops.  Respiratory: Clear to auscultation bilaterally. GI: Soft, nontender, non-distended  MS: No edema; No deformity. Neuro:  Nonfocal  Psych: Normal  affect   Labs    Chemistry Recent Labs  Lab 05/19/18 2141 05/21/18 0442 05/21/18 1834 05/22/18 0439  NA 138 134* 134* 137  K 4.4 3.4* 3.5 3.6  CL 104 96* 98 103  CO2 25 26 26 26   GLUCOSE 112* 161* 174* 160*  BUN 10 21 21 20   CREATININE 0.95 1.70* 1.63* 1.33*  CALCIUM 8.9 8.8* 8.7* 8.7*  PROT 5.8*  --   --   --   ALBUMIN 3.7  --   --   --   AST 36  --   --   --   ALT 7  --   --   --   ALKPHOS 56  --   --   --   BILITOT 1.6*  --   --   --   GFRNONAA >60 40* 42* 53*  GFRAA >60 46* 48* >60  ANIONGAP 9 12 10 8      Hematology Recent Labs  Lab 05/20/18 0437 05/21/18 0442 05/22/18 0346  WBC 7.6 10.1 8.0  RBC 4.46 4.60 4.51  HGB 14.8 14.5 14.1  HCT 42.7 44.0 43.5  MCV 95.7 95.7 96.5  MCH 33.2 31.5 31.3  MCHC 34.7 33.0 32.4  RDW 13.2  13.2 13.2  PLT 192 195 176    Cardiac Enzymes Recent Labs  Lab 05/19/18 1549 05/19/18 2310 05/20/18 0437 05/20/18 1103  TROPONINI <0.03 <0.03 <0.03 <0.03   No results for input(s): TROPIPOC in the last 168 hours.   BNP Recent Labs  Lab 05/19/18 1549  BNP 26.2     DDimer No results for input(s): DDIMER in the last 168 hours.   Radiology    No results found.  Cardiac Studies   Cardiac catheterization 10/08/2012:  HEMODYNAMICS:   AO SYSTOLIC/AO DIASTOLIC: 09/60  LV SYSTOLIC/LV DIASTOLIC: 45/40  ANGIOGRAPHIC RESULTS:   1. Left main; normal  2. LAD; patent stent in the proximal LAD with at most 30% smooth in-stent restenosis 3. Left circumflex; nondominant and normal.  4. Right coronary artery; large, dominant and normal 5 Left ventriculography; RAO left ventriculogram was performed using  25 mL of Visipaque dye at 12 mL/second. The overall LVEF estimated  60% Without wall motion abnormalities 6. Supravalvular aortography: Supravalvular aortogram was performed using 20 cc of Visipaque dye at 20 cc per second. The aortic root appeared dilated the bladder there was no dissection and the arch vessels appeared  intact  IMPRESSION:[name] has a widely patent proximal LAD stent with otherwise normal coronary arteries and normal left function. There is no obvious culprit vessel or source of chest pain that is cardiovascular. The sheath was removed and a T-R band was placed on the right wrist to achieve . The patient left the Cath Lab in stable condition. The cardiac enzymes will be cycled. He'll be treated empirically with antibiotics medications.  Patient Profile     73 y.o. male with a history ofcoronary artery disease and prior MI and LAD PCIwho is being seen today for the evaluation ofchest pain concerning for unstable angina.   Assessment & Plan    1. Unstable Angina/CAD: remote h/o MI w/ LAD PCI. Patent LAD stent on cath in 2014 and otherwise normal coronaries. Now with CP concerning for [place]. Troponin negative x 3. Plan for cardiac cath today. Currently CP free. Continue IV heparin, ASA, statin and  blocker  2. HTN: controlled this morning at 124/86. Both his home lisinopril and lasix are on hold due to AKI and soft BP yesterday. Will monitor and plan to resume meds post cath, pending renal function and BP. Continue amlodipine and metoprolol for now.   3. HLD: Lipid panel showed triglycerides 327, HDL 40, LDL 80, and total cholesterol 185.Statin changed from simvastatin to atorvastatin 80 mg yesterday. Although Vascepa is not yet on inpatient formulary,  would recommend providing a prescription at the time of discharge. Repeat FLP and HFTs in 6-8 weeks. LD goal is < 70 mg/dL.   4. Chronic Diastolic CHF: lasix held and pt given precath IVFs for AKI. Lasix remains on hold today. Monitor volume status closely.   5. T2DM: on insulin. Metformin on hold for cath. Will hold 48 hrs post cath.   6. AKI: improved w/ IVFs, SCr down from 1.70>> 1.33 today. Lisinopril and lasix on hold. Monitor renal function closely post cath. May need additional post cath hydration.   8. Hypokalemia: resolved. 3.6  today   9. OSA:    For questions or updates, please contact [place] Please consult [email or web address] for contact info under        Signed, Richard Jester, PA-C  05/22/2018, 8:37 AM

## 2018-05-22 NOTE — Discharge Summary (Addendum)
Discharge Summary    Patient ID: Richard [name] MRN: [id number]; DOB: May 15, 1945  Admit date: 05/19/2018 Discharge date: 05/22/2018  Primary Care Provider: Tammi Sou, MD  Primary Cardiologist: Dr. Lamar Blinks ([facility])>>>Dr. Irish Lack  Primary Electrophysiologist:  None   Discharge Diagnoses    Active Problems:   HTN (hypertension)   HLD (hyperlipidemia)   Unstable angina [facility])   ACS (acute coronary syndrome) ([place])   CAD S/P percutaneous coronary angioplasty   Hypertriglyceridemia, essential   Allergies No Known Allergies  Diagnostic Studies/Procedures    LHC 05/22/18 Procedures   CORONARY STENT INTERVENTION  Intravascular Ultrasound/IVUS  LEFT HEART CATH AND CORONARY ANGIOGRAPHY  Conclusion     The left ventricular systolic function is normal.  LV end diastolic pressure is normal.  The left ventricular ejection fraction is 55-65% by visual estimate.  There is no aortic valve stenosis.  Dist LM lesion is 20% stenosed.  Prox LAD lesion is 75% stenosed. THis was instent restenosis.  A drug-eluting stent was successfully placed using a STENT SYNERGY DES [id number], postdilated to 4.5 mm  Post intervention, there is a 0% residual stenosis. Post procedure IVUS showed well apposed stent.   DAPT for 12 months.  Consider clopidogrel monotherapy after that along with aggressive secondary prevention.     Diagnostic  Dominance: Right    Intervention     _______________________________ 2D Echo 05/21/18  IMPRESSIONS    1. The left ventricle has normal systolic function with an ejection fraction of 60-65%. The cavity size was normal. Left ventricular diastolic Doppler parameters are consistent with impaired relaxation. No evidence of left ventricular regional wall  motion abnormalities.  2. The right ventricle has normal systolic function. The cavity was normal. There is no increase in right ventricular wall thickness. Right ventricular systolic pressure could  not be assessed.  3. No hemodynamically significant valve disease.   History of Present Illness     73 y.o.malewith a history ofcoronary artery disease and prior MI and LAD PCI, [facility] with CC of chest pain concerning for unstable angina.    Per records, he had LAD PCI of the LAD in 2013. He had repeat cath in 2014 that showed patent stent. Was on Effient as opposed to [place] which caused him a headache. Also with DM. Came into the ED w/ CC of chest pain at rest and with minimal activity as well as DOE. EKG showed no active ischemia. Initial troponin was negative. He was hypertensive in the ED and admitted for CP and BP control.   Hospital Course     Pt was admitted to telemetry and placed on IV heparin. Troponin's were cycled and negative x 3. Nitro patch was placed for CP. His home ASA, statin and  blocker blocker were resumed. Metformin held for cath. Echo showed normal LVEF at 60-65%. On 05/22/18, pt underwent cardiac cath, by Dr. Irish Lack. Access obtained via the right radial artery. Cath showed in-stent restenosis of the previously mid LAD stent, 75% stenosis. This was treated with PCI + DES placement. Post intervention, there was 0% residual stenosis. There was also 20% distal LM stenosis. Medical therapy recommended for residual disease. The RCA and LCx were both free of disease. LV gram showed normal LVEF. Pt tolerated the procedure well and left the cath lab in stable condition. He was transferred to post PCI tele until. Radial TR band was placed for hemostasis. He was monitored post cath and had no recurrent  CP and no post cath complications. TR band was deflated and hemostasis achieved with stable radial cath site. He was able to ambulate w/ RN prior to d/c and had no post cath complications. Vital signs remained stable. Dr. Irish Lack felt that he would be stable for same day PCI d/c if no issues. I personally saw pt prior  to d/c and he was stable. Pt will be discharged home on DAPT w/ ASA + Plavix, metoprolol, atorvastatin (simvastatin discontinued, LDL 85 mg/dL, TG 327), lisinopril and amlodipine. Pt was given instruction to hold Metformin 48 hrs post cath. He was also given an Rx for PRN SNL NTG. Also written for Rx for Vesepa for elevated TGs. As noted above, pt was previously being followed by [facility], but recently changed insurance and has requested to transfer his cardiology care to [facility] and requested Dr. Irish Lack. Post hospital f/u will be arranged in 1-2 weeks w/ an APP and he will f/u with Dr. Irish Lack in 3 months. He will need a repeat FLP and HFTs in 6-8 weeks to recheck LDL and TG.  Consultants: none.   Discharge Vitals Blood pressure (!) 152/84, pulse 79, temperature 98 F (36.7 C), temperature source Oral, resp. rate 20, height 5\' 8"  (1.727 m), weight 115.5 kg, SpO2 94 %.  Filed Weights   05/20/18 0425 05/21/18 0425 05/22/18 0348  Weight: 117.5 kg 114.5 kg 115.5 kg    Labs & Radiologic Studies    CBC Recent Labs    05/19/18 2141  05/21/18 0442 05/22/18 0346  WBC 7.6   < > 10.1 8.0  NEUTROABS 4.0  --   --   --   HGB 14.4   < > 14.5 14.1  HCT 41.5   < > 44.0 43.5  MCV 94.3   < > 95.7 96.5  PLT 185   < > 195 176   < > = values in this interval not displayed.   Basic Metabolic Panel Recent Labs    05/21/18 1834 05/22/18 0439  NA 134* 137  K 3.5 3.6  CL 98 103  CO2 26 26  GLUCOSE 174* 160*  BUN 21 20  CREATININE 1.63* 1.33*  CALCIUM 8.7* 8.7*   Liver Function Tests Recent Labs    05/19/18 2141  AST 36  ALT 7  ALKPHOS 56  BILITOT 1.6*  PROT 5.8*  ALBUMIN 3.7   No results for input(s): LIPASE, AMYLASE in the last 72 hours. Cardiac Enzymes Recent Labs    05/19/18 2310 05/20/18 0437 05/20/18 1103  TROPONINI <0.03 <0.03 <0.03   BNP Invalid input(s): POCBNP D-Dimer No results for input(s): DDIMER in the last 72 hours. Hemoglobin A1C Recent Labs      05/21/18 0442  HGBA1C 6.9*   Fasting Lipid Panel Recent Labs    05/21/18 0442  CHOL 185  HDL 40*  LDLCALC 80  TRIG 327*  CHOLHDL 4.6   Thyroid Function Tests Recent Labs    05/19/18 2141  TSH 2.823   _____________  Dg Chest 2 View  Result Date: 05/19/2018 CLINICAL DATA:  73 year old male with acute chest pain EXAM: CHEST - 2 VIEW COMPARISON:  10/08/2012 FINDINGS: The cardiomediastinal silhouette is unremarkable. There is no evidence of focal airspace disease, pulmonary edema, suspicious pulmonary nodule/mass, pleural effusion, or pneumothorax. No acute bony abnormalities are identified. Cervical fusion changes again noted. IMPRESSION: No active cardiopulmonary disease. Electronically Signed   By: Margarette [place] M.D.   On: 05/19/2018 16:04   Disposition   Pt  is being discharged home today in good condition.  Follow-up Plans & Appointments    Follow-up Information    Consuelo Pandy, PA-C Follow up on 05/31/2018.   Specialties:  Cardiology, Radiology Why:  9:30 AM with Dr. Hassell Done PA Contact information: [place] [address] [phone]          Discharge Instructions    AMB Referral to Cardiac Rehabilitation - Phase II   Complete by:  As directed    Diagnosis:   Coronary Stents Stable Angina        Discharge Medications   Allergies as of 05/22/2018   No Known Allergies     Medication List    STOP taking these medications   ibuprofen 200 MG tablet Commonly known as:  ADVIL,MOTRIN   simvastatin 20 MG tablet Commonly known as:  ZOCOR     TAKE these medications   amLODipine 5 MG tablet Commonly known as:  NORVASC Take 5 mg by mouth every evening.   aspirin 81 MG EC tablet Take 1 tablet (81 mg total) by mouth daily. Start taking on:  May 23, 2018 What changed:    how much to take  when to take this   atorvastatin 80 MG tablet Commonly known as:  LIPITOR Take 1 tablet (80 mg total) by mouth daily at 6  PM.   cholecalciferol 1000 units tablet Commonly known as:  VITAMIN D Take 2,000 Units by mouth every evening.   clopidogrel 75 MG tablet Commonly known as:  PLAVIX Take 1 tablet (75 mg total) by mouth daily with breakfast. Start taking on:  May 23, 2018   furosemide 40 MG tablet Commonly known as:  LASIX Take 40 mg by mouth every evening.   gabapentin 300 MG capsule Commonly known as:  NEURONTIN Take 300 mg by mouth 2 (two) times daily.   Icosapent Ethyl 1 g Caps Take 2 capsules (2 g total) by mouth 2 (two) times daily.   lisinopril 20 MG tablet Commonly known as:  PRINIVIL,ZESTRIL Take 20 mg by mouth every evening. .   metFORMIN 500 MG tablet Commonly known as:  GLUCOPHAGE Take 1 tablet (500 mg total) by mouth 2 (two) times daily with a meal. Start taking on:  May 25, 2018 What changed:  These instructions start on May 25, 2018. If you are unsure what to do until then, ask your doctor or other care provider.   metoprolol succinate 50 MG 24 hr tablet Commonly known as:  TOPROL-XL Take 50 mg by mouth every evening.   nitroGLYCERIN 0.4 MG SL tablet Commonly known as:  NITROSTAT Place 1 tablet (0.4 mg total) under the tongue every 5 (five) minutes as needed for chest pain (up to 3 doses). What changed:  Another medication with the same name was added. Make sure you understand how and when to take each.   nitroGLYCERIN 0.4 MG SL tablet Commonly known as:  NITROSTAT Place 1 tablet (0.4 mg total) under the tongue every 5 (five) minutes x 3 doses as needed for chest pain. What changed:  You were already taking a medication with the same name, and this prescription was added. Make sure you understand how and when to take each.   pantoprazole 40 MG tablet Commonly known as:  PROTONIX Take 40 mg by mouth every evening.        Acute coronary syndrome (MI, NSTEMI, STEMI, etc) this admission?: Yes.     AHA/ACC Clinical Performance & Quality Measures:  1. Aspirin  prescribed? - Yes 2. ADP Receptor Inhibitor (Plavix/Clopidogrel, Brilinta/Ticagrelor or Effient/Prasugrel) prescribed (includes medically managed patients)? - Yes 3. Beta Blocker prescribed? - Yes 4. High Intensity Statin (Lipitor 40-80mg  or Crestor 20-40mg ) prescribed? - Yes 5. EF assessed during THIS hospitalization? - Yes 6. For EF <40%, was ACEI/ARB prescribed? - Yes 7. For EF <40%, Aldosterone Antagonist (Spironolactone or Eplerenone) prescribed? - Not Applicable (EF >/= 10%) 8. Cardiac Rehab Phase II ordered (Included Medically managed Patients)? - Yes     Outstanding Labs/Studies   FLP & HFTs    Duration of Discharge Encounter   Greater than 30 minutes including physician time.  Signed, Lyda Jester, PA-C 05/22/2018, 4:47 PM  I have examined the patient and reviewed assessment and plan and discussed with patient.  Agree with above as stated.  S/p LAD stent.  Large stent placed and apposition confirmed with IVUS.    Continue aggressive secondary prevention.  Continue DAPT.  CRI stable. Needs aggressive DM control.   Larae Grooms

## 2018-05-22 NOTE — Progress Notes (Signed)
Progress Note  Patient Name: Richard [name] Date of Encounter: 05/22/2018  Primary Cardiologist: Dr. Lamar Blinks [facility])  Subjective   Currently CP free. No dyspnea. Awaiting cath.   Inpatient Medications    Scheduled Meds: . amLODipine  5 mg Oral Daily  . aspirin EC  81 mg Oral Daily  . atorvastatin  80 mg Oral q1800  . cholecalciferol  2,000 Units Oral Daily  . gabapentin  300 mg Oral QHS  . insulin aspart  0-15 Units Subcutaneous TID WC  . insulin aspart  0-5 Units Subcutaneous QHS  . metoprolol succinate  25 mg Oral Daily  . nitroGLYCERIN  0.2 mg Transdermal Daily  . pantoprazole  40 mg Oral Daily  . sodium chloride flush  3 mL Intravenous Once  . sodium chloride flush  3 mL Intravenous Q12H   Continuous Infusions: . sodium chloride    . sodium chloride 1 mL/kg/hr (05/22/18 0507)  . heparin 1,600 Units/hr (05/22/18 0548)   PRN Meds: sodium chloride, acetaminophen, nitroGLYCERIN, ondansetron (ZOFRAN) IV, sodium chloride flush   Vital Signs    Vitals:   05/21/18 1746 05/21/18 2027 05/22/18 0348 05/22/18 0751  BP: 109/80 111/75 (!) 143/81 124/86  Pulse: 87 79 79 78  Resp: 16 16 19    Temp:  98.1 F (36.7 C) 98.1 F (36.7 C)   TempSrc:  Oral Oral   SpO2:  95% 91%   Weight:   115.5 kg   Height:        Intake/Output Summary (Last 24 hours) at 05/22/2018 0837 Last data filed at 05/22/2018 0700 Gross per 24 hour  Intake 2091.17 ml  Output 676 ml  Net 1415.17 ml   Last 3 Weights 05/22/2018 05/21/2018 05/20/2018  Weight (lbs) 254 lb 9.6 oz 252 lb 8 oz 259 lb  Weight (kg) 115.486 kg 114.533 kg 117.482 kg      Telemetry    NSR - Personally Reviewed  ECG    NSR - Personally Reviewed  Physical Exam   GEN: Elderly obese WM in No acute distress.   Neck: No JVD Cardiac: RRR, no murmurs, rubs, or gallops.  Respiratory: Clear to auscultation bilaterally. GI: Soft, nontender, non-distended  MS: No edema; No deformity. Neuro:  Nonfocal  Psych: Normal  affect   Labs    Chemistry Recent Labs  Lab 05/19/18 2141 05/21/18 0442 05/21/18 1834 05/22/18 0439  NA 138 134* 134* 137  K 4.4 3.4* 3.5 3.6  CL 104 96* 98 103  CO2 25 26 26 26   GLUCOSE 112* 161* 174* 160*  BUN 10 21 21 20   CREATININE 0.95 1.70* 1.63* 1.33*  CALCIUM 8.9 8.8* 8.7* 8.7*  PROT 5.8*  --   --   --   ALBUMIN 3.7  --   --   --   AST 36  --   --   --   ALT 7  --   --   --   ALKPHOS 56  --   --   --   BILITOT 1.6*  --   --   --   GFRNONAA >60 40* 42* 53*  GFRAA >60 46* 48* >60  ANIONGAP 9 12 10 8      Hematology Recent Labs  Lab 05/20/18 0437 05/21/18 0442 05/22/18 0346  WBC 7.6 10.1 8.0  RBC 4.46 4.60 4.51  HGB 14.8 14.5 14.1  HCT 42.7 44.0 43.5  MCV 95.7 95.7 96.5  MCH 33.2 31.5 31.3  MCHC 34.7 33.0 32.4  RDW 13.2  13.2 13.2  PLT 192 195 176    Cardiac Enzymes Recent Labs  Lab 05/19/18 1549 05/19/18 2310 05/20/18 0437 05/20/18 1103  TROPONINI <0.03 <0.03 <0.03 <0.03   No results for input(s): TROPIPOC in the last 168 hours.   BNP Recent Labs  Lab 05/19/18 1549  BNP 26.2     DDimer No results for input(s): DDIMER in the last 168 hours.   Radiology    No results found.  Cardiac Studies   Cardiac catheterization 10/08/2012:  HEMODYNAMICS:   AO SYSTOLIC/AO DIASTOLIC: 51/76  LV SYSTOLIC/LV DIASTOLIC: 16/07  ANGIOGRAPHIC RESULTS:   1. Left main; normal  2. LAD; patent stent in the proximal LAD with at most 30% smooth in-stent restenosis 3. Left circumflex; nondominant and normal.  4. Right coronary artery; large, dominant and normal 5 Left ventriculography; RAO left ventriculogram was performed using  25 mL of Visipaque dye at 12 mL/second. The overall LVEF estimated  60% Without wall motion abnormalities 6. Supravalvular aortography: Supravalvular aortogram was performed using 20 cc of Visipaque dye at 20 cc per second. The aortic root appeared dilated the bladder there was no dissection and the arch vessels appeared  intact  IMPRESSION:Mr. Bresee has a widely patent proximal LAD stent with otherwise normal coronary arteries and normal left function. There is no obvious culprit vessel or source of chest pain that is cardiovascular. The sheath was removed and a T-R band was placed on the right wrist to achieve . The patient left the Cath Lab in stable condition. The cardiac enzymes will be cycled. He'll be treated empirically with antibiotics medications.  Patient Profile     73 y.o. male with a history ofcoronary artery disease and prior MI and LAD PCIwho is being seen today for the evaluation ofchest pain concerning for unstable angina.   Assessment & Plan    1. Unstable Angina/CAD: remote h/o MI w/ LAD PCI. Patent LAD stent on cath in 2014 and otherwise normal coronaries. Now with CP concerning for [place]. Troponin negative x 3. Plan for cardiac cath today. Currently CP free. Continue IV heparin, ASA, statin and  blocker  2. HTN: controlled this morning at 124/86. Both his home lisinopril and lasix are on hold due to AKI and soft BP yesterday. Will monitor and plan to resume meds post cath, pending renal function and BP. Continue amlodipine and metoprolol for now.   3. HLD: Lipid panel showed triglycerides 327, HDL 40, LDL 80, and total cholesterol 185.Statin changed from simvastatin to atorvastatin 80 mg yesterday. Although Vascepa is not yet on inpatient formulary,  would recommend providing a prescription at the time of discharge. Repeat FLP and HFTs in 6-8 weeks. LD goal is < 70 mg/dL.   4. Chronic Diastolic CHF: lasix held and pt given precath IVFs for AKI. Lasix remains on hold today. Monitor volume status closely.   5. T2DM: on insulin. Metformin on hold for cath. Will hold 48 hrs post cath.   6. AKI: improved w/ IVFs, SCr down from 1.70>> 1.33 today. Lisinopril and lasix on hold. Monitor renal function closely post cath. May need additional post cath hydration.   8. Hypokalemia: resolved. 3.6  today   9. OSA:    For questions or updates, please contact [place] Please consult [email or web address] for contact info under        Signed, Lyda Jester, PA-C  05/22/2018, 8:37 AM

## 2018-05-22 NOTE — Progress Notes (Signed)
TR BAND REMOVAL  LOCATION:  right radial  DEFLATED PER PROTOCOL:  Yes.    TIME BAND OFF / DRESSING APPLIED:   1715   SITE UPON ARRIVAL:   Level 0  SITE AFTER BAND REMOVAL:  Level 0  CIRCULATION SENSATION AND MOVEMENT:  Within Normal Limits  Yes.    COMMENTS:  

## 2018-05-22 NOTE — Progress Notes (Signed)
[place] for Heparin Indication: chest pain/ACS  No Known Allergies  Patient Measurements: Height: 5\' 8"  (172.7 cm) Weight: 254 lb 9.6 oz (115.5 kg) IBW/kg (Calculated) : 68.4 Heparin Dosing Weight: 96.3 kg  Vital Signs: Temp: 98.1 F (36.7 C) (03/16 0348) Temp Source: Oral (03/16 0348) BP: 143/81 (03/16 0348) Pulse Rate: 79 (03/16 0348)  Labs: Recent Labs    05/19/18 2310  05/20/18 0437 05/20/18 1103 05/20/18 1420 05/21/18 0442 05/21/18 1834 05/22/18 0346 05/22/18 0439  HGB  --   --  14.8  --   --  14.5  --  14.1  --   HCT  --   --  42.7  --   --  44.0  --  43.5  --   PLT  --   --  192  --   --  195  --  176  --   HEPARINUNFRC  --    < > 0.28*  --  0.32 0.32  --  0.18*  --   CREATININE  --   --   --   --   --  1.70* 1.63*  --  1.33*  TROPONINI <0.03  --  <0.03 <0.03  --   --   --   --   --    < > = values in this interval not displayed.    Estimated Creatinine Clearance: 62.8 mL/min (A) (by C-G formula based on SCr of 1.33 mg/dL (H)).   Assessment: 72 yoM admitted 3/13 with complaints of chest pain. Pharmacy has been consulted for heparin dosing. Patient not on anticoagulation PTA.  Plans noted for cath on 3/16  Heparin level down to 0.18 (subtherapeutic) on gtt at 1350 units/hr. No issues with line or bleeding reported per RN.  Goal of Therapy:  Heparin level 0.3-0.7 units/ml Monitor platelets by anticoagulation protocol: Yes   Plan:  -Increase heparin gtt to 1600 units/hr -Will f/u post cath  Sherlon Handing, PharmD, BCPS Clinical pharmacist  **Pharmacist phone directory can now be found on [email or web address] ([email or web address]).  Listed under [place]. 05/22/2018 5:46 AM

## 2018-05-22 NOTE — Discharge Instructions (Signed)
Do not lift anything heavier than 1/2 gallon of milk for 2 days.   Wait to restart Metformin on 3/19. This is to protect your kidneys, given you just had a procedure that required contrast dye.

## 2018-05-22 NOTE — Progress Notes (Signed)
Patient taken to cath lab via bed with wife and daughter.

## 2018-05-22 NOTE — Patient Outreach (Signed)
  [place] [facility]) Care Management Chronic Special Needs Program  05/22/2018  Name: Richard [name] DOB: 1945/10/17  MRN: [id number]  [name] is enrolled in a chronic special needs plan for  Diabetes. A completed health risk assessment has not been received from the client and client has not responded to outreach attempts by their health care concierge.  Client currently admitted to hospital with unstable angina  The client's individualized care plan was developed based on available data.  Plan:  . Send unsuccessful outreach letter with a copy of individualized care plan to client . Send Scientist, clinical (histocompatibility and immunogenetics) . Send individualized care plan to provider   Interdisciplinary care plan sent to [facility] hospital for admission   Chronic care management coordinator will attempt outreach in 1-3 months.    Peter Garter RN, Jackquline Denmark, CDE Chronic Care Management Coordinator [facility] Care Management [phone]

## 2018-05-23 ENCOUNTER — Other Ambulatory Visit: Payer: Self-pay

## 2018-05-23 ENCOUNTER — Telehealth (HOSPITAL_COMMUNITY): Payer: Self-pay | Admitting: *Deleted

## 2018-05-23 MED FILL — Nitroglycerin IV Soln 100 MCG/ML in D5W: INTRA_ARTERIAL | Qty: 10 | Status: AC

## 2018-05-23 NOTE — Patient Outreach (Signed)
  [facility] [place] [facility]) Care Management Chronic Special Needs Program  05/23/2018  Name: Richard [name] DOB: [birth year]  MRN: [id number]  [name] is enrolled in a chronic special needs plan for Diabetes. Client discharged from hospital on 05/22/18 for unstable angina. Reviewed and updated care plan.    Goals Addressed            This Visit's Progress   . General - Client will not be readmitted within 30 days (C-SNP)         Plan:  Send updsted letter with a copy of their individualized care plan and Send individual care plan to provider  Chronic care management coordinator will outreach in:  1-3 months    [place], [facility] [place] Management [phone]

## 2018-05-24 ENCOUNTER — Telehealth (HOSPITAL_COMMUNITY): Payer: Self-pay | Admitting: Cardiac Rehabilitation

## 2018-05-24 ENCOUNTER — Encounter: Payer: Self-pay | Admitting: Family Medicine

## 2018-05-24 NOTE — Telephone Encounter (Signed)
Phone call to pt to discuss Cardiac Rehab PHASE I instructions.  Education completed including risk factor modification, low fat-low cholesterol diet, exercise, and medication compliance. Pt states his wife prepares his medication for him.   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 Pt oriented to outpatient cardiac rehab.  Although pt not interested,  referral will be sent to [place].  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  Understanding verbalized  Andi Hence, RN, BSN Cardiac Pulmonary Rehab

## 2018-05-24 NOTE — Telephone Encounter (Signed)
Phone call to pt to review Cardiac Rehab PHASE I  education.  Pt discharged home before education completed by PHASE I team.  No answer, left message on answering machine.  Andi Hence, RN, BSN Cardiac Pulmonary Rehab

## 2018-05-30 ENCOUNTER — Telehealth: Payer: Self-pay

## 2018-05-30 ENCOUNTER — Encounter: Payer: Self-pay | Admitting: Cardiology

## 2018-05-30 NOTE — Telephone Encounter (Signed)
   Cardiac Questionnaire:    Since your last visit or hospitalization:    1. Have you been having new or worsening chest pain? no   2. Have you been having new or worsening shortness of breath? yes 3. Have you been having new or worsening leg swelling, wt gain, or increase in abdominal girth (pants fitting more tightly)? no   4. Have you had any passing out spells? No, but dizzy    *A YES to any of these questions would result in the appointment being kept. *If all the answers to these questions are NO, we should indicate that given the current situation regarding the worldwide coronarvirus pandemic, at the recommendation of the CDC, we are looking to limit gatherings in our waiting area, and thus will reschedule their appointment beyond four weeks from today.   _____________   [id number] Pre-Screening Questions:  . Do you currently have a fever? no (yes = cancel and refer to pcp for e-visit) . Have you recently travelled on a cruise, internationally, or to [place], [place], [place], [place], [place], or [place], [place] [employer]) ? no (yes = cancel, stay home, monitor symptoms, and contact pcp or initiate e-visit if symptoms develop) . Have you been in contact with someone that is currently pending confirmation of Covid19 testing or has been confirmed to have the [place] virus?  no (yes = cancel, stay home, away from tested individual, monitor symptoms, and contact pcp or initiate e-visit if symptoms develop) . Are you currently experiencing fatigue or cough? no (yes = pt should be prepared to have a mask placed at the time of their visit).

## 2018-05-31 ENCOUNTER — Ambulatory Visit (INDEPENDENT_AMBULATORY_CARE_PROVIDER_SITE_OTHER): Payer: HMO | Admitting: Cardiology

## 2018-05-31 ENCOUNTER — Encounter: Payer: Self-pay | Admitting: Cardiology

## 2018-05-31 ENCOUNTER — Other Ambulatory Visit: Payer: Self-pay

## 2018-05-31 VITALS — BP 98/70 | HR 75 | Ht 68.0 in | Wt 260.8 lb

## 2018-05-31 DIAGNOSIS — E785 Hyperlipidemia, unspecified: Secondary | ICD-10-CM | POA: Diagnosis not present

## 2018-05-31 MED ORDER — LISINOPRIL 10 MG PO TABS: 10.0000 mg | ORAL_TABLET | Freq: Every day | ORAL | 3 refills | Status: DC

## 2018-05-31 NOTE — Progress Notes (Signed)
05/31/2018 Richard [name]   [birth year]  [id number]  Primary Physician McGowen, Adrian Blackwater, MD Primary Cardiologist: Larae Grooms, MD  Electrophysiologist: None   Reason for Visit/CC: Post hospital follow-up  HPI:  Richard [name] is a 73 y.o. male who is being seen today for post hospital follow-up after recent admission to [facility] for unstable angina, status post PCI.  To summarize his past medical history, he has a history of prior MI andLADPCI, previously followeb by [facility].  He presented to [facility] on 05/20/18 with CC of chest pain concerning for unstable angina.   Per records, he had LAD PCI of the LAD in 2013. He had repeat cath in 2014 that showed patent stent. Was on Effient as opposed to [place] which caused him a headache. Also with DM. Came into the ED w/ CC of chest pain at rest and with minimal activity as well as DOE. EKG showed no active ischemia. Initial troponin was negative. He was hypertensive in the ED and admitted for CP and BP control.   He was admitted to telemetry and cardiac enzymes were cycled and negative x3 ruling out myocardial infarction. Nitro patch was placed for CP. His home ASA, statin and ? blocker blocker were resumed. Metformin held for cath. Echo showed normal LVEF at 60-65%. On 05/22/18, pt underwent cardiac cath, by Dr. Irish Lack. Access obtained via the right radial artery. Cath showed in-stent restenosis of the previously mid LAD stent, 75% stenosis. This was treated with PCI + DES placement. Post intervention, there was 0% residual stenosis. There was also 20% distal LM stenosis. Medical therapy recommended for residual disease. The RCA and LCx were both free of disease. LV gram showed normal LVEF.  He had no recurrent chest pain following PCI.  He was placed on dual antiplatelet therapy with aspirin plus Plavix as well as beta-blocker therapy with metoprolol, continuation of home lisinopril and amlodipine.  He was continued on  statin therapy however he was changed from simvastatin to atorvastatin.  LDL was calculated at 85 mg/dL.  Triglycerides were elevated at 327. Vescepa was recommended but pt unable to afford.   He presents back to clinic today for post hospital f/u. Pt reports that they have done well from a symptom standpoint since hospital d/c. They deny chest pain and dyspnea. No exertional symptoms w/ exercise or ADLs. Pt also denies orthopnea, PND, LEE, palpitations, dizziness, syncope/ near syncope and claudication.   They report full medication compliance. No intolerances or medication side effects.   Cardiac Studies   Current Meds  Medication Sig  . amLODipine (NORVASC) 5 MG tablet Take 5 mg by mouth every evening.   Marland Kitchen aspirin EC 81 MG EC tablet Take 1 tablet (81 mg total) by mouth daily.  Marland Kitchen atorvastatin (LIPITOR) 80 MG tablet Take 1 tablet (80 mg total) by mouth daily at 6 PM.  . cholecalciferol (VITAMIN D) 1000 UNITS tablet Take 2,000 Units by mouth every evening.   . clopidogrel (PLAVIX) 75 MG tablet Take 1 tablet (75 mg total) by mouth daily with breakfast.  . furosemide (LASIX) 40 MG tablet Take 40 mg by mouth every evening.   . gabapentin (NEURONTIN) 300 MG capsule Take 300 mg by mouth 2 (two) times daily.   . metFORMIN (GLUCOPHAGE) 500 MG tablet Take 1 tablet (500 mg total) by mouth 2 (two) times daily with a meal.  . metoprolol succinate (TOPROL-XL) 50 MG 24 hr tablet Take 50 mg by mouth every evening.   Marland Kitchen  nitroGLYCERIN (NITROSTAT) 0.4 MG SL tablet Place 1 tablet (0.4 mg total) under the tongue every 5 (five) minutes as needed for chest pain (up to 3 doses).  . pantoprazole (PROTONIX) 40 MG tablet Take 40 mg by mouth every evening.   . [DISCONTINUED] lisinopril (PRINIVIL,ZESTRIL) 20 MG tablet Take 20 mg by mouth every evening. Marland Kitchen   No Known Allergies Past Medical History:  Diagnosis Date  . Coronary artery disease    a. DES to LAD 04/2011 - took Effient/ASA x 1 yr, now on ASA only. b. Cath  10/2012: patent stent, otherwise normal cors. 05/2018 Prox LAD stent 75% restenosed, DES placed.  Needs DAPT x 1 yr.  . Diabetes mellitus with complication ([place]) 96/0454   A1c 6.9%  . Diabetic peripheral neuropathy ([place])   . GERD (gastroesophageal reflux disease)    HAS HAD ESOPHAGUS STRETCHED SEVERAL TIMES IN THE PAST  . Hyperlipidemia    goal LDL < 70  . Hypertension   . Myocardial infarction ([place])   . OSA on CPAP 10/18/2011   doesn't wear it  . Right knee pain    TORN RIGHT KNEE MEDIAL MENSICAL TEAR  . Shortness of breath    WITH EXERTION  . Syncope    a. 10/2012.   Family History  Problem Relation Age of Onset  . Arthritis Mother   . Cancer Father    Past Surgical History:  Procedure Laterality Date  . CARDIAC CATHETERIZATION  06/2017   In-stent restenosis cleared out  . CARDIOVASCULAR STRESS TEST     Latter part of 2019--normal per cardiologist's note from 10/2017  . CERVICAL FUSION  1990 and 1993   X 2   SURGERIES    SLIGHT LIMITATION ROM  . COLONOSCOPY  11/24/2004; 2018   NORMAL.  Recall 2016.  Repeat 2018 normal per pt.  . CORONARY ANGIOPLASTY WITH STENT PLACEMENT    . CORONARY STENT INTERVENTION N/A 05/22/2018   Prox LAD for in stent restenosis.  DAPT x 1 yr.  Procedure: CORONARY STENT INTERVENTION;  Surgeon: Jettie Booze, MD;  Location: [place] CV LAB;  Service: Cardiovascular;  Laterality: N/A;  . INTRAVASCULAR ULTRASOUND/IVUS N/A 05/22/2018   Procedure: Intravascular Ultrasound/IVUS;  Surgeon: Jettie Booze, MD;  Location: [place] CV LAB;  Service: Cardiovascular;  Laterality: N/A;  . KNEE ARTHROSCOPY  10/22/2011   Procedure: ARTHROSCOPY KNEE;  Surgeon: Gearlean Alf, MD;  Location: [facility];  Service: Orthopedics;  Laterality: Right;  Right knee scope with debridement  . LAPAROSCOPIC CHOLECYSTECTOMY  2018  . LEFT HEART CATH AND CORONARY ANGIOGRAPHY N/A 05/22/2018   DES placed for in stent restenosis.  EF 55-60%.  Procedure: LEFT HEART CATH AND  CORONARY ANGIOGRAPHY;  Surgeon: Jettie Booze, MD;  Location: [place] CV LAB;  Service: Cardiovascular;  Laterality: N/A;  . LEFT HEART CATHETERIZATION WITH CORONARY ANGIOGRAM N/A 10/08/2012   Procedure: LEFT HEART CATHETERIZATION WITH CORONARY ANGIOGRAM;  Surgeon: Lorretta Harp, MD;  Location: [facility] CATH LAB;  Service: Cardiovascular;  Laterality: N/A;  . NASAL SINUS SURGERY     ONE SINUS SURGERY THRU NOSE AND ANOTHER SINUS SURGERY THRU INCISION ABOVE RT EYE  . TRANSTHORACIC ECHOCARDIOGRAM  05/21/2018   EF 60-65%, grd I DD, no valvular probs.   Social History   Socioeconomic History  . Marital status: Married    Spouse name: [name]  . Number of children: Not on file  . Years of education: Not on file  . Highest education level: Not on file  Occupational History  . Not on file  Social Needs  . Financial resource strain: Not on file  . Food insecurity:    Worry: Not on file    Inability: Not on file  . Transportation needs:    Medical: Not on file    Non-medical: Not on file  Tobacco Use  . Smoking status: Never Smoker  . Smokeless tobacco: Former Systems developer    Types: Chew  Substance and Sexual Activity  . Alcohol use: No  . Drug use: No  . Sexual activity: Not on file  Lifestyle  . Physical activity:    Days per week: Not on file    Minutes per session: Not on file  . Stress: Not on file  Relationships  . Social connections:    Talks on phone: Not on file    Gets together: Not on file    Attends religious service: Not on file    Active member of club or organization: Not on file    Attends meetings of clubs or organizations: Not on file    Relationship status: Not on file  . Intimate partner violence:    Fear of current or ex partner: Not on file    Emotionally abused: Not on file    Physically abused: Not on file    Forced sexual activity: Not on file  Other Topics Concern  . Not on file  Social History Narrative   Married, 2 daughters.  2 other daughters  deceased.   Educ: HS   Occup: farmer, retired from [employer], Theme park manager at [employer].   No T/A/Ds     Lipid Panel     Component Value Date/Time   CHOL 185 05/21/2018 0442   TRIG 327 (H) 05/21/2018 0442   HDL 40 (L) 05/21/2018 0442   CHOLHDL 4.6 05/21/2018 0442   VLDL 65 (H) 05/21/2018 0442   LDLCALC 80 05/21/2018 0442    Review of Systems: General: negative for chills, fever, night sweats or weight changes.  Cardiovascular: negative for chest pain, dyspnea on exertion, edema, orthopnea, palpitations, paroxysmal nocturnal dyspnea or shortness of breath Dermatological: negative for rash Respiratory: negative for cough or wheezing Urologic: negative for hematuria Abdominal: negative for nausea, vomiting, diarrhea, bright red blood per rectum, melena, or hematemesis Neurologic: negative for visual changes, syncope, or dizziness All other systems reviewed and are otherwise negative except as noted above.   Physical Exam:  Blood pressure 98/70, pulse 75, height 5\' 8"  (1.727 m), weight 260 lb 12.8 oz (118.3 kg), SpO2 95 %.  General appearance: alert, cooperative, no distress and moderately obese Neck: no carotid bruit and no JVD Lungs: clear to auscultation bilaterally Heart: regular rate and rhythm, S1, S2 normal, no murmur, click, rub or gallop Extremities: extremities normal, atraumatic, no cyanosis or edema Pulses: 2+ and symmetric Skin: Skin color, texture, turgor normal. No rashes or lesions Neurologic: Grossly normal  EKG not performed -- personally reviewed   ASSESSMENT AND PLAN:   1. CAD: Status post remote LAD PCI with recent cardiac catheterization showing  in-stent restenosis of the previously mid LAD stent, 75% stenosis. This was treated with PCI + DES placement.  The RCA and left circumflex territories were free of disease.  Left ventricular systolic function normal.  He denies any further anginal symptoms since stent placement.  Continue dual antiplatelet therapy  with aspirin and Plavix for a minimum of 12 months along with high potency statin, beta-blocker and ACE inhibitor.  2. HLD: Recent lipid panel showed  elevated LDL at 80 mg/dL.  Triglycerides were also notably elevated at 327.  We have changed his statin from simvastatin to atorvastatin. LDL goal < 70 mg/dL. We did recommend  Vescepa for hypertriglyceridemia however patient is unable to afford Vescepa.  We discussed lifestyle modification including increasing physical activity for weight loss as well as low-fat low-carb diet.  We will repeat fasting lipid panel and hepatic function test in 8 weeks.  3. HTN: Blood pressure has actually been running soft and he has had occasional dizziness but denies syncope/near syncope.  Blood pressure today in clinic is 98/70.  We will reduce the dose of his lisinopril down from 20 to 10 mg daily.  4. T2DM: Followed by PCP.  On metformin.  5. Obesity: Body mass index is 39.65 kg/m.  Patient primarily with truncal obesity.  We discussed lifestyle modification given his cardiac issues and other risk factors including increasing physical activity for weight loss as well advised low-fat low-carb diet.  I recommended walking for exercise.  [place].  I encouraged that he try walking 30 minutes a day for exercise.    Follow-Up w/ Dr. Irish Lack in 3-4 months.  [address] Ladoris Gene, MHS [facility] 05/31/2018 10:16 AM

## 2018-05-31 NOTE — Patient Instructions (Signed)
Medication Instructions:  DECREASE LISINOPRIL TO 10 mg DAILY If you need a refill on your cardiac medications before your next appointment, please call your pharmacy.   Lab work:PLEASE SCHEDULE TO BE DONE IN 8 WEEKS FLP HFT If you have labs (blood work) drawn today and your tests are completely normal, you will receive your results only by: Marland Kitchen MyChart Message (if you have MyChart) OR . A paper copy in the mail If you have any lab test that is abnormal or we need to change your treatment, we will call you to review the results.  Testing/Procedures: NONE  Follow-Up:3-4 MONTHS WITH Dr Irish Lack At [facility], you and your health needs are our priority.  As part of our continuing mission to provide you with exceptional heart care, we have created designated Provider Care Teams.  These Care Teams include your primary Cardiologist (physician) and Advanced Practice Providers (APPs -  Physician Assistants and Nurse Practitioners) who all work together to provide you with the care you need, when you need it. .   Any Other Special Instructions Will Be Listed Below (If Applicable).

## 2018-06-02 ENCOUNTER — Telehealth: Payer: Self-pay | Admitting: Cardiology

## 2018-06-02 NOTE — Telephone Encounter (Signed)
Rhonda  with [employer] called, when nurse reviewing his medication discovered amLODipine (NORVASC) 5 MG tablet 1once daily but does not have it in his home.  Also he does have and just got filled Icosatent Ethyl 2g 2x daily just wants to make her aware from the 3/25 note looks like he just completed it the question is should he continue taking it.   Please give patient call to go over medication.

## 2018-06-02 NOTE — Telephone Encounter (Signed)
lpmtcb 3/27

## 2018-06-04 ENCOUNTER — Encounter: Payer: Self-pay | Admitting: Family Medicine

## 2018-06-05 ENCOUNTER — Telehealth: Payer: Self-pay | Admitting: Family Medicine

## 2018-06-05 ENCOUNTER — Encounter: Payer: Self-pay | Admitting: Family Medicine

## 2018-06-05 MED ORDER — GABAPENTIN 300 MG PO CAPS: 300.0000 mg | ORAL_CAPSULE | Freq: Two times a day (BID) | ORAL | 3 refills | Status: DC

## 2018-06-05 MED ORDER — ICOSAPENT ETHYL 1 G PO CAPS: 2.0000 g | ORAL_CAPSULE | Freq: Two times a day (BID) | ORAL | 3 refills | Status: DC

## 2018-06-05 NOTE — Telephone Encounter (Signed)
No o/v needed for this. I went ahead and eRx'd gabapentin just now.

## 2018-06-05 NOTE — Telephone Encounter (Signed)
Patient's last OV was 05/19/18 and only taking Metformin. Did not see mention for need of glucometer?   Please advise, thanks.

## 2018-06-05 NOTE — Telephone Encounter (Signed)
Left detailed message on phone for pt to CB to schedule video visit with DR McGowen as last OV notes instructed patient to follow up after ED evaluation.  Okay per DPR.

## 2018-06-05 NOTE — Telephone Encounter (Signed)
Copied from [place] [id number]. Topic: Quick Communication - See Telephone Encounter >> Jun 05, 2018 10:07 AM Rayann Heman wrote: CRM for notification. See Telephone encounter for: 06/05/18.Faythe Dingwall with [employer] called and left a voicemail that stated pt is taking metFORMIN. She states that patient does not have a glucometer. She would like to know if the patient needs this. Please advise

## 2018-06-05 NOTE — Telephone Encounter (Signed)
Called [place] and advised of PCP recommendations. She expressed understanding.

## 2018-06-05 NOTE — Telephone Encounter (Signed)
Copied from [place] [id number]. Topic: Quick Communication - Rx Refill/Question >> Jun 05, 2018 10:12 AM Andria Frames L wrote: Medication: gabapentin (NEURONTIN) 300 MG capsule [[id number]]   Has the patient contacted their pharmacy? No Preferred Pharmacy (with phone number or street name): [id number] [place], [place] [phone] (Phone) [phone] (Fax)   Agent: Please be advised that RX refills may take up to 3 business days. We ask that you follow-up with your pharmacy.

## 2018-06-05 NOTE — Telephone Encounter (Signed)
I spoke to the patient and his wife, reviewing medications.  I informed them to stop Amlodipine and remain on Atorvastatin.  They also recently refilled Vascepa, but will call when nearing refill to recheck pricing.  They both verbalized understanding and were thankful for the call.

## 2018-06-05 NOTE — Telephone Encounter (Signed)
Patient requesting RF of gabapentin.  Not previously filled by Dr Anitra Lauth.    Last OV note states that patient to follow up based upon ED evaluation.  Please advise if patient needs OV before receiving rf.

## 2018-06-05 NOTE — Telephone Encounter (Signed)
No glucometer needed in his case unless patient specifically requests one.

## 2018-06-09 ENCOUNTER — Telehealth: Payer: Self-pay | Admitting: Interventional Cardiology

## 2018-06-09 MED ORDER — LISINOPRIL 20 MG PO TABS: 20.0000 mg | ORAL_TABLET | Freq: Every day | ORAL | 3 refills | Status: DC

## 2018-06-09 NOTE — Telephone Encounter (Signed)
I spoke with the patient informing him to increase his Lisinopril to 20 mg daily, as it was in the past.  I reminded him that the BP goal is < 130/80.  He will monitor his BP daily and call us next week with an update.

## 2018-06-09 NOTE — Telephone Encounter (Signed)
I spoke to the patient after receiving a call from [facility]) who called with some concerns.  The patient told me that he has been dizzy at times with a little SOB, no CP or any other symptoms.  He checked his BP and HR today (154/76 and 71 bpm).    We made some medication adjustments at his OV on 3/25.  He wanted to get out today and do some yard work, which I said should be ok as long as he monitors his symptoms and to stop if symptoms escalate and that he needs to start off slow.  He will call to update, if intolerable.

## 2018-06-09 NOTE — Telephone Encounter (Signed)
Since BP is going back up, he should increase lisinopril back to original dose, 20 mg daily. Monitor BP after increase and notify us if any issues. That BP is too high. Goal is < 130/80.

## 2018-06-09 NOTE — Telephone Encounter (Signed)
New message    STAT if patient feels like he/she is going to faint   1) Are you dizzy now? No   2) Do you feel faint or have you passed out? No   3) Do you have any other symptoms? Dizziness and sob  Have you checked your HR and BP (record if available)? 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 Please contact the patient to discuss.

## 2018-06-23 ENCOUNTER — Ambulatory Visit: Payer: Self-pay

## 2018-06-28 ENCOUNTER — Other Ambulatory Visit: Payer: Self-pay

## 2018-06-28 NOTE — Addendum Note (Signed)
Addended by: Dimitri Ped on: 06/28/2018 02:23 PM   Modules accepted: Orders

## 2018-06-28 NOTE — Patient Outreach (Signed)
[facility]) Care Management Chronic Special Needs Program  06/28/2018  Name: TRENELL CONCANNON DOB: [birth year]  MRN: [id number]  Mr. [place] is enrolled in a chronic special needs plan for Diabetes. Chronic Care Management Coordinator telephoned client to review health risk assessment and to develop individualized care plan.  Introduced the chronic care management program, importance of client participation, and taking their care plan to all provider appointments and inpatient facilities.  Reviewed the transition of care process and possible referral to community care management.  Subjective:  Client states that he has been doing good since his hospitalization.  States he is checking his B/P daily.  States it is still running a little higher than the 130/80 that his doctor would like.  States his wife is keeping a log of his readings and she will call MD with results.  States his diabetes is under control and he does not need to check his blood sugars.  States that his wife cooks low sodium healthy meals for them.  States his fish oil pill is too expensive so he does not want to get it refilled  Goals Addressed            This Visit's Progress   . Client understands the importance of follow-up with providers by attending scheduled visits   On track   . Client will report abillity to obtain Medications within 3 months       [employer] scientist (medical) will call you    . Client will use Assistive Devices as needed and verbalize understanding of device use   On track   . Client will verbalize knowledge of self management of Hypertension as evidences by BP reading of 130/80 or less; or as defined by provider   On track   . Decrease inpatient admissions/ readmissions with in the next year   On track   . Decrease inpatient diabetes admissions/readmissions with in the year   On track   . Decrease the use of hospital emergency department related to diabetes within the next  year    On track   . COMPLETED: General - Client will not be readmitted within 30 days (C-SNP)       No readmissions    . HEMOGLOBIN A1C < 7.0       Diabetes self management actions:  Glucose monitoring per provider recommendations  Eat Healthy  Check feet daily  Visit provider every 3-6 months as directed  Hbg A1C level every 3-6 months.  Eye Exam yearly    . Maintain timely refills of diabetic medication as prescribed within the year .   On track   . Obtain annual  Lipid Profile, LDL-C   On track   . Obtain Annual Eye (retinal)  Exam    On track   . Obtain Annual Foot Exam   On track   . Obtain annual screen for micro albuminuria (urine) , nephropathy (kidney problems)   On track   . Obtain Hemoglobin A1C at least 2 times per year   On track   . Visit Primary Care Provider or Endocrinologist at least 2 times per year    On track     Client is  meeting diabetes self management goal of hemoglobin A1C of <7% with last reading of 6.9%.  Client is not up to date with annual eye exam and foot exam.  Client is agreeable to referral to pharmacy for assistance with cost of Vascepa.  Client declines referral  to social work for assist with Advanced Directives  Encouraged to call B/P readings to provider Discussed COVID19 cause, symptoms, precautions (social distancing, stay at home order, hand washing), confirmed client knows how to contact provider. Plan:  Send successful outreach letter with a copy of their individualized care plan, Send individual care plan to provider and Send educational material  Chronic care management coordination will outreach in:  6-7  Months  Will refer client to:  Pharmacy   Peter Garter RN, [facility] [place] Management [phone]

## 2018-06-29 ENCOUNTER — Other Ambulatory Visit: Payer: Self-pay | Admitting: Pharmacist

## 2018-06-29 NOTE — Patient Outreach (Addendum)
[facility]) Care Management  [place]   06/30/2018  JABARRI STEFANELLI Apr 09, 1945 [id number]  Reason for referral: Medication Assistance with Vascepa  Referral source: [facility] RN with [employer] C-SNP Current insurance: [employer] C-[employer]  Per review of patient assistance programs online: -[employer]: hypercholesterolemia program is currently open.  -PAN foundation is closed.   -No other programs identified for cost-savings.    PMHx includes but not limited to:  Unstable angina / CAD with remote hx LAD PCI, recent hospitalization for CP, found to have LAD 75% restenosis treated with PCI + DES,, also HTN, HLD, CHF (EF 55-65% 05/22/18), T2DM -Med changes after hospitalization include new RX Vascepa and simvastatin --> atorvastatin  Outreach:  Unsuccessful telephone call attempt #1 to patient. HIPAA compliant voicemail left requesting a return call  Plan:  -I will mail patient an unsuccessful outreach letter.  -I will make another outreach attempt to patient within 3-4 business days.    Ralene Bathe, PharmD, [place] [phone]   Addendum:  Incoming call and voicemail received from patient's spouse, [name].  Return call placed successfully to [place].  She states she organizes and manages patient's medications and is the best person to discuss medication assistance with regarding Vascepa.    [name] reports prescription for Vascepa cost $90 for 30 day supply.  Patient does currently have medication but cannot continue to pay for this each month.  She is interested in finding out if there are any patient assistance programs for this medication.   Objective: Lab Results  Component Value Date   CREATININE 1.33 (H) 05/22/2018   CREATININE 1.63 (H) 05/21/2018   CREATININE 1.70 (H) 05/21/2018    Lab Results  Component Value Date   HGBA1C 6.9 (H) 05/21/2018    Lipid  Panel     Component Value Date/Time   CHOL 185 05/21/2018 0442   TRIG 327 (H) 05/21/2018 0442   HDL 40 (L) 05/21/2018 0442   CHOLHDL 4.6 05/21/2018 0442   VLDL 65 (H) 05/21/2018 0442   LDLCALC 80 05/21/2018 0442    BP Readings from Last 3 Encounters:  05/31/18 98/70  05/22/18 120/79  05/19/18 (!) 159/93    No Known Allergies  Medications Reviewed Today    Reviewed by Dimitri Ped, RN (Registered Nurse) on 06/28/18 at [place] List Status: <None>  Medication Order Taking? Sig Documenting Provider Last Dose Status Informant  aspirin EC 81 MG EC tablet [id number] No Take 1 tablet (81 mg total) by mouth daily. Lyda Jester M, PA-C Taking Active   atorvastatin (LIPITOR) 80 MG tablet [id number] No Take 1 tablet (80 mg total) by mouth daily at 6 PM. Consuelo Pandy, PA-C Taking Active   cholecalciferol (VITAMIN D) 1000 UNITS tablet [id number] No Take 2,000 Units by mouth every evening.  [provider] Taking Active Spouse/Significant Other  clopidogrel (PLAVIX) 75 MG tablet [id number] No Take 1 tablet (75 mg total) by mouth daily with breakfast. Consuelo Pandy, PA-C Taking Active   furosemide (LASIX) 40 MG tablet [id number] No Take 40 mg by mouth every evening.  [provider] Taking Active Spouse/Significant Other  gabapentin (NEURONTIN) 300 MG capsule [id number]  Take 1 capsule (300 mg total) by mouth 2 (two) times daily. Tammi Sou, MD  Active   Icosapent Ethyl 1 g CAPS [id number]  Take 2 capsules (2 g total) by mouth 2 (two) times daily. Consuelo Pandy, PA-C  Active  lisinopril (PRINIVIL,ZESTRIL) 20 MG tablet [id number]  Take 1 tablet (20 mg total) by mouth daily. Lyda Jester M, PA-C  Active   metFORMIN (GLUCOPHAGE) 500 MG tablet [id number] No Take 1 tablet (500 mg total) by mouth 2 (two) times daily with a meal. Lyda Jester M, PA-C Taking Active   metoprolol succinate (TOPROL-XL) 50 MG 24 hr tablet [id number] No Take 50 mg by  mouth every evening.  [provider] Taking Active Spouse/Significant Other  nitroGLYCERIN (NITROSTAT) 0.4 MG SL tablet [id number] No Place 1 tablet (0.4 mg total) under the tongue every 5 (five) minutes as needed for chest pain (up to 3 doses). [name], PA-C Taking Active Spouse/Significant Other  pantoprazole (PROTONIX) 40 MG tablet [id number] No Take 40 mg by mouth every evening.  [provider] Taking Active Spouse/Significant Other          Assessment:  Drugs sorted by system:  Neurologic/Psychologic: gabapantin  Cardiovascular: aspirin 81mg , atorvastatin, clopidogrel, furosemide, vascepa, lisinopril, metoprolol succinate, SL NTG  Gastrointestinal: pantoprazole  Endocrine: metformin  Vitamins/Minerals/Supplements: cholecalciferol  Medication Review Findings:  -Not currently taking atorvastatin, spouse was not aware patient should be on this medication (changed from simvastatin --> atorvastatin after recent hospitalization) -Needs refill on gabapentin  Per [facility], [facility]  Prescription picked up on 05/23/2018 for 90 day supply.  Updated spouse who has misplaced medication and will look for it around the house.    Medication Assistance Findings:  Medication assistance needs identified.  : Vascepa  Extra Help:  Not eligible for Extra Help Low Income Subsidy based on reported income and assets  Patient Assistance Programs: None available for [employer] programs:  [employer] has hypercholesterolemia program open with funding for $2500 / year.  Reviewed program in detail with spouse.  Offered to complete application online for patient but spouse declined.  Provided her with website and phone number for her to complete application and provided her with name and phone number for cardiologist.  Spouse reports she will complete application tomorrow.   Tier Exception Request: Per  review of [employer] formulary, Vascepa is Tier 4 medication.  Could request Tier Exception to reduce to Tier 3.     Plan: . Will fax Tier Exception Request form for Vascepa to Dr. Irish Lack . Will contact Dr. Irish Lack re: atorvastatin and Dr. Anitra Lauth re: gabapentin  . Will f/u in 1-2 weeks with patient and spouse  Ralene Bathe, PharmD, [place] [phone]

## 2018-06-30 ENCOUNTER — Telehealth: Payer: Self-pay

## 2018-06-30 NOTE — Telephone Encounter (Signed)
**Note De-Identified Marlisha Vanwyk Obfuscation** I attempted to do a tier exception on the pts Vascepa through covermymeds but could not.  I called [employer] and s/w Song who advised me that her computer is updating so we cannot do the tier exception at this time. Jerl Santos states that she will call me back once her computer is finished updating. I have advised her that if she cannot call me back by 3 pm today I will call them back on Monday 4/27 to do the Vascepa tier exeption.  Song verbalized understanding.

## 2018-06-30 NOTE — Telephone Encounter (Signed)
Song from [place] called me back. She transferred my call to a pharmacist, Meagan. We did the Vascepa tier exception over the phone and per Meagan this tier exception has been approved and that they will fax [place] an approval letter with details of the approval.  I thanked her for her help.

## 2018-07-03 NOTE — Telephone Encounter (Addendum)
**Note De-Identified Richard [name] Obfuscation** Letter received from [employer] stating that the pts Vascepa PA is good until 03/08/2019.  [facility] of [facility].

## 2018-07-04 ENCOUNTER — Ambulatory Visit: Payer: Self-pay | Admitting: Pharmacist

## 2018-07-04 ENCOUNTER — Other Ambulatory Visit: Payer: Self-pay | Admitting: Pharmacist

## 2018-07-04 NOTE — Patient Outreach (Signed)
[facility]) Care Management  [place] 07/04/2018  DIAZ CRAGO May 22, 1945 [id number]  Reason for call: f/u on Vascepa  Per review of notes in [facility], patient approved for Tier Exception for Vascepa - will now be Tier 3 ($45 / 30 days or $90 / 90 days).    Outreach: Unsuccessful outreach to Mr. Chanc Kervin and spouse [name] for update on [employer] program.   HIPAA compliant voicemail left.   Plan: Will f/u again in 3-4 business days.   Ralene Bathe, PharmD, [place] [phone]

## 2018-07-05 ENCOUNTER — Other Ambulatory Visit: Payer: Self-pay

## 2018-07-05 NOTE — Patient Outreach (Signed)
  [facility]) Care Management Chronic Special Needs Program  07/05/2018  Name: Richard [name] DOB: [birth year]  MRN: [id number]  [name] is enrolled in a chronic special needs plan for Diabetes.  Client called to follow up on Nurse Advise call today for c/o increased B/P. No answer and HIPAA compliant message left. Plan for attempt outreach to client tomorrow if call not returned Chronic care management coordinator will attempt outreach tomorrow   Peter Garter RN, [facility], [place] Management [phone]

## 2018-07-07 ENCOUNTER — Ambulatory Visit: Payer: Self-pay | Admitting: Pharmacist

## 2018-07-07 ENCOUNTER — Other Ambulatory Visit: Payer: Self-pay | Admitting: Pharmacist

## 2018-07-07 NOTE — Patient Outreach (Signed)
[facility]) Care Management  [place] 07/07/2018  Richard [name] 22-Mar-1945 [id number]  Reason for call: f/u on Vascepa  Per review of notes in [facility], patient approved for Tier Exception for Vascepa - will now be Tier 3 ($45 / 30 days or $90 / 90 days).   Outreach: Successful call with [name], spouse of [name].  [name] reports that a family member passed away last week and they have not had a chance to contact [employer] yet about Vascepa.  I provided contact information again for this organization to spouse. She reports she will call later today or when she has time.  She declines my assistance with filling out application online today. She reports she did find patient's atorvastatin and patient has started taking this again.  No other medication questions or concerns from spouse at this time.   Plan: Will close [place] case but am happy to assist again in the future as needed.   Ralene Bathe, PharmD, [place] [phone]

## 2018-07-26 ENCOUNTER — Other Ambulatory Visit: Payer: HMO

## 2018-07-28 ENCOUNTER — Telehealth: Payer: Self-pay

## 2018-07-28 NOTE — Telephone Encounter (Signed)
Received RF request for Simvastatin 20mg . Not previously filled by Dr Anitra Lauth. Last OV note states that patient to follow up based upon ED evaluation. Pt was switched from Simvastatin to Atorvastatin 80mg . Pt reported not taking on 06/30/18.

## 2018-08-07 ENCOUNTER — Telehealth: Payer: Self-pay

## 2018-08-07 NOTE — Telephone Encounter (Signed)
Called pt about possible evisit. Left message asking pt to call the office.

## 2018-08-08 ENCOUNTER — Telehealth: Payer: Self-pay

## 2018-08-08 NOTE — Telephone Encounter (Signed)

## 2018-08-09 NOTE — Progress Notes (Signed)
Virtual Visit via Video Note   This visit type was conducted due to national recommendations for restrictions regarding the COVID-19 Pandemic (e.g. social distancing) in an effort to limit this patient's exposure and mitigate transmission in our community.  Due to his co-morbid illnesses, this patient is at least at moderate risk for complications without adequate follow up.  This format is felt to be most appropriate for this patient at this time.  All issues noted in this document were discussed and addressed.  A limited physical exam was performed with this format.  Please refer to the patient's chart for his consent to telehealth for [facility].   Patient unable to access camera  Date:  08/11/2018   ID:  Richard [name], DOB 14-Aug-1945, MRN [id number]  Patient Location: Home Provider Location: Home  PCP:  Tammi Sou, MD  Cardiologist:  Larae Grooms, MD  Electrophysiologist:  None   Evaluation Performed:  Follow-Up Visit  Chief Complaint:  CAD  History of Present Illness:    Richard COUSER is a 73 y.o. male who has a history of prior MI andLADPCI, previously followed by [facility].  [facility] with CC ofchest pain concerning for unstable angina.  Per records, he had LAD PCI of the LAD in 2013. He had repeat cath in 2014 that showed patent stent.Was onEffient as opposed toBrillanta which caused him a headache. Also with DM.Came into the ED w/ CC of chest pain at rest and with minimal activity as well as DOE. EKG showed no active ischemia. Initial troponin was negative. He was hypertensive in the ED and admitted for CP and BP control.  He was admitted to telemetry and cardiac enzymes were cycled and negative x3 ruling out myocardial infarction. Nitro patch was placed for CP. His home ASA, statin and?blocker blocker were resumed. Metformin held for cath. Echo showed normal LVEF at 60-65%. On 05/22/18, pt underwent cardiac cath. Access  obtained via the right radial artery. Cath showed in-stent restenosis of the previously mid LAD stent, 75% stenosis. This was treated with PCI + DES placement. Post intervention, there was 0% residual stenosis. There was also 20% distal LM stenosis. Medical therapy recommended for residual disease. The RCA and LCx were both free of disease. LV gram showed normal LVEF.  He was placed on dual antiplatelet therapy with aspirin plus Plavix as well as beta-blocker therapy with metoprolol, continuation of home lisinopril and amlodipine.  He was continued on statin therapy however he was changed from simvastatin to atorvastatin.  LDL was calculated at 85 mg/dL.  Triglycerides were elevated at 327. Vescepa was recommended but pt unable to afford.    The patient does not have symptoms concerning for COVID-19 infection (fever, chills, cough, or new shortness of breath).   He is a Environmental education officer.  Not shaking hands.  Trying to keep his distance.  Denies :  Dizziness. Leg edema. Nitroglycerin use. Orthopnea. Palpitations. Paroxysmal nocturnal dyspnea. Shortness of breath. Syncope.   BP has been running high.     Past Medical History:  Diagnosis Date  . Coronary artery disease    a. DES to LAD 04/2011 - took Effient/ASA x 1 yr, now on ASA only. b. Cath 10/2012: patent stent, otherwise normal cors. 05/2018 Prox LAD stent 75% restenosed, DES placed.  Needs DAPT x 1 yr, then possible PLAVIX only.  . Diabetes mellitus with complication ([place]) 43/3295   A1c 6.9%  . Diabetic peripheral neuropathy ([place])   . GERD (  gastroesophageal reflux disease)    HAS HAD ESOPHAGUS STRETCHED SEVERAL TIMES IN THE PAST  . Hyperlipidemia    goal LDL < 70  . Hypertension   . Myocardial infarction ([place])   . OSA on CPAP 10/18/2011   doesn't wear it  . Right knee pain    TORN RIGHT KNEE MEDIAL MENSICAL TEAR  . Shortness of breath    WITH EXERTION  . Syncope    a. 10/2012.   Past Surgical History:  Procedure Laterality Date  .  CARDIAC CATHETERIZATION  06/2017   In-stent restenosis cleared out  . CARDIOVASCULAR STRESS TEST     Latter part of 2019--normal per cardiologist's note from 10/2017  . CERVICAL FUSION  1990 and 1993   X 2   SURGERIES    SLIGHT LIMITATION ROM  . COLONOSCOPY  11/24/2004; 2018   NORMAL.  Recall 2016.  Repeat 2018 normal per pt.  . CORONARY ANGIOPLASTY WITH STENT PLACEMENT    . CORONARY STENT INTERVENTION N/A 05/22/2018   Prox LAD for in stent restenosis.  DAPT x 1 yr.  Procedure: CORONARY STENT INTERVENTION;  Surgeon: Jettie Booze, MD;  Location: [facility] CV LAB;  Service: Cardiovascular;  Laterality: N/A;  . INTRAVASCULAR ULTRASOUND/IVUS N/A 05/22/2018   Procedure: Intravascular Ultrasound/IVUS;  Surgeon: Jettie Booze, MD;  Location: [facility];  Service: Cardiovascular;  Laterality: N/A;  . KNEE ARTHROSCOPY  10/22/2011   Procedure: ARTHROSCOPY KNEE;  Surgeon: Gearlean Alf, MD;  Location: [facility]S;  Service: Orthopedics;  Laterality: Right;  Right knee scope with debridement  . LAPAROSCOPIC CHOLECYSTECTOMY  2018  . LEFT HEART CATH AND CORONARY ANGIOGRAPHY N/A 05/22/2018   DES placed for in stent restenosis.  EF 55-60%.  Procedure: LEFT HEART CATH AND CORONARY ANGIOGRAPHY;  Surgeon: Jettie Booze, MD;  Location: [facility] CV [facility];  Service: Cardiovascular;  Laterality: N/A;  . LEFT HEART CATHETERIZATION WITH CORONARY ANGIOGRAM N/A 10/08/2012   Procedure: LEFT HEART CATHETERIZATION WITH CORONARY ANGIOGRAM;  Surgeon: Lorretta Harp, MD;  Location: [facility] C[facility] LAB;  Service: Cardiovascular;  Laterality: N/A;  . NASAL SINUS SURGERY     ONE SINUS SURGERY THRU NOSE AND ANOTHER SINUS SURGERY THRU INCISION ABOVE RT EYE  . TRANSTHORACIC ECHOCARDIOGRAM  05/21/2018   EF 60-65%, grd I DD, no valvular probs.     Current Meds  Medication Sig  . aspirin EC 81 MG EC tablet Take 1 tablet (81 mg total) by mouth daily.  Marland Kitchen atorvastatin (LIPITOR) 80 MG tablet Take 1 tablet (80 mg  total) by mouth daily at 6 PM.  . cholecalciferol (VITAMIN D) 1000 UNITS tablet Take 2,000 Units by mouth every evening.   . clopidogrel (PLAVIX) 75 MG tablet Take 1 tablet (75 mg total) by mouth daily with breakfast.  . furosemide (LASIX) 40 MG tablet Take 40 mg by mouth daily.   Marland Kitchen gabapentin (NEURONTIN) 300 MG capsule Take 1 capsule (300 mg total) by mouth 2 (two) times daily.  Vanessa Kick Ethyl 1 g CAPS Take 2 capsules (2 g total) by mouth 2 (two) times daily.  Marland Kitchen lisinopril (PRINIVIL,ZESTRIL) 20 MG tablet Take 1 tablet (20 mg total) by mouth daily.  . metFORMIN (GLUCOPHAGE) 500 MG tablet Take 1 tablet (500 mg total) by mouth 2 (two) times daily with a meal.  . metoprolol succinate (TOPROL-XL) 50 MG 24 hr tablet Take 50 mg by mouth every evening.   . nitroGLYCERIN (NITROSTAT) 0.4 MG SL tablet Place 1 tablet (0.4 mg  total) under the tongue every 5 (five) minutes as needed for chest pain (up to 3 doses).  . pantoprazole (PROTONIX) 40 MG tablet Take 40 mg by mouth every evening.   . simvastatin (ZOCOR) 20 MG tablet TAKE 1/2 TABLET (10MG ) BY MOUTH AT BEDTIME     Allergies:   Patient has no known allergies.   Social History   Tobacco Use  . Smoking status: Never Smoker  . Smokeless tobacco: Former Systems developer    Types: Chew  Substance Use Topics  . Alcohol use: No  . Drug use: No     Family Hx: The patient's family history includes Arthritis in his mother; Cancer in his father.  ROS:   Please see the history of present illness.     All other systems reviewed and are negative.   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, LAFB  Recent Labs: 05/19/2018: ALT 7; B Natriuretic Peptide 26.2; TSH 2.823 05/22/2018: BUN 20; Creatinine, Ser 1.33; Hemoglobin 14.1; Platelets 176; Potassium 3.6; Sodium 137   Recent Lipid Panel  Lab Results  Component Value Date/Time   CHOL 185 05/21/2018 04:42 AM   TRIG 327 (H) 05/21/2018 04:42 AM   HDL 40 (L) 05/21/2018 04:42 AM   CHOLHDL 4.6 05/21/2018 04:42 AM   LDLCALC 80 05/21/2018 04:42 AM    Wt Readings from Last 3 Encounters:  08/11/18 255 lb (115.7 kg)  05/31/18 260 lb 12.8 oz (118.3 kg)  05/22/18 254 lb 9.6 oz (115.5 kg)     Objective:    Vital Signs:  BP (!) 148/76   Pulse 68   Ht 5\' 8"  (1.727 m)   Wt 255 lb (115.7 kg)   BMI 38.77 kg/m    VITAL SIGNS:  reviewed GEN:  no acute distress RESPIRATORY:  no shortness of breath PSYCH:  normal affect exam limited by phone format  ASSESSMENT & PLAN:    1. CAD: Rare angina. Not like what he had prior to stent.  Try to see if this improves with better BP control.  COntinue aggressive secondary prevention.  2. Hyperlipidemia: LDL 80 in 05/2018.  COntinue lipid lowering therapy.  3. HTN: BP has been high.  Increase lisinopril to 40 mg daily.  BMet in one week. Check BP over the next few weeks.  4. Type 2 DM: Lost weight since MI.  Trying to eat healthy.   5. Obesity: We spoke about his continued attempts to lose weight.    COVID-19 Education: The signs and symptoms of COVID-19 were discussed with the patient and how to seek care for testing (follow up with PCP or arrange E-visit).  The importance of social distancing was discussed today.  Time:   Today, I have spent  minutes with the patient with telehealth technology discussing the above problems.     Medication Adjustments/Labs and Tests Ordered: Current medicines are reviewed at length with the patient today.  Concerns regarding medicines are outlined above.   Tests Ordered: No orders of the defined types were placed in this encounter.   Medication Changes: No orders of the defined types were placed in this encounter.   Disposition:  Follow up in 1 month(s)  Signed, Larae Grooms, MD  08/11/2018 10:09 AM    [place]

## 2018-08-11 ENCOUNTER — Telehealth (INDEPENDENT_AMBULATORY_CARE_PROVIDER_SITE_OTHER): Payer: HMO | Admitting: Interventional Cardiology

## 2018-08-11 ENCOUNTER — Telehealth: Payer: Self-pay | Admitting: Interventional Cardiology

## 2018-08-11 ENCOUNTER — Encounter: Payer: Self-pay | Admitting: Interventional Cardiology

## 2018-08-11 ENCOUNTER — Other Ambulatory Visit: Payer: Self-pay

## 2018-08-11 VITALS — BP 148/76 | HR 68 | Ht 68.0 in | Wt 255.0 lb

## 2018-08-11 DIAGNOSIS — I2510 Atherosclerotic heart disease of native coronary artery without angina pectoris: Secondary | ICD-10-CM

## 2018-08-11 DIAGNOSIS — I10 Essential (primary) hypertension: Secondary | ICD-10-CM

## 2018-08-11 DIAGNOSIS — E1159 Type 2 diabetes mellitus with other circulatory complications: Secondary | ICD-10-CM

## 2018-08-11 DIAGNOSIS — E782 Mixed hyperlipidemia: Secondary | ICD-10-CM

## 2018-08-11 MED ORDER — LISINOPRIL 40 MG PO TABS: 40.0000 mg | ORAL_TABLET | Freq: Every day | ORAL | 3 refills | Status: DC

## 2018-08-11 NOTE — Telephone Encounter (Signed)
New message:    Patient wife calling concering her husband appt this morning and stated he did not understand about the medication change. Please call patient wife.

## 2018-08-11 NOTE — Patient Instructions (Signed)
Medication Instructions:  Your physician has recommended you make the following change in your medication:   INCREASE: lisinopril to 40 mg once a day  Lab work: Your physician recommends that you return for lab work (BMET) on 08/18/18   If you have labs (blood work) drawn today and your tests are completely normal, you will receive your results only by: Marland Kitchen MyChart Message (if you have MyChart) OR . A paper copy in the mail If you have any lab test that is abnormal or we need to change your treatment, we will call you to review the results.  Testing/Procedures: None ordered  Follow-Up: . Follow up with Dr. Irish Lack via TELEPHONE Visit on 08/22/18 at 3:20 PM  Any Other Special Instructions Will Be Listed Below (If Applicable).  Monitor your Blood Pressure 3-4 times a week and keep a log.

## 2018-08-11 NOTE — Telephone Encounter (Signed)
Lm to call back ./cy 

## 2018-08-14 NOTE — Telephone Encounter (Signed)
Called and spoke to patient's wife. She states that the patient cannot afford the vascepa. She states that it is $80/mo. When reviewing the med list both simvastatin and atorvastatin were on there. Wife states that the patient has been taking both. Patient has labs (BMET) scheduled on 6/12 for recent increase in lisinopril. Discussed with Dr. Irish Lack and patient will stop simvastatin and only take atorvastatin 80 mg QD. Will hold off on Vascepa for now. We will add LIPIDS and LFTS to labs scheduled on 6/12.

## 2018-08-14 NOTE — Telephone Encounter (Signed)
Left message to call back  

## 2018-08-16 ENCOUNTER — Telehealth: Payer: Self-pay | Admitting: *Deleted

## 2018-08-16 NOTE — Telephone Encounter (Signed)
    COVID-19 Pre-Screening Questions:  . In the past 7 to 10 days have you had a cough,  shortness of breath, headache, congestion, fever (100 or greater) body aches, chills, sore throat, or sudden loss of taste or sense of smell? . Have you been around anyone with known Covid 19. . Have you been around anyone who is awaiting Covid 19 test results in the past 7 to 10 days? . Have you been around anyone who has been exposed to Covid 19, or has mentioned symptoms of Covid 19 within the past 7 to 10 days?  If you have any concerns/questions about symptoms patients report during screening (either on the phone or at threshold). Contact the provider seeing the patient or DOD for further guidance.  If neither are available contact a member of the leadership team.           Contacted patient via phone call. No to all Covid 19 questions . Has a mask for  Lab visit.kb 

## 2018-08-18 ENCOUNTER — Other Ambulatory Visit: Payer: HMO | Admitting: *Deleted

## 2018-08-18 ENCOUNTER — Other Ambulatory Visit: Payer: Self-pay

## 2018-08-18 DIAGNOSIS — E782 Mixed hyperlipidemia: Secondary | ICD-10-CM

## 2018-08-18 DIAGNOSIS — E1159 Type 2 diabetes mellitus with other circulatory complications: Secondary | ICD-10-CM

## 2018-08-18 DIAGNOSIS — I10 Essential (primary) hypertension: Secondary | ICD-10-CM

## 2018-08-18 DIAGNOSIS — I2510 Atherosclerotic heart disease of native coronary artery without angina pectoris: Secondary | ICD-10-CM

## 2018-08-18 LAB — BASIC METABOLIC PANEL
BUN/Creatinine Ratio: 15 (ref 10–24)
BUN: 15 mg/dL (ref 8–27)
CO2: 23 mmol/L (ref 20–29)
Calcium: 9.4 mg/dL (ref 8.6–10.2)
Chloride: 103 mmol/L (ref 96–106)
Creatinine, Ser: 1.02 mg/dL (ref 0.76–1.27)
GFR calc Af Amer: 85 mL/min/{1.73_m2} (ref >59)
GFR calc non Af Amer: 74 mL/min/{1.73_m2} (ref >59)
Glucose: 139 mg/dL — ABNORMAL HIGH (ref 65–99)
Potassium: 4.1 mmol/L (ref 3.5–5.2)
Sodium: 141 mmol/L (ref 134–144)

## 2018-08-18 LAB — HEPATIC FUNCTION PANEL
ALT: 13 IU/L (ref 0–44)
AST: 18 IU/L (ref 0–40)
Albumin: 4.2 g/dL (ref 3.7–4.7)
Alkaline Phosphatase: 70 IU/L (ref 39–117)
Bilirubin Total: 0.6 mg/dL (ref 0.0–1.2)
Bilirubin, Direct: 0.15 mg/dL (ref 0.00–0.40)
Total Protein: 6.3 g/dL (ref 6.0–8.5)

## 2018-08-18 LAB — LIPID PANEL
Chol/HDL Ratio: 2.8 ratio (ref 0.0–5.0)
Cholesterol, Total: 110 mg/dL (ref 100–199)
HDL: 39 mg/dL — ABNORMAL LOW (ref >39)
LDL Calculated: 27 mg/dL (ref 0–99)
Triglycerides: 220 mg/dL — ABNORMAL HIGH (ref 0–149)
VLDL Cholesterol Cal: 44 mg/dL — ABNORMAL HIGH (ref 5–40)

## 2018-08-22 ENCOUNTER — Encounter: Payer: Self-pay | Admitting: Interventional Cardiology

## 2018-08-22 ENCOUNTER — Other Ambulatory Visit: Payer: Self-pay

## 2018-08-22 ENCOUNTER — Telehealth (INDEPENDENT_AMBULATORY_CARE_PROVIDER_SITE_OTHER): Payer: HMO | Admitting: Interventional Cardiology

## 2018-08-22 VITALS — BP 145/76 | HR 79 | Ht 68.0 in | Wt 253.0 lb

## 2018-08-22 DIAGNOSIS — I10 Essential (primary) hypertension: Secondary | ICD-10-CM | POA: Diagnosis not present

## 2018-08-22 DIAGNOSIS — I2510 Atherosclerotic heart disease of native coronary artery without angina pectoris: Secondary | ICD-10-CM | POA: Diagnosis not present

## 2018-08-22 DIAGNOSIS — E782 Mixed hyperlipidemia: Secondary | ICD-10-CM | POA: Diagnosis not present

## 2018-08-22 DIAGNOSIS — Z9861 Coronary angioplasty status: Secondary | ICD-10-CM | POA: Diagnosis not present

## 2018-08-22 DIAGNOSIS — E1159 Type 2 diabetes mellitus with other circulatory complications: Secondary | ICD-10-CM

## 2018-08-22 MED ORDER — CLOPIDOGREL BISULFATE 75 MG PO TABS: 75.0000 mg | ORAL_TABLET | Freq: Every day | ORAL | 3 refills | Status: DC

## 2018-08-22 NOTE — Progress Notes (Signed)
Virtual Visit via Video Note   This visit type was conducted due to national recommendations for restrictions regarding the COVID-19 Pandemic (e.g. social distancing) in an effort to limit this patient's exposure and mitigate transmission in our community.  Due to his co-morbid illnesses, this patient is at least at moderate risk for complications without adequate follow up.  This format is felt to be most appropriate for this patient at this time.  All issues noted in this document were discussed and addressed.  A limited physical exam was performed with this format.  Please refer to the patient's chart for his consent to telehealth for Richard [name].   Date:  08/22/2018   ID:  Richard [name], DOB 08/22/1945, MRN [id number]  Patient Location: Home Provider Location: Home  PCP:  Tammi Sou, MD  Cardiologist:  Larae Grooms, MD  Electrophysiologist:  None   Evaluation Performed:  Follow-Up Visit  Chief Complaint:  CAD/HTN  History of Present Illness:    Richard [name] is a 73 y.o. male with who has a history ofprior MI andLADPCI, previously followed by [facility] with CC ofchest pain concerning for unstable angina.  Per records, he had LAD PCI of the LAD in 2013. He had repeat cath in 2014 that showed patent stent.Was onEffient as opposed toBrillanta which caused him a headache. Also with DM.Came into the ED w/ CC of chest pain at rest and with minimal activity as well as DOE. EKG showed no active ischemia. Initial troponin was negative. He was hypertensive in the ED and admitted for CP and BP control.  He was admitted to telemetry and cardiac enzymes were cycled and negative x3 ruling out myocardial infarction.Nitro patch was placed for CP. His home ASA, statin and?blocker blocker were resumed. Metformin held for cath. Echo showed normal LVEF at 60-65%. On 05/22/18, pt underwent cardiac cath. Access obtained via the right  radial artery. Cath showed in-stent restenosis of the previously mid LAD stent, 75% stenosis. This was treated with PCI + DES placement. Post intervention, there was 0% residual stenosis. There was also 20% distal LM stenosis. Medical therapy recommended for residual disease. The RCA and LCx were both free of disease. LV gram showed normal LVEF.  He was placed on dual antiplatelet therapy with aspirin plus Plavix as well as beta-blocker therapy with metoprolol, continuation of home lisinopril and amlodipine. He was continued on statin therapy however he was changed from simvastatin to atorvastatin. LDL was calculated at 85 mg/dL. Triglycerides were elevated at 327. Vescepa was recommended but pt unable to afford.   BP was high in early 2020 and " Increase lisinopril to 40 mg daily." was the plan.   Since the last visit, systolics have been in the 140s range consistently. Spikes to the 190 range have stopped.  Denies : Chest pain. Dizziness. Leg edema. Nitroglycerin use. Orthopnea. Palpitations. Paroxysmal nocturnal dyspnea. Shortness of breath. Syncope.   He feels better. Lesss fatigue with lower BP.  No problems with walking.   The patient does not have symptoms concerning for COVID-19 infection (fever, chills, cough, or new shortness of breath).    Past Medical History:  Diagnosis Date  . Coronary artery disease    a. DES to LAD 04/2011 - took Effient/ASA x 1 yr, now on ASA only. b. Cath 10/2012: patent stent, otherwise normal cors. 05/2018 Prox LAD stent 75% restenosed, DES placed.  Needs DAPT x 1 yr, then possible PLAVIX only.  . Diabetes  mellitus with complication ([place]) 80/9983   A1c 6.9%  . Diabetic peripheral neuropathy ([place])   . GERD (gastroesophageal reflux disease)    HAS HAD ESOPHAGUS STRETCHED SEVERAL TIMES IN THE PAST  . Hyperlipidemia    goal LDL < 70  . Hypertension   . Myocardial infarction ([place])   . OSA on CPAP 10/18/2011   doesn't wear it  . Right knee pain    TORN  RIGHT KNEE MEDIAL MENSICAL TEAR  . Shortness of breath    WITH EXERTION  . Syncope    a. 10/2012.   Past Surgical History:  Procedure Laterality Date  . CARDIAC CATHETERIZATION  06/2017   In-stent restenosis cleared out  . CARDIOVASCULAR STRESS TEST     Latter part of 2019--normal per cardiologist's note from 10/2017  . CERVICAL FUSION  1990 and 1993   X 2   SURGERIES    SLIGHT LIMITATION ROM  . COLONOSCOPY  11/24/2004; 2018   NORMAL.  Recall 2016.  Repeat 2018 normal per pt.  . CORONARY ANGIOPLASTY WITH STENT PLACEMENT    . CORONARY STENT INTERVENTION N/A 05/22/2018   Prox LAD for in stent restenosis.  DAPT x 1 yr.  Procedure: CORONARY STENT INTERVENTION;  Surgeon: Jettie Booze, MD;  Location: [facility];  Service: Cardiovascular;  Laterality: N/A;  . INTRAVASCULAR ULTRASOUND/IVUS N/A 05/22/2018   Procedure: Intravascular Ultrasound/IVUS;  Surgeon: Jettie Booze, MD;  Location: [facility];  Service: Cardiovascular;  Laterality: N/A;  . KNEE ARTHROSCOPY  10/22/2011   Procedure: ARTHROSCOPY KNEE;  Surgeon: Gearlean Alf, MD;  Location: [facility];  Service: Orthopedics;  Laterality: Right;  Right knee scope with debridement  . LAPAROSCOPIC CHOLECYSTECTOMY  2018  . LEFT HEART CATH AND CORONARY ANGIOGRAPHY N/A 05/22/2018   DES placed for in stent restenosis.  EF 55-60%.  Procedure: LEFT HEART CATH AND CORONARY ANGIOGRAPHY;  Surgeon: Jettie Booze, MD;  Location: [place] [facility];  Service: Cardiovascular;  Laterality: N/A;  . LEFT HEART CATHETERIZATION WITH CORONARY ANGIOGRAM N/A 10/08/2012   Procedure: LEFT HEART CATHETERIZATION WITH CORONARY ANGIOGRAM;  Surgeon: Lorretta Harp, MD;  Location: [name];  Service: Cardiovascular;  Laterality: N/A;  . NASAL SINUS SURGERY     ONE SINUS SURGERY THRU NOSE AND ANOTHER SINUS SURGERY THRU INCISION ABOVE RT EYE  . TRANSTHORACIC ECHOCARDIOGRAM  05/21/2018   EF 60-65%, grd I DD, no valvular probs.     No  outpatient medications have been marked as taking for the 08/22/18 encounter (Appointment) with Jettie Booze, MD.     Allergies:   Patient has no known allergies.   Social History   Tobacco Use  . Smoking status: Never Smoker  . Smokeless tobacco: Former Systems developer    Types: Chew  Substance Use Topics  . Alcohol use: No  . Drug use: No     Family Hx: The patient's family history includes Arthritis in his mother; Cancer in his father.  ROS:   Please see the history of present illness.    BP improved All other systems reviewed and are negative.   Prior CV studies:   The following studies were reviewed today:  Cath results as noted above  Labs/Other Tests and Data Reviewed:    EKG:  An ECG dated 05/2018 was personally reviewed today and demonstrated:  NSR, nonspecific ST changes  Recent Labs: 05/19/2018: B Natriuretic Peptide 26.2; TSH 2.823 05/22/2018: Hemoglobin 14.1; Platelets 176 08/18/2018: ALT 13; BUN 15; Creatinine,  Ser 1.02; Potassium 4.1; Sodium 141   Recent Lipid Panel Lab Results  Component Value Date/Time   CHOL 110 08/18/2018 10:10 AM   TRIG 220 (H) 08/18/2018 10:10 AM   HDL 39 (L) 08/18/2018 10:10 AM   CHOLHDL 2.8 08/18/2018 10:10 AM   CHOLHDL 4.6 05/21/2018 04:42 AM   LDLCALC 27 08/18/2018 10:10 AM    Wt Readings from Last 3 Encounters:  08/11/18 255 lb (115.7 kg)  05/31/18 260 lb 12.8 oz (118.3 kg)  05/22/18 254 lb 9.6 oz (115.5 kg)     Objective:    Vital Signs:  There were no vitals taken for this visit.   VITAL SIGNS:  reviewed GEN:  no acute distress RESPIRATORY:  no shortness of breath PSYCH:  normal affect exam limited by phone format  ASSESSMENT & PLAN:    1. CAD: No angina. Continue aggressive medical therapy.  2. Hyperlipidemia: TG improved.  Vascepa was expensive.  Check with lipid clinic. 3. HTN: BP improved. COntinue current meds.  Increase activity. 4. Type 2 DM: Increased TG May be related to blood sugar.   COVID-19  Education: The signs and symptoms of COVID-19 were discussed with the patient and how to seek care for testing (follow up with PCP or arrange E-visit).  The importance of social distancing was discussed today.  Time:   Today, I have spent 15 minutes with the patient with telehealth technology discussing the above problems.     Medication Adjustments/Labs and Tests Ordered: Current medicines are reviewed at length with the patient today.  Concerns regarding medicines are outlined above.   Tests Ordered: No orders of the defined types were placed in this encounter.   Medication Changes: No orders of the defined types were placed in this encounter.   Follow Up:  Virtual Visit or In Person in 6 month(s)  Signed, Larae Grooms, MD  08/22/2018 1:22 PM    [place]

## 2018-08-22 NOTE — Patient Instructions (Signed)
Medication Instructions:  Your physician recommends that you continue on your current medications as directed. Please refer to the Current Medication list given to you today.  If you need a refill on your cardiac medications before your next appointment, please call your pharmacy.   Lab work: None Ordered  If you have labs (blood work) drawn today and your tests are completely normal, you will receive your results only by: Marland Kitchen MyChart Message (if you have MyChart) OR . A paper copy in the mail If you have any lab test that is abnormal or we need to change your treatment, we will call you to review the results.  Testing/Procedures: None ordered  Follow-Up: At [facility], you and your health needs are our priority.  As part of our continuing mission to provide you with exceptional heart care, we have created designated Provider Care Teams.  These Care Teams include your primary Cardiologist (physician) and Advanced Practice Providers (APPs -  Physician Assistants and Nurse Practitioners) who all work together to provide you with the care you need, when you need it. . You will need a follow up appointment in 6 months. Please call our office 2 months in advance to schedule this appointment.  You may see Casandra Doffing, MD or one of the following Advanced Practice Providers on your designated Care Team:   . Lyda Jester, PA-C . Dayna Dunn, PA-C . Ermalinda Barrios, PA-C  Any Other Special Instructions Will Be Listed Below (If Applicable).  Continue to monitor Blood Pressure at home and let us know if it consistently greater than 140/90

## 2018-08-23 NOTE — Progress Notes (Signed)
I Agree with Megan. OK to start fenofibrate

## 2018-08-24 MED ORDER — FENOFIBRATE 145 MG PO TABS: 145.0000 mg | ORAL_TABLET | Freq: Every day | ORAL | 3 refills | Status: DC

## 2018-08-24 NOTE — Progress Notes (Signed)
Called and made patient aware of recommendations to start fenofibrate 145 mg QD. Patient verbalized understanding and thanked me for the call. Rx sent to preferred pharmacy.

## 2018-08-24 NOTE — Addendum Note (Signed)
Addended by: Drue Novel I on: 08/24/2018 10:55 AM   Modules accepted: Orders

## 2018-08-29 ENCOUNTER — Telehealth: Payer: Self-pay

## 2018-08-29 MED ORDER — AMOXICILLIN-POT CLAVULANATE 875-125 MG PO TABS: 1.0000 | ORAL_TABLET | Freq: Two times a day (BID) | ORAL | 0 refills | Status: DC

## 2018-08-29 NOTE — Telephone Encounter (Signed)
Augmentin erx'd

## 2018-08-29 NOTE — Telephone Encounter (Signed)
Detailed message left on phone for patient to pick up rx.

## 2018-08-29 NOTE — Addendum Note (Signed)
Addended by: Tammi Sou on: 08/29/2018 12:06 PM   Modules accepted: Orders

## 2018-08-29 NOTE — Telephone Encounter (Signed)
Please advise, thanks.   Copied from [place] [phone]. Topic: General - Other >> Aug 28, 2018  3:20 PM Carolyn Stare wrote: Pt has a tooth ache and can not get in to see a dentist until 09/07/2018 and is asking for antibiotic

## 2018-09-26 ENCOUNTER — Encounter: Payer: Self-pay | Admitting: Family Medicine

## 2018-11-05 ENCOUNTER — Other Ambulatory Visit: Payer: Self-pay | Admitting: Family Medicine

## 2018-11-19 ENCOUNTER — Other Ambulatory Visit: Payer: Self-pay | Admitting: Cardiology

## 2019-01-02 ENCOUNTER — Other Ambulatory Visit: Payer: Self-pay

## 2019-01-02 NOTE — Patient Outreach (Signed)
  [facility]) Care Management Chronic Special Needs Program  01/02/2019  Name: Richard [name] DOB: Nov 17, 1945  MRN: [id number]  [name] is enrolled in a chronic special needs plan for Diabetes. Client called with no answer No answer and HIPAA compliant message left. 1st attempt Plan for 2nd outreach call in one week Chronic care management coordinator will attempt outreach in one week.   Peter Garter RN, Jackquline Denmark, CDE Chronic Care Management Coordinator [facility] [phone]

## 2019-01-10 ENCOUNTER — Other Ambulatory Visit: Payer: Self-pay

## 2019-01-10 NOTE — Patient Outreach (Signed)
  [facility]) Care Management Chronic Special Needs Program  01/10/2019  Name: BERNABE KINYON DOB: [birth year]  MRN: [id number]  Mr. Shorty Schoene is enrolled in a chronic special needs plan for Diabetes. Client called with no answer No answer and HIPAA compliant message left. 2nd attempt Plan for 3rd outreach call in one week Chronic care management coordinator will attempt outreach in one week.   Peter Garter RN, Jackquline Denmark, CDE Chronic Care Management Coordinator [place] [facility] [phone]

## 2019-01-17 ENCOUNTER — Other Ambulatory Visit: Payer: Self-pay

## 2019-01-17 NOTE — Patient Outreach (Signed)
  [facility]) Care Management Chronic Special Needs Program  01/17/2019  Name: Richard [name] DOB: [birth year]  MRN: [id number]  Richard [name] is enrolled in a chronic special needs plan for Diabetes. Client called with no answer and HIPAA compliant message left.  3  attempt Client has not responded to 3 outreach attempts by [facility] to update individualized care plan  The client's individualized care plan was developed based on available data and Health Risk Assessment   Goals Addressed            This Visit's Progress   .  Acknowledge receipt of Advanced Directive package   On track   . Client understands the importance of follow-up with providers by attending scheduled visits   On track   . COMPLETED: Client will report abillity to obtain Medications within 3 months       [employer] scientist (medical) assisted    . Client will report no worsening of symptoms related to heart disease within the next 9 months    On track   . Client will use Assistive Devices as needed and verbalize understanding of device use   On track   . Client will verbalize knowledge of self management of Hypertension as evidences by BP reading of 130/80 or less; or as defined by provider   On track   . Decrease inpatient admissions/ readmissions with in the next year   On track   . Decrease inpatient diabetes admissions/readmissions with in the year   On track   . Decrease the use of hospital emergency department related to diabetes within the next year    On track   . HEMOGLOBIN A1C < 7.0        Diabetes self management actions:  Glucose monitoring per provider recommendations  Eat Healthy  Check feet daily  Visit provider every 3-6 months as directed  Hbg A1C level every 3-6 months.  Eye Exam yearly    . Maintain timely refills of diabetic medication as prescribed within the year .   On track   . COMPLETED: Obtain annual  Lipid Profile, LDL-C       Completed 08/18/18    .  Obtain Annual Eye (retinal)  Exam    On track   . Obtain Annual Foot Exam   On track   . Obtain annual screen for micro albuminuria (urine) , nephropathy (kidney problems)   On track   . Obtain Hemoglobin A1C at least 2 times per year   On track   . Visit Primary Care Provider or Endocrinologist at least 2 times per year    On track    Client is  meeting diabetes self management goal of hemoglobin A1C of <7% with last reading 6.9% Client has not responded to 3 outreach attempts by [facility] to update individualized care plan  The client's individualized care plan was developed based on available data and Health Risk Assessment   Plan:  Send unsuccessful outreach letter with a copy of their individualized care plan and Send individual care plan to provider  Chronic care management coordinator will outreach in:  4-6 Months     Peter Garter RN, [facility], [place] Management Coordinator [place] [facility] [phone]

## 2019-01-22 ENCOUNTER — Other Ambulatory Visit: Payer: Self-pay | Admitting: Family Medicine

## 2019-03-09 DIAGNOSIS — R310 Gross hematuria: Secondary | ICD-10-CM

## 2019-03-09 HISTORY — DX: Gross hematuria: R31.0

## 2019-05-09 ENCOUNTER — Other Ambulatory Visit: Payer: Self-pay

## 2019-05-09 NOTE — Patient Outreach (Signed)
  [facility]) Care Management Chronic Special Needs Program  05/09/2019  Name: Richard [name] DOB: 08/03/1945  MRN: [id number]  Richard [name] is enrolled in a chronic special needs plan for Diabetes. Client called with no answer No answer and HIPAA compliant message left. 1st attempt Plan for 2nd outreach call in one week Chronic care management coordinator will attempt outreach in one week.   Peter Garter RN, Jackquline Denmark, CDE Chronic Care Management Coordinator [place] [facility] [phone]

## 2019-05-16 ENCOUNTER — Other Ambulatory Visit: Payer: Self-pay

## 2019-05-16 NOTE — Patient Outreach (Signed)
  [facility]) Care Management Chronic Special Needs Program  05/16/2019  Name: NEITHAN GEHR DOB: 1946-02-24  MRN: [id number]  Mr. Palash Douse is enrolled in a chronic special needs plan for Diabetes. Client called with no answer No answer and HIPAA compliant message left. 2nd attempt Plan for 3rd outreach call in one week Chronic care management coordinator will attempt outreach in one week.   Peter Garter RN, Jackquline Denmark, CDE Chronic Care Management Coordinator [place] [facility] [phone]

## 2019-05-23 ENCOUNTER — Other Ambulatory Visit: Payer: Self-pay

## 2019-05-23 NOTE — Patient Outreach (Signed)
[facility] [facility]) Care Management Chronic Special Needs Program  05/23/2019  Name: Richard [name] DOB: Dec 16, 1945  MRN: [id number]  [name] is enrolled in a chronic special needs plan for  Diabetes.  Client called with no answer and HIPAA compliant message left.  3rd attempt A completed health risk assessment has not been received from the client and client has not responded to 3 outreach attempts by [facility] to complete 2021 Health Risk Assessment  The client's individualized care plan was developed based on available data and previous 2020 Health Risk Assessment Goals Addressed            This Visit's Progress   . Client understands the importance of follow-up with providers by attending scheduled visits   Not on track    No provider visit since 08/22/18 Plan to schedule appointments with providers    . Client will report no worsening of symptoms related to heart disease within the next 9 months    On track    No ED or hospital visit for heart disease since 05/19/18 admission Reviewed signs and symptoms to call doctor and when to call 911 Follow a low salt diet  Sent EMMI Heart Disease in diabetics    . Client will use Assistive Devices as needed and verbalize understanding of device use   Not on track    [employer] approved meters-One Touch, Freestyle or Precision     . Client will verbalize knowledge of self management of Hypertension as evidences by BP reading of 130/80 or less; or as defined by provider   No change    Plan to check B/P regularly Take B/P medications as ordered Plan to follow a low salt diet  Increase activity as tolerated Sent EMMI-High Blood Pressure(Hypertension): What you can do     . Decrease inpatient admissions/ readmissions with in the next year   On track    Call [employer]) Nurse Advise Line [phone] for after hour, weekend or holiday if needed    . COMPLETED: Decrease inpatient diabetes  admissions/readmissions with in the year   Not on track    One admission in 2020 Goal closed 05/23/19    . COMPLETED: Decrease the use of hospital emergency department related to diabetes within the next year        No ED visits related to diabetes in 2020 Goal completed 05/23/19    . HEMOGLOBIN A1C < 7.0        Last Hemoglobin A1C 6.9% on 05/21/18 Plan to check blood sugars as directed with goals fasting or 1 1/2 hours after eating with goal of 80-130 fasting and 180 or less after meals Plan to follow a low carbohydrate, low salt diet, watch portion sizes and avoid sugar sweetened drinks    . Maintain timely refills of diabetic medication as prescribed within the year .   On track    It is important to get your medications refilled on time    . Obtain annual  Lipid Profile, LDL-C   On track    Last completed 08/18/18 LDL 27 The goal for LDL is less than 70 mg/dL as you are at high risk for complications Try to avoid saturated fats, trans-fats and eat more fiber Plan to take statin as ordered    . Obtain Annual Eye (retinal)  Exam    Not on track    Plan to have a dilated eye exam every year    . Obtain  Annual Foot Exam   Not on track    Check your skin and feet every day for cuts, bruises, redness, blisters, or sores. Report to provider any problems with your feet Schedule a foot exam with your health care provider once every year    . Obtain annual screen for micro albuminuria (urine) , nephropathy (kidney problems)   Not on track    It is important for your doctor to check your urine for protein at least every year    . Obtain Hemoglobin A1C at least 2 times per year   Not on track    Last completed 05/18/18 It is important to have your Hemoglobin A1C checked every 6 months if you are at goal and every 3 months if you are not at goal    . Visit Primary Care Provider or Endocrinologist at least 2 times per year    Not on track    Primary care provider 05/19/18 Cardiology  08/18/18 Please schedule your annual wellness visit        Plan:  . Send unsuccessful outreach letter with a copy of individualized care plan to client . Send individualized care plan to provider . Educational Materials EMMI-High Blood Pressure(Hypertension): What you can do, Heart disease in diabetes  Chronic care management coordinator will attempt outreach in 3 months.  Peter Garter RN, Jackquline Denmark, CDE Chronic Care Management Coordinator [place] [facility] [phone]

## 2019-06-02 ENCOUNTER — Other Ambulatory Visit: Payer: Self-pay | Admitting: Cardiology

## 2019-07-27 ENCOUNTER — Other Ambulatory Visit: Payer: Self-pay | Admitting: Family Medicine

## 2019-08-07 ENCOUNTER — Other Ambulatory Visit: Payer: Self-pay | Admitting: Interventional Cardiology

## 2019-08-08 ENCOUNTER — Other Ambulatory Visit: Payer: Self-pay

## 2019-08-08 MED ORDER — GABAPENTIN 300 MG PO CAPS: 300.0000 mg | ORAL_CAPSULE | Freq: Two times a day (BID) | ORAL | 0 refills | Status: DC

## 2019-08-08 NOTE — Telephone Encounter (Signed)
I have not seen him in 15 months. I'll do #30 tabs.  Needs office f/u prior to any further refills.

## 2019-08-08 NOTE — Telephone Encounter (Signed)
RF request for Gabapentin LOV:05/19/18 Next ov: n/a Last written: 05/19/18 (180,3)  Medication pending, please advise if appropriate.

## 2019-08-09 NOTE — Telephone Encounter (Signed)
LM for pt to CB to schedule next f/u appt.

## 2019-08-15 NOTE — Telephone Encounter (Signed)
Patient has been scheduled for 6/18.

## 2019-08-23 ENCOUNTER — Other Ambulatory Visit: Payer: Self-pay

## 2019-08-23 NOTE — Patient Outreach (Signed)
  [place] [facility]) Care Management Chronic Special Needs Program  08/23/2019  Name: Richard [name] DOB: [birth year]  MRN: [id number]  Mr. [name] is enrolled in a chronic special needs plan for Diabetes. Client called with no answer No answer and HIPAA compliant message left. Plan for 2nd outreach call in 1-2 weeks Chronic care management coordinator will attempt outreach in 1-2 weeks.   Peter Garter RN, Jackquline Denmark, CDE Chronic Care Management Coordinator [place] [facility] [phone]

## 2019-08-24 ENCOUNTER — Telehealth: Payer: Self-pay | Admitting: Interventional Cardiology

## 2019-08-24 ENCOUNTER — Ambulatory Visit (INDEPENDENT_AMBULATORY_CARE_PROVIDER_SITE_OTHER): Payer: HMO | Admitting: Family Medicine

## 2019-08-24 ENCOUNTER — Other Ambulatory Visit: Payer: Self-pay

## 2019-08-24 ENCOUNTER — Encounter: Payer: Self-pay | Admitting: Cardiovascular Disease

## 2019-08-24 ENCOUNTER — Encounter: Payer: Self-pay | Admitting: Family Medicine

## 2019-08-24 ENCOUNTER — Ambulatory Visit (INDEPENDENT_AMBULATORY_CARE_PROVIDER_SITE_OTHER): Payer: HMO | Admitting: Cardiovascular Disease

## 2019-08-24 ENCOUNTER — Telehealth: Payer: Self-pay

## 2019-08-24 ENCOUNTER — Other Ambulatory Visit: Payer: Self-pay | Admitting: Cardiovascular Disease

## 2019-08-24 VITALS — BP 164/85 | HR 70 | Temp 98.0°F | Resp 16 | Ht 68.0 in | Wt 260.8 lb

## 2019-08-24 VITALS — BP 138/82 | HR 78 | Ht 68.0 in | Wt 259.8 lb

## 2019-08-24 DIAGNOSIS — I10 Essential (primary) hypertension: Secondary | ICD-10-CM

## 2019-08-24 DIAGNOSIS — I25119 Atherosclerotic heart disease of native coronary artery with unspecified angina pectoris: Secondary | ICD-10-CM | POA: Diagnosis not present

## 2019-08-24 DIAGNOSIS — E7800 Pure hypercholesterolemia, unspecified: Secondary | ICD-10-CM

## 2019-08-24 DIAGNOSIS — I25118 Atherosclerotic heart disease of native coronary artery with other forms of angina pectoris: Secondary | ICD-10-CM | POA: Diagnosis not present

## 2019-08-24 DIAGNOSIS — E1159 Type 2 diabetes mellitus with other circulatory complications: Secondary | ICD-10-CM | POA: Diagnosis not present

## 2019-08-24 DIAGNOSIS — I2510 Atherosclerotic heart disease of native coronary artery without angina pectoris: Secondary | ICD-10-CM | POA: Diagnosis not present

## 2019-08-24 DIAGNOSIS — R079 Chest pain, unspecified: Secondary | ICD-10-CM | POA: Diagnosis not present

## 2019-08-24 DIAGNOSIS — E118 Type 2 diabetes mellitus with unspecified complications: Secondary | ICD-10-CM | POA: Diagnosis not present

## 2019-08-24 DIAGNOSIS — E782 Mixed hyperlipidemia: Secondary | ICD-10-CM

## 2019-08-24 LAB — CBC WITH DIFFERENTIAL/PLATELET
Basophils Absolute: 0 10*3/uL (ref 0.0–0.1)
Basophils Relative: 0.5 % (ref 0.0–3.0)
Eosinophils Absolute: 0.2 10*3/uL (ref 0.0–0.7)
Eosinophils Relative: 3 % (ref 0.0–5.0)
HCT: 42.8 % (ref 39.0–52.0)
Hemoglobin: 14.4 g/dL (ref 13.0–17.0)
Lymphocytes Relative: 24.1 % (ref 12.0–46.0)
Lymphs Abs: 1.8 10*3/uL (ref 0.7–4.0)
MCHC: 33.8 g/dL (ref 30.0–36.0)
MCV: 96.7 fl (ref 78.0–100.0)
Monocytes Absolute: 0.5 10*3/uL (ref 0.1–1.0)
Monocytes Relative: 7.4 % (ref 3.0–12.0)
Neutro Abs: 4.8 10*3/uL (ref 1.4–7.7)
Neutrophils Relative %: 65 % (ref 43.0–77.0)
Platelets: 165 10*3/uL (ref 150.0–400.0)
RBC: 4.42 Mil/uL (ref 4.22–5.81)
RDW: 13.6 % (ref 11.5–15.5)
WBC: 7.3 10*3/uL (ref 4.0–10.5)

## 2019-08-24 LAB — COMPREHENSIVE METABOLIC PANEL
ALT: 15 U/L (ref 0–53)
AST: 15 U/L (ref 0–37)
Albumin: 4.2 g/dL (ref 3.5–5.2)
Alkaline Phosphatase: 60 U/L (ref 39–117)
BUN: 9 mg/dL (ref 6–23)
CO2: 30 mEq/L (ref 19–32)
Calcium: 9.6 mg/dL (ref 8.4–10.5)
Chloride: 106 mEq/L (ref 96–112)
Creatinine, Ser: 0.88 mg/dL (ref 0.40–1.50)
GFR: 84.99 mL/min (ref >60.00)
Glucose, Bld: 129 mg/dL — ABNORMAL HIGH (ref 70–99)
Potassium: 4.6 mEq/L (ref 3.5–5.1)
Sodium: 141 mEq/L (ref 135–145)
Total Bilirubin: 0.5 mg/dL (ref 0.2–1.2)
Total Protein: 6.3 g/dL (ref 6.0–8.3)

## 2019-08-24 LAB — LIPID PANEL
Cholesterol: 194 mg/dL (ref 0–200)
HDL: 45.3 mg/dL (ref >39.00)
NonHDL: 149.18
Total CHOL/HDL Ratio: 4
Triglycerides: 341 mg/dL — ABNORMAL HIGH (ref 0.0–149.0)
VLDL: 68.2 mg/dL — ABNORMAL HIGH (ref 0.0–40.0)

## 2019-08-24 LAB — MICROALBUMIN / CREATININE URINE RATIO
Creatinine,U: 87.3 mg/dL
Microalb Creat Ratio: 1.5 mg/g (ref 0.0–30.0)
Microalb, Ur: 1.4 mg/dL (ref 0.0–1.9)

## 2019-08-24 LAB — TSH: TSH: 1.54 u[IU]/mL (ref 0.35–4.50)

## 2019-08-24 LAB — LDL CHOLESTEROL, DIRECT: Direct LDL: 100 mg/dL

## 2019-08-24 LAB — HEMOGLOBIN A1C: Hgb A1c MFr Bld: 7.2 % — ABNORMAL HIGH (ref 4.6–6.5)

## 2019-08-24 MED ORDER — METOPROLOL SUCCINATE ER 100 MG PO TB24: 100.0000 mg | ORAL_TABLET | Freq: Every day | ORAL | 3 refills | Status: DC

## 2019-08-24 MED ORDER — ISOSORBIDE MONONITRATE ER 30 MG PO TB24: 15.0000 mg | ORAL_TABLET | Freq: Every day | ORAL | 3 refills | Status: DC

## 2019-08-24 NOTE — Progress Notes (Signed)
Cardiology Office Note:    Date:  08/24/2019   ID:  Richard [name], DOB 11/08/1945, MRN [id number]  PCP:  Tammi Sou, MD  [facility] Cardiologist:  Larae Grooms, MD  [facility] Electrophysiologist:  None   Referring MD: Tammi Sou, MD   Chief Complaint  Patient presents with  . Chest Pain    History of Present Illness:    Richard [name] is a 74 y.o. male with a hx of coronary artery disease, added on today because of complaints of chest pain.  The patient has been followed by Dr. Irish Lack, last seen August 22, 2018 and a telemedicine visit.  He has a history of PCI to the LAD in 2013.  Repeat heart catheterization in 2014 demonstrated stent patency.  He again presented with symptoms of unstable angina in March 2020 and underwent cardiac catheterization demonstrating severe in-stent restenosis in the previously implanted LAD stent.  He was treated with another drug-eluting stent with good result.  He was noted to have minor nonobstructive distal left main stenosis of 20% and patency of the RCA and left circumflex.  LVEF has been normal.  He has been continued on dual antiplatelet therapy with aspirin and clopidogrel.  He previously had headache with ticagrelor and at 1 point has been treated with prasugrel.  The patient was seen by his PCP, Dr Anitra Lauth, this morning. He was noted to have some changes on his EKG and complained of progressive dyspnea and intermittent chest pain. He underwent PCI in March 2020 and felt really good after that. He has developed progressive exertional dyspnea over the past few months. He is fatigued and also complains of intermittent chest discomfort. Describes this as a 'pressure' or a 'weight' on the chest, sometimes radiating to the left arm and sometimes through to the back. When he gardens, he has to sit and rest frequently.  The patient has also had some discomfort at rest.  Past Medical History:  Diagnosis Date  . Coronary artery  disease    a. DES to LAD 04/2011 - took Effient/ASA x 1 yr, now on ASA only. b. Cath 10/2012: patent stent, otherwise normal cors. 05/2018 Prox LAD stent 75% restenosed, DES placed.  Needs DAPT x 1 yr, then possible PLAVIX only.  . Diabetes mellitus with complication ([place]) 24/0973   A1c 6.9%  . Diabetic peripheral neuropathy ([place])   . GERD (gastroesophageal reflux disease)    HAS HAD ESOPHAGUS STRETCHED SEVERAL TIMES IN THE PAST  . Hyperlipidemia    goal LDL < 70  . Hypertension   . Myocardial infarction ([place])   . OSA on CPAP 10/18/2011   doesn't wear it  . Right knee pain    TORN RIGHT KNEE MEDIAL MENSICAL TEAR  . Shortness of breath    WITH EXERTION  . Syncope    a. 10/2012.    Past Surgical History:  Procedure Laterality Date  . CARDIAC CATHETERIZATION  06/2017   In-stent restenosis cleared out  . CARDIOVASCULAR STRESS TEST     Latter part of 2019--normal per cardiologist's note from 10/2017  . CERVICAL FUSION  1990 and 1993   X 2   SURGERIES    SLIGHT LIMITATION ROM  . COLONOSCOPY  11/24/2004; 2018   NORMAL.  Recall 2016.  Repeat 2018 normal per pt.  . CORONARY ANGIOPLASTY WITH STENT PLACEMENT    . CORONARY STENT INTERVENTION N/A 05/22/2018   Prox LAD for in stent restenosis.  DAPT x 1 yr.  Procedure: CORONARY STENT INTERVENTION;  Surgeon: Jettie Booze, MD;  Location: [place] [facility];  Service: Cardiovascular;  Laterality: N/A;  . INTRAVASCULAR ULTRASOUND/IVUS N/A 05/22/2018   Procedure: Intravascular Ultrasound/IVUS;  Surgeon: Jettie Booze, MD;  Location: [place] [facility];  Service: Cardiovascular;  Laterality: N/A;  . KNEE ARTHROSCOPY  10/22/2011   Procedure: ARTHROSCOPY KNEE;  Surgeon: Gearlean Alf, MD;  Location: [facility];  Service: Orthopedics;  Laterality: Right;  Right knee scope with debridement  . LAPAROSCOPIC CHOLECYSTECTOMY  2018  . LEFT HEART CATH AND CORONARY ANGIOGRAPHY N/A 05/22/2018   DES placed for in stent restenosis.  EF 55-60%.  Procedure:  LEFT HEART CATH AND CORONARY ANGIOGRAPHY;  Surgeon: Jettie Booze, MD;  Location: [place] [facility];  Service: Cardiovascular;  Laterality: N/A;  . LEFT HEART CATHETERIZATION WITH CORONARY ANGIOGRAM N/A 10/08/2012   Procedure: LEFT HEART CATHETERIZATION WITH CORONARY ANGIOGRAM;  Surgeon: Lorretta Harp, MD;  Location: [facility];  Service: Cardiovascular;  Laterality: N/A;  . NASAL SINUS SURGERY     ONE SINUS SURGERY THRU NOSE AND ANOTHER SINUS SURGERY THRU INCISION ABOVE RT EYE  . TRANSTHORACIC ECHOCARDIOGRAM  05/21/2018   EF 60-65%, grd I DD, no valvular probs.    Current Medications: Current Meds  Medication Sig  . aspirin EC 81 MG EC tablet Take 1 tablet (81 mg total) by mouth daily.  Marland Kitchen atorvastatin (LIPITOR) 80 MG tablet TAKE 1 TABLET (80 MG TOTAL) BY MOUTH DAILY AT 6 PM.  . cholecalciferol (VITAMIN D) 1000 UNITS tablet Take 2,000 Units by mouth every evening.   . clopidogrel (PLAVIX) 75 MG tablet Take 1 tablet (75 mg total) by mouth daily with breakfast.  . fenofibrate (TRICOR) 145 MG tablet Take 1 tablet (145 mg total) by mouth daily.  . furosemide (LASIX) 40 MG tablet Take 40 mg by mouth daily.   Marland Kitchen gabapentin (NEURONTIN) 300 MG capsule Take 1 capsule (300 mg total) by mouth 2 (two) times daily.  Marland Kitchen lisinopril (ZESTRIL) 40 MG tablet Take 1 tablet (40 mg total) by mouth daily. Please call and schedule overdue follow up appt for further refills [phone]  . metFORMIN (GLUCOPHAGE) 500 MG tablet Take 500 mg by mouth daily with breakfast.  . metoprolol succinate (TOPROL-XL) 100 MG 24 hr tablet Take 1 tablet (100 mg total) by mouth daily. Take with or immediately following a meal.  . pantoprazole (PROTONIX) 40 MG tablet Take 40 mg by mouth every evening.   . [DISCONTINUED] nitroGLYCERIN (NITROSTAT) 0.4 MG SL tablet Place 1 tablet (0.4 mg total) under the tongue every 5 (five) minutes as needed for chest pain (up to 3 doses).     Allergies:   Patient has no known allergies.    Social History   Socioeconomic History  . Marital status: Married    Spouse name: [name]  . Number of children: Not on file  . Years of education: Not on file  . Highest education level: Not on file  Occupational History  . Not on file  Tobacco Use  . Smoking status: Never Smoker  . Smokeless tobacco: Former Systems developer    Types: Secondary school teacher  . Vaping Use: Never used  Substance and Sexual Activity  . Alcohol use: No  . Drug use: No  . Sexual activity: Not on file  Other Topics Concern  . Not on file  Social History Narrative   Married, 2 daughters.  2 other daughters deceased.   Educ: HS  Occup: farmer, retired from [employer], Theme park manager at [employer].   No T/A/Ds   Social Determinants of Radio broadcast assistant Strain:   . Difficulty of Paying Living Expenses:   Food Insecurity:   . Worried About Charity fundraiser in the Last Year:   . Arboriculturist in the Last Year:   Transportation Needs:   . Film/video editor (Medical):   Marland Kitchen Lack of Transportation (Non-Medical):   Physical Activity:   . Days of Exercise per Week:   . Minutes of Exercise per Session:   Stress:   . Feeling of Stress :   Social Connections:   . Frequency of Communication with Friends and Family:   . Frequency of Social Gatherings with Friends and Family:   . Attends Religious Services:   . Active Member of Clubs or Organizations:   . Attends Archivist Meetings:   Marland Kitchen Marital Status:      Family History: The patient's family history includes Arthritis in his mother; Cancer in his father.  ROS:   Please see the history of present illness.    All other systems reviewed and are negative.  EKGs/Labs/Other Studies Reviewed:    The following studies were reviewed today: Cardiac Cath 05-22-2018: Conclusion    The left ventricular systolic function is normal.  LV end diastolic pressure is normal.  The left ventricular ejection fraction is 55-65% by visual  estimate.  There is no aortic valve stenosis.  Dist LM lesion is 20% stenosed.  Prox LAD lesion is 75% stenosed. THis was instent restenosis.  A drug-eluting stent was successfully placed using a STENT SYNERGY DES 4X38, postdilated to 4.5 mm  Post intervention, there is a 0% residual stenosis. Post procedure IVUS showed well apposed stent.   DAPT for 12 months.  Consider clopidogrel monotherapy after that along with aggressive secondary prevention.    Possible discharge this evening.   Coronary Findings  Diagnostic Dominance: Right Left Main  Dist LM lesion is 20% stenosed.  Left Anterior Descending  Prox LAD lesion is 75% stenosed. The lesion was previously treated using a stent (unknown type) over 2 years ago.  Mid LAD lesion is 25% stenosed.  Intervention  Prox LAD lesion  Stent  CATHETER LAUNCHER 6FREBU 3.75 guide catheter was inserted. Lesion crossed with guidewire using a WIRE ASAHI PROWATER 180CM. Pre-stent angioplasty was performed using a BALLOON WOLVERINE 3.00X10. A drug-eluting stent was successfully placed using a STENT SYNERGY DES 4X38. Stent strut is well apposed. Post-stent angioplasty was performed using a BALLOON [place] SAPPHIRE 4.5X15. IVUS performed shows the true vessel diameter is about 5 mm. The stent was well apposed. There is mild ostial LAD plaque and more mid LAD plaque past the stent insertion.  Post-Intervention Lesion Assessment  The intervention was successful. Pre-interventional TIMI flow is 3. Post-intervention TIMI flow is 3. No complications occurred at this lesion.  There is a 0% residual stenosis post intervention.   Coronary Diagrams  Diagnostic Dominance: Right  Intervention    EKG: Normal sinus rhythm, left anterior fascicular block and nonspecific IVCD  Recent Labs: 08/24/2019: ALT 15; BUN 9; Creatinine, Ser 0.88; Hemoglobin 14.4; Platelets 165.0; Potassium 4.6; Sodium 141; TSH 1.54  Recent Lipid Panel    Component Value Date/Time    CHOL 194 08/24/2019 1040   CHOL 110 08/18/2018 1010   TRIG 341.0 (H) 08/24/2019 1040   HDL 45.30 08/24/2019 1040   HDL 39 (L) 08/18/2018 1010   CHOLHDL 4 08/24/2019  1040   VLDL 68.2 (H) 08/24/2019 1040   LDLCALC 27 08/18/2018 1010   LDLDIRECT 100.0 08/24/2019 1040    Physical Exam:    VS:  BP 138/82 (BP Location: Right Arm, Patient Position: Sitting, Cuff Size: Large)   Pulse 78   Ht 5\' 8"  (1.727 m)   Wt 259 lb 12.8 oz (117.8 kg)   SpO2 93%   BMI 39.50 kg/m     Wt Readings from Last 3 Encounters:  08/24/19 259 lb 12.8 oz (117.8 kg)  08/24/19 260 lb 12.8 oz (118.3 kg)  08/22/18 253 lb (114.8 kg)     GEN:  Well nourished, well developed in no acute distress HEENT: Normal NECK: No JVD; No carotid bruits LYMPHATICS: No lymphadenopathy CARDIAC: RRR, no murmurs, rubs, gallops RESPIRATORY:  Clear to auscultation without rales, wheezing or rhonchi  ABDOMEN: Soft, non-tender, non-distended MUSCULOSKELETAL:  No edema; No deformity  SKIN: Warm and dry NEUROLOGIC:  Alert and oriented x 3 PSYCHIATRIC:  Normal affect   ASSESSMENT:    1. Coronary artery disease involving native coronary artery of native heart with angina pectoris ([place])   2. Essential hypertension   3. Type 2 diabetes mellitus with other circulatory complication, without long-term current use of insulin ([place])   4. Mixed hyperlipidemia    PLAN:    In order of problems listed above:  1. The patient reports progressive symptoms of shortness of breath and chest discomfort now consistent with CCS class III angina.  I reviewed his previous cath films.  He had a good angiographic result with treatment of the LAD in-stent restenosis using a 4.0 mm drug-eluting stent.  He now has recurrent symptoms and he is at increased risk of in-stent restenosis after an initial in-stent restenosis requiring repeat DES implantation.  We discussed options of noninvasive testing versus definitive evaluation by cardiac catheterization.  I  would favor cardiac catheterization in this patient who has had proximal LAD stenting in the past.  He is agreeable with this approach.  Will arrange for this to be done on Dr Hassell Done next available cath lab day. I have reviewed the risks, indications, and alternatives to cardiac catheterization, possible angioplasty, and stenting with the patient. Risks include but are not limited to bleeding, infection, vascular injury, stroke, myocardial infection, arrhythmia, kidney injury, radiation-related injury in the case of prolonged fluoroscopy use, emergency cardiac surgery, and death. The patient understands the risks of serious complication is 1-2 in 7035 with diagnostic cardiac cath and 1-2% or less with angioplasty/stenting. I added isosorbide 15 mg daily to his regimen. He remains on ASA, clopidogrel, and metoprolol succinate.  2. BP controlled on lisinopril and metoprolol succinate. 3. Managed by PCP with metformin. 4. Treated with atorvastatin 80 mg, LDL one year ago 27 mg/dL.    Medication Adjustments/Labs and Tests Ordered: Current medicines are reviewed at length with the patient today.  Concerns regarding medicines are outlined above.  No orders of the defined types were placed in this encounter.  Meds ordered this encounter  Medications  . isosorbide mononitrate (IMDUR) 30 MG 24 hr tablet    Sig: Take 0.5 tablets (15 mg total) by mouth daily.    Dispense:  45 tablet    Refill:  3    Patient Instructions  Medication Instructions:  1) START IMDUR 15 mg daily *If you need a refill on your cardiac medications before your next appointment, please call your pharmacy*  Testing/Procedures: Your physician has requested that you have a cardiac catheterization. Cardiac  catheterization is used to diagnose and/or treat various heart conditions. Doctors may recommend this procedure for a number of different reasons. The most common reason is to evaluate chest pain. Chest pain can be a symptom of  coronary artery disease (CAD), and cardiac catheterization can show whether plaque is narrowing or blocking your heart's arteries. This procedure is also used to evaluate the valves, as well as measure the blood flow and oxygen levels in different parts of your heart. For further information please visit [email or web address]. Please follow instruction sheet, as given.   Follow-Up: You have an appointment with Melina Copa, Dr. Hassell Done assistant, on 09/12/19 at 9:15AM.   CATHETERIZATION INSTRUCTIONS: You are scheduled for a Cardiac Catheterization on: Thursday, June 24 with Dr. Irish Lack  1. Please arrive at the [facility] (Main Entrance A) at [facility]: [address] [place], [place] [address] at 5:30AM (This time is two hours before your procedure to ensure your preparation). Free valet parking service is available. You are allowed ONE visitor in the waiting room during your procedures. Both you and your guest must wear masks. Special note: Every effort is made to have your procedure done on time. Please understand that emergencies sometimes delay scheduled procedures.  2. Diet: Do not eat solid foods after midnight.  You may have clear liquids until 5am upon the day of the procedure.  3. Medication instructions in preparation for your procedure:  1) HOLD METFORMIN the morning of AND 2 days after your procedure  2) HOLD LASIX the morning of your cath  3) MAKE SURE TO TAKE YOUR ASPIRIN AND PLAVIX the morning of your cath  4) You may take your other meds as directed with sips of water.  4. Plan for one night stay--bring personal belongings. 5. Bring a current list of your medications and current insurance cards. 6. You MUST have a responsible person to drive you home. 7. Someone MUST be with you the first 24 hours after you arrive home or your discharge will be delayed. 8. Please wear clothes that are easy to get on and off and wear slip-on shoes.  Thank you for allowing [place] to care  for you!   -- [facility] Invasive Cardiovascular services     Signed, Sherren Mocha, MD  08/24/2019 7:42 PM    [place]

## 2019-08-24 NOTE — Telephone Encounter (Signed)
Received call from operator for patient in Dr. Rulon Sera office to discuss pt who is in his office with DOD, Dr. Burt Knack. Dr. Burt Knack discussed pt with Dr. Ernestine Conrad and advised that pt be seen in our office either today or can follow-up with Dr. Irish Lack early next week. Called and left message for pt to call our office to determine timing of appointment.

## 2019-08-24 NOTE — Progress Notes (Signed)
OFFICE VISIT  08/24/2019   CC:  Chief Complaint  Patient presents with  . Follow-up    RCI, pt is fasting   HPI:    Patient is a 74 y.o.  male who presents accompanied by his wife for 3 mo f/u HTN, HLD, DM 2, CAD.  I last saw him 05/19/18 (15 months ago) for his "establish care" visit. A/P as of last visit: "1) Chest pressure and DOE worrisome for unstable angina; pt with + hx of CAD w/stent. EKG unchanged compared to 2014 (most recent one in our system-->his cardiologist is with [facility]). He has not had his aspirin today. ASA 81mg  x 4 given here today.  Recommended pt go to the ED for further evaluation for ACS. He and wife expressed understanding and were agreeable with this plan and will go to ED now. I do feel it is appropriate to go by private vehicle rather than ems transport.  No lab work done today.  No new meds rx'd today. In near future he'll need routine labs if they are not done in ED/hospital. If he rules out, we will need to either get him back in with his cardiologist for further eval OR we'll do CXR, echo, and stress test ourselves."  INTERIM HX: He got stent placed when I sent him to hosp last year, DAPT x 1 yr, preserved LVEF, some DD present. It appears that his last cardiology f/u was June 2020.  DM: diet is poor.  No exercise.  No home glucose monitoring.  He got all his teeth extracted and has dentures at all.  HTN: no home bp monitoring.  BP high 1 mo ago when he had dental work. Compliant with meds.  Has "see food" diet. Exercise: walks from his home to his brother's shop, works some in yard.  HLD: taking his statin and fenofibrate.  CAD: He doesn't know how long he's been having probs again, but he does say he has occ substernal central CP sometimes at rest and sometimes with walking.  About 1-2 times per week.  Mild intensity, lasts 45.  Gets a HA with it.  Feels flushed.  No nausea.  No diaphoresis.  Gets L arm pain with the pain, no jaw pain.   Says he gets winded pretty easily.  No cough.  No fevers. He's a tough historian, vague/inprecise answers. He did use the nitro and it helped usually with one but occ had to take 2. He then says he lost all his nitro---unclear when. Most recent time he had this chest pain was approx 2 d/a. Also has pain in middle of back between shoulder blades often. Has noted some very mild LE swelling towards end of day.  He does not drink or smoke.  Past Medical History:  Diagnosis Date  . Coronary artery disease    a. DES to LAD 04/2011 - took Effient/ASA x 1 yr, now on ASA only. b. Cath 10/2012: patent stent, otherwise normal cors. 05/2018 Prox LAD stent 75% restenosed, DES placed.  Needs DAPT x 1 yr, then possible PLAVIX only.  . Diabetes mellitus with complication ([place]) 84/1324   A1c 6.9%  . Diabetic peripheral neuropathy ([place])   . GERD (gastroesophageal reflux disease)    HAS HAD ESOPHAGUS STRETCHED SEVERAL TIMES IN THE PAST  . Hyperlipidemia    goal LDL < 70  . Hypertension   . Myocardial infarction ([place])   . OSA on CPAP 10/18/2011   doesn't wear it  . Right knee  pain    TORN RIGHT KNEE MEDIAL MENSICAL TEAR  . Shortness of breath    WITH EXERTION  . Syncope    a. 10/2012.    Past Surgical History:  Procedure Laterality Date  . CARDIAC CATHETERIZATION  06/2017   In-stent restenosis cleared out  . CARDIOVASCULAR STRESS TEST     Latter part of 2019--normal per cardiologist's note from 10/2017  . CERVICAL FUSION  1990 and 1993   X 2   SURGERIES    SLIGHT LIMITATION ROM  . COLONOSCOPY  11/24/2004; 2018   NORMAL.  Recall 2016.  Repeat 2018 normal per pt.  . CORONARY ANGIOPLASTY WITH STENT PLACEMENT    . CORONARY STENT INTERVENTION N/A 05/22/2018   Prox LAD for in stent restenosis.  DAPT x 1 yr.  Procedure: CORONARY STENT INTERVENTION;  Surgeon: Jettie Booze, MD;  Location: [facility];  Service: Cardiovascular;  Laterality: N/A;  . INTRAVASCULAR ULTRASOUND/IVUS N/A 05/22/2018    Procedure: Intravascular Ultrasound/IVUS;  Surgeon: Jettie Booze, MD;  Location: [facility];  Service: Cardiovascular;  Laterality: N/A;  . KNEE ARTHROSCOPY  10/22/2011   Procedure: ARTHROSCOPY KNEE;  Surgeon: Gearlean Alf, MD;  Location: [facility];  Service: Orthopedics;  Laterality: Right;  Right knee scope with debridement  . LAPAROSCOPIC CHOLECYSTECTOMY  2018  . LEFT HEART CATH AND CORONARY ANGIOGRAPHY N/A 05/22/2018   DES placed for in stent restenosis.  EF 55-60%.  Procedure: LEFT HEART CATH AND CORONARY ANGIOGRAPHY;  Surgeon: Jettie Booze, MD;  Location: [facility];  Service: Cardiovascular;  Laterality: N/A;  . LEFT HEART CATHETERIZATION WITH CORONARY ANGIOGRAM N/A 10/08/2012   Procedure: LEFT HEART CATHETERIZATION WITH CORONARY ANGIOGRAM;  Surgeon: Lorretta Harp, MD;  Location: [facility];  Service: Cardiovascular;  Laterality: N/A;  . NASAL SINUS SURGERY     ONE SINUS SURGERY THRU NOSE AND ANOTHER SINUS SURGERY THRU INCISION ABOVE RT EYE  . TRANSTHORACIC ECHOCARDIOGRAM  05/21/2018   EF 60-65%, grd I DD, no valvular probs.    Outpatient Medications Prior to Visit  Medication Sig Dispense Refill  . aspirin EC 81 MG EC tablet Take 1 tablet (81 mg total) by mouth daily.    Marland Kitchen atorvastatin (LIPITOR) 80 MG tablet TAKE 1 TABLET (80 MG TOTAL) BY MOUTH DAILY AT 6 PM. 90 tablet 1  . cholecalciferol (VITAMIN D) 1000 UNITS tablet Take 2,000 Units by mouth every evening.     . clopidogrel (PLAVIX) 75 MG tablet Take 1 tablet (75 mg total) by mouth daily with breakfast. 90 tablet 3  . fenofibrate (TRICOR) 145 MG tablet Take 1 tablet (145 mg total) by mouth daily. 90 tablet 3  . furosemide (LASIX) 40 MG tablet Take 40 mg by mouth daily.     Marland Kitchen gabapentin (NEURONTIN) 300 MG capsule Take 1 capsule (300 mg total) by mouth 2 (two) times daily. 30 capsule 0  . lisinopril (ZESTRIL) 40 MG tablet Take 1 tablet (40 mg total) by mouth daily. Please call and schedule overdue  follow up appt for further refills [phone] 30 tablet 0  . metFORMIN (GLUCOPHAGE) 500 MG tablet Take 1 tablet (500 mg total) by mouth 2 (two) times daily with a meal.    . metoprolol succinate (TOPROL-XL) 50 MG 24 hr tablet Take 50 mg by mouth every evening.     . pantoprazole (PROTONIX) 40 MG tablet Take 40 mg by mouth every evening.     . nitroGLYCERIN (NITROSTAT) 0.4 MG SL  tablet Place 1 tablet (0.4 mg total) under the tongue every 5 (five) minutes as needed for chest pain (up to 3 doses). (Patient not taking: Reported on 08/24/2019) 25 tablet 3  . amoxicillin-clavulanate (AUGMENTIN) 875-125 MG tablet Take 1 tablet by mouth 2 (two) times daily. (Patient not taking: Reported on 08/24/2019) 20 tablet 0   No facility-administered medications prior to visit.    No Known Allergies  ROS As per HPI: plus: no fevers, no wheezing, no cough, no dizziness,  no rashes, no melena/hematochezia.  No polyuria or polydipsia.  No myalgias but chronic L knee pain, uses a cane lately.  No focal weakness, paresthesias, or tremors.  No acute vision or hearing abnormalities. No n/v/d or abd pain.  No palpitations.    PE: Vitals with BMI 08/24/2019 08/22/2018 08/11/2018  Height 5\' 8"  5\' 8"  5\' 8"   Weight 260 lbs 13 oz 253 lbs 255 lbs  BMI 39.66 74.12 87.86  Systolic 767 209 470  Diastolic 85 76 76  Pulse 70 79 68  O2 sat 97% on RA today  Gen: Alert, well appearing.  Patient is oriented to person, place, time, and situation. AFFECT: pleasant, lucid thought and speech. JGG:EZMO: no injection, icteris, swelling, or exudate.  EOMI, PERRLA. Mouth: lips without lesion/swelling.  Oral mucosa pink and moist. Oropharynx without erythema, exudate, or swelling.  Neck: supple/nontender.  No LAD, mass, or TM.  Carotid pulses 2+ bilaterally, without bruits. CV: RRR, no m/r/g.   LUNGS: CTA bilat, nonlabored resps, good aeration in all lung fields. ABD: soft, NT, ND, BS normal.  No hepatospenomegaly or mass.  No  bruits. EXT: no clubbing or cyanosis.  no edema.    LABS:  Lab Results  Component Value Date   TSH 2.823 05/19/2018   Lab Results  Component Value Date   WBC 8.0 05/22/2018   HGB 14.1 05/22/2018   HCT 43.5 05/22/2018   MCV 96.5 05/22/2018   PLT 176 05/22/2018   Lab Results  Component Value Date   CREATININE 1.02 08/18/2018   BUN 15 08/18/2018   NA 141 08/18/2018   K 4.1 08/18/2018   CL 103 08/18/2018   CO2 23 08/18/2018   Lab Results  Component Value Date   ALT 13 08/18/2018   AST 18 08/18/2018   ALKPHOS 70 08/18/2018   BILITOT 0.6 08/18/2018   Lab Results  Component Value Date   CHOL 110 08/18/2018   Lab Results  Component Value Date   HDL 39 (L) 08/18/2018   Lab Results  Component Value Date   LDLCALC 27 08/18/2018   Lab Results  Component Value Date   TRIG 220 (H) 08/18/2018   Lab Results  Component Value Date   CHOLHDL 2.8 08/18/2018   Lab Results  Component Value Date   HGBA1C 6.9 (H) 05/21/2018   12 lead EKG today: NSR, rate 67, left ant fascicular block, LVH criteria in aVL. TWI in V1 and TINY Q wave in V2.  No ectopy.  Normal intervals and his QRS duration is 116 msec. Compared to EKG 05/22/18 the TWI in V1 and tiny Q wave in V2 are new.  IMPRESSION AND PLAN:  1) CAD with angina vs [place].  NOT having any symptoms at this current time. Symptoms are sometimes at rest, mild EKG changes compared to 05/2018. I talked to cardiology "doc of the day", Dr. Burt Knack. He felt like the EKG changes represented a bit of progression of conduction disturbance, not ischemia or suggestive of past MI.  He agreed that pt should be worked in to their clinic but nothing urgent was needed. I did RF pt's nitroglycerin tabs today. CBC today.  2) uncontrolled HTN: increase toprol xl to 100mg  qd. TSH and CBC today.  3) DM: noncompliant with diet/exercise. HbA1c, lytes/cr, and urine microalb/cr today.  4) HLD: tolerating statin and fibrate. FLP and hepatic panel  today.  An After Visit Summary was printed and given to the patient.  FOLLOW UP: No follow-ups on file. Next CPE any time (3 mo?)  Signed:  Crissie Sickles, MD           08/24/2019

## 2019-08-24 NOTE — Telephone Encounter (Signed)
I sent this RF in while he was here earlier today.

## 2019-08-24 NOTE — Telephone Encounter (Signed)
Received call from patient's wife. I advised her of the discussion between Drs. Irish Lack and [place] and wife states they would like to come in today to meet with Dr. Burt Knack. She understands that likely the plan will be to schedule cardiac cath with Dr. Beau Fanny for next week. Pt's wife verbalized understanding and thanked me for the call.

## 2019-08-24 NOTE — Telephone Encounter (Signed)
Patients wife called in wanting a refill request for patients medication. Pharmacy notified patient that a new Rx needed to be sent into the pharmacy before filing   nitroGLYCERIN (NITROSTAT) 0.4 MG SL tablet  [id number] - [place], [place] - [address] AT [address]

## 2019-08-24 NOTE — Telephone Encounter (Signed)
RX last filled in 2014 by cardiology.  OKay to fill?

## 2019-08-24 NOTE — Telephone Encounter (Signed)
Called patient's wife who states patient was confused about my previous call which was prior to his appointment today with Dr. Burt Knack. She denies questions and thanked me for the call.

## 2019-08-24 NOTE — Telephone Encounter (Signed)
Pt is in Dr. Rulon Sera office now. Dr. Ernestine Conrad called and wanted to have the patient come in to see DR. [place] ASAP. The patient came in for a routine follow-up, had an EKG done. Dr. Ernestine Conrad has a dx CAD with a question of unstable angina without chest pain

## 2019-08-24 NOTE — Patient Instructions (Signed)
Your cardiologist's office (Dr. Irish Lack) will call you to arrange an appointment. If you do not get a call by 3 pm today then call our office and let us know.

## 2019-08-24 NOTE — Patient Instructions (Addendum)
Medication Instructions:  1) START IMDUR 15 mg daily *If you need a refill on your cardiac medications before your next appointment, please call your pharmacy*  Testing/Procedures: Your physician has requested that you have a cardiac catheterization. Cardiac catheterization is used to diagnose and/or treat various heart conditions. Doctors may recommend this procedure for a number of different reasons. The most common reason is to evaluate chest pain. Chest pain can be a symptom of coronary artery disease (CAD), and cardiac catheterization can show whether plaque is narrowing or blocking your heart's arteries. This procedure is also used to evaluate the valves, as well as measure the blood flow and oxygen levels in different parts of your heart. For further information please visit [email or web address]. Please follow instruction sheet, as given.   Follow-Up: You have an appointment with Melina Copa, Dr. Hassell Done assistant, on 09/12/19 at 9:15AM.   CATHETERIZATION INSTRUCTIONS: You are scheduled for a Cardiac Catheterization on: Thursday, June 24 with Dr. Irish Lack  1. Please arrive at the [facility] (Main Entrance A) at [facility]: [address] [place], [place] [address] at 5:30AM (This time is two hours before your procedure to ensure your preparation). Free valet parking service is available. You are allowed ONE visitor in the waiting room during your procedures. Both you and your guest must wear masks. Special note: Every effort is made to have your procedure done on time. Please understand that emergencies sometimes delay scheduled procedures.  2. Diet: Do not eat solid foods after midnight.  You may have clear liquids until 5am upon the day of the procedure.  3. Medication instructions in preparation for your procedure:  1) HOLD METFORMIN the morning of AND 2 days after your procedure  2) HOLD LASIX the morning of your cath  3) MAKE SURE TO TAKE YOUR ASPIRIN AND PLAVIX the morning  of your cath  4) You may take your other meds as directed with sips of water.  4. Plan for one night stay--bring personal belongings. 5. Bring a current list of your medications and current insurance cards. 6. You MUST have a responsible person to drive you home. 7. Someone MUST be with you the first 24 hours after you arrive home or your discharge will be delayed. 8. Please wear clothes that are easy to get on and off and wear slip-on shoes.  Thank you for allowing [place] to care for you!   -- [facility]

## 2019-08-24 NOTE — Telephone Encounter (Signed)
    Went to chart to check who called pt. Transferred to [employer]

## 2019-08-24 NOTE — H&P (View-Only) (Signed)
Cardiology Office Note:    Date:  08/24/2019   ID:  KENDALL JUSTO, DOB 02/23/1946, MRN [id number]  PCP:  Tammi Sou, MD  [facility] Cardiologist:  Larae Grooms, MD  [facility] Electrophysiologist:  None   Referring MD: Tammi Sou, MD   Chief Complaint  Patient presents with  . Chest Pain    History of Present Illness:    Richard [name] is a 74 y.o. male with a hx of coronary artery disease, added on today because of complaints of chest pain.  The patient has been followed by Dr. Irish Lack, last seen August 22, 2018 and a telemedicine visit.  He has a history of PCI to the LAD in 2013.  Repeat heart catheterization in 2014 demonstrated stent patency.  He again presented with symptoms of unstable angina in March 2020 and underwent cardiac catheterization demonstrating severe in-stent restenosis in the previously implanted LAD stent.  He was treated with another drug-eluting stent with good result.  He was noted to have minor nonobstructive distal left main stenosis of 20% and patency of the RCA and left circumflex.  LVEF has been normal.  He has been continued on dual antiplatelet therapy with aspirin and clopidogrel.  He previously had headache with ticagrelor and at 1 point has been treated with prasugrel.  The patient was seen by his PCP, Dr Anitra Lauth, this morning. He was noted to have some changes on his EKG and complained of progressive dyspnea and intermittent chest pain. He underwent PCI in March 2020 and felt really good after that. He has developed progressive exertional dyspnea over the past few months. He is fatigued and also complains of intermittent chest discomfort. Describes this as a 'pressure' or a 'weight' on the chest, sometimes radiating to the left arm and sometimes through to the back. When he gardens, he has to sit and rest frequently.  The patient has also had some discomfort at rest.  Past Medical History:  Diagnosis Date  . Coronary artery  disease    a. DES to LAD 04/2011 - took Effient/ASA x 1 yr, now on ASA only. b. Cath 10/2012: patent stent, otherwise normal cors. 05/2018 Prox LAD stent 75% restenosed, DES placed.  Needs DAPT x 1 yr, then possible PLAVIX only.  . Diabetes mellitus with complication ([place]) 04/4095   A1c 6.9%  . Diabetic peripheral neuropathy ([place])   . GERD (gastroesophageal reflux disease)    HAS HAD ESOPHAGUS STRETCHED SEVERAL TIMES IN THE PAST  . Hyperlipidemia    goal LDL < 70  . Hypertension   . Myocardial infarction ([place])   . OSA on CPAP 10/18/2011   doesn't wear it  . Right knee pain    TORN RIGHT KNEE MEDIAL MENSICAL TEAR  . Shortness of breath    WITH EXERTION  . Syncope    a. 10/2012.    Past Surgical History:  Procedure Laterality Date  . CARDIAC CATHETERIZATION  06/2017   In-stent restenosis cleared out  . CARDIOVASCULAR STRESS TEST     Latter part of 2019--normal per cardiologist's note from 10/2017  . CERVICAL FUSION  1990 and 1993   X 2   SURGERIES    SLIGHT LIMITATION ROM  . COLONOSCOPY  11/24/2004; 2018   NORMAL.  Recall 2016.  Repeat 2018 normal per pt.  . CORONARY ANGIOPLASTY WITH STENT PLACEMENT    . CORONARY STENT INTERVENTION N/A 05/22/2018   Prox LAD for in stent restenosis.  DAPT x 1 yr.  Procedure: CORONARY STENT INTERVENTION;  Surgeon: Jettie Booze, MD;  Location: [facility];  Service: Cardiovascular;  Laterality: N/A;  . INTRAVASCULAR ULTRASOUND/IVUS N/A 05/22/2018   Procedure: Intravascular Ultrasound/IVUS;  Surgeon: Jettie Booze, MD;  Location: [place];  Service: Cardiovascular;  Laterality: N/A;  . KNEE ARTHROSCOPY  10/22/2011   Procedure: ARTHROSCOPY KNEE;  Surgeon: Gearlean Alf, MD;  Location: [facility];  Service: Orthopedics;  Laterality: Right;  Right knee scope with debridement  . LAPAROSCOPIC CHOLECYSTECTOMY  2018  . LEFT HEART CATH AND CORONARY ANGIOGRAPHY N/A 05/22/2018   DES placed for in stent restenosis.  EF 55-60%.  Procedure:  LEFT HEART CATH AND CORONARY ANGIOGRAPHY;  Surgeon: Jettie Booze, MD;  Location: [place];  Service: Cardiovascular;  Laterality: N/A;  . LEFT HEART CATHETERIZATION WITH CORONARY ANGIOGRAM N/A 10/08/2012   Procedure: LEFT HEART CATHETERIZATION WITH CORONARY ANGIOGRAM;  Surgeon: Lorretta Harp, MD;  Location: [facility] C[facility] LAB;  Service: Cardiovascular;  Laterality: N/A;  . NASAL SINUS SURGERY     ONE SINUS SURGERY THRU NOSE AND ANOTHER SINUS SURGERY THRU INCISION ABOVE RT EYE  . TRANSTHORACIC ECHOCARDIOGRAM  05/21/2018   EF 60-65%, grd I DD, no valvular probs.    Current Medications: Current Meds  Medication Sig  . aspirin EC 81 MG EC tablet Take 1 tablet (81 mg total) by mouth daily.  Marland Kitchen atorvastatin (LIPITOR) 80 MG tablet TAKE 1 TABLET (80 MG TOTAL) BY MOUTH DAILY AT 6 PM.  . cholecalciferol (VITAMIN D) 1000 UNITS tablet Take 2,000 Units by mouth every evening.   . clopidogrel (PLAVIX) 75 MG tablet Take 1 tablet (75 mg total) by mouth daily with breakfast.  . fenofibrate (TRICOR) 145 MG tablet Take 1 tablet (145 mg total) by mouth daily.  . furosemide (LASIX) 40 MG tablet Take 40 mg by mouth daily.   Marland Kitchen gabapentin (NEURONTIN) 300 MG capsule Take 1 capsule (300 mg total) by mouth 2 (two) times daily.  Marland Kitchen lisinopril (ZESTRIL) 40 MG tablet Take 1 tablet (40 mg total) by mouth daily. Please call and schedule overdue follow up appt for further refills [phone]  . metFORMIN (GLUCOPHAGE) 500 MG tablet Take 500 mg by mouth daily with breakfast.  . metoprolol succinate (TOPROL-XL) 100 MG 24 hr tablet Take 1 tablet (100 mg total) by mouth daily. Take with or immediately following a meal.  . pantoprazole (PROTONIX) 40 MG tablet Take 40 mg by mouth every evening.   . [DISCONTINUED] nitroGLYCERIN (NITROSTAT) 0.4 MG SL tablet Place 1 tablet (0.4 mg total) under the tongue every 5 (five) minutes as needed for chest pain (up to 3 doses).     Allergies:   Patient has no known allergies.    Social History   Socioeconomic History  . Marital status: Married    Spouse name: [name]  . Number of children: Not on file  . Years of education: Not on file  . Highest education level: Not on file  Occupational History  . Not on file  Tobacco Use  . Smoking status: Never Smoker  . Smokeless tobacco: Former Systems developer    Types: Secondary school teacher  . Vaping Use: Never used  Substance and Sexual Activity  . Alcohol use: No  . Drug use: No  . Sexual activity: Not on file  Other Topics Concern  . Not on file  Social History Narrative   Married, 2 daughters.  2 other daughters deceased.   Educ: HS  Occup: farmer, retired from [employer], Theme park manager at [employer].   No T/A/Ds   Social Determinants of Radio broadcast assistant Strain:   . Difficulty of Paying Living Expenses:   Food Insecurity:   . Worried About Charity fundraiser in the Last Year:   . Arboriculturist in the Last Year:   Transportation Needs:   . Film/video editor (Medical):   Marland Kitchen Lack of Transportation (Non-Medical):   Physical Activity:   . Days of Exercise per Week:   . Minutes of Exercise per Session:   Stress:   . Feeling of Stress :   Social Connections:   . Frequency of Communication with Friends and Family:   . Frequency of Social Gatherings with Friends and Family:   . Attends Religious Services:   . Active Member of Clubs or Organizations:   . Attends Archivist Meetings:   Marland Kitchen Marital Status:      Family History: The patient's family history includes Arthritis in his mother; Cancer in his father.  ROS:   Please see the history of present illness.    All other systems reviewed and are negative.  EKGs/Labs/Other Studies Reviewed:    The following studies were reviewed today: Cardiac Cath 05-22-2018: Conclusion    The left ventricular systolic function is normal.  LV end diastolic pressure is normal.  The left ventricular ejection fraction is 55-65% by visual  estimate.  There is no aortic valve stenosis.  Dist LM lesion is 20% stenosed.  Prox LAD lesion is 75% stenosed. THis was instent restenosis.  A drug-eluting stent was successfully placed using a STENT SYNERGY DES 4X38, postdilated to 4.5 mm  Post intervention, there is a 0% residual stenosis. Post procedure IVUS showed well apposed stent.   DAPT for 12 months.  Consider clopidogrel monotherapy after that along with aggressive secondary prevention.    Possible discharge this evening.   Coronary Findings  Diagnostic Dominance: Right Left Main  Dist LM lesion is 20% stenosed.  Left Anterior Descending  Prox LAD lesion is 75% stenosed. The lesion was previously treated using a stent (unknown type) over 2 years ago.  Mid LAD lesion is 25% stenosed.  Intervention  Prox LAD lesion  Stent  CATHETER LAUNCHER 6FREBU 3.75 guide catheter was inserted. Lesion crossed with guidewire using a WIRE ASAHI PROWATER 180CM. Pre-stent angioplasty was performed using a BALLOON WOL[place]INE 3.00X10. A drug-eluting stent was successfully placed using a STENT SYNERGY DES 4X38. Stent strut is well apposed. Post-stent angioplasty was performed using a BALLOON [place] SAPPHIRE 4.5X15. IVUS performed shows the true vessel diameter is about 5 mm. The stent was well apposed. There is mild ostial LAD plaque and more mid LAD plaque past the stent insertion.  Post-Intervention Lesion Assessment  The intervention was successful. Pre-interventional TIMI flow is 3. Post-intervention TIMI flow is 3. No complications occurred at this lesion.  There is a 0% residual stenosis post intervention.   Coronary Diagrams  Diagnostic Dominance: Right  Intervention    EKG: Normal sinus rhythm, left anterior fascicular block and nonspecific IVCD  Recent Labs: 08/24/2019: ALT 15; BUN 9; Creatinine, Ser 0.88; Hemoglobin 14.4; Platelets 165.0; Potassium 4.6; Sodium 141; TSH 1.54  Recent Lipid Panel    Component Value Date/Time    CHOL 194 08/24/2019 1040   CHOL 110 08/18/2018 1010   TRIG 341.0 (H) 08/24/2019 1040   HDL 45.30 08/24/2019 1040   HDL 39 (L) 08/18/2018 1010   CHOLHDL 4 08/24/2019  1040   VLDL 68.2 (H) 08/24/2019 1040   LDLCALC 27 08/18/2018 1010   LDLDIRECT 100.0 08/24/2019 1040    Physical Exam:    VS:  BP 138/82 (BP Location: Right Arm, Patient Position: Sitting, Cuff Size: Large)   Pulse 78   Ht 5\' 8"  (1.727 m)   Wt 259 lb 12.8 oz (117.8 kg)   SpO2 93%   BMI 39.50 kg/m     Wt Readings from Last 3 Encounters:  08/24/19 259 lb 12.8 oz (117.8 kg)  08/24/19 260 lb 12.8 oz (118.3 kg)  08/22/18 253 lb (114.8 kg)     GEN:  Well nourished, well developed in no acute distress HEENT: Normal NECK: No JVD; No carotid bruits LYMPHATICS: No lymphadenopathy CARDIAC: RRR, no murmurs, rubs, gallops RESPIRATORY:  Clear to auscultation without rales, wheezing or rhonchi  ABDOMEN: Soft, non-tender, non-distended MUSCULOSKELETAL:  No edema; No deformity  SKIN: Warm and dry NEUROLOGIC:  Alert and oriented x 3 PSYCHIATRIC:  Normal affect   ASSESSMENT:    1. Coronary artery disease involving native coronary artery of native heart with angina pectoris ([place])   2. Essential hypertension   3. Type 2 diabetes mellitus with other circulatory complication, without long-term current use of insulin ([place])   4. Mixed hyperlipidemia    PLAN:    In order of problems listed above:  1. The patient reports progressive symptoms of shortness of breath and chest discomfort now consistent with CCS class III angina.  I reviewed his previous cath films.  He had a good angiographic result with treatment of the LAD in-stent restenosis using a 4.0 mm drug-eluting stent.  He now has recurrent symptoms and he is at increased risk of in-stent restenosis after an initial in-stent restenosis requiring repeat DES implantation.  We discussed options of noninvasive testing versus definitive evaluation by cardiac catheterization.  I  would favor cardiac catheterization in this patient who has had proximal LAD stenting in the past.  He is agreeable with this approach.  Will arrange for this to be done on Dr Hassell Done next available cath lab day. I have reviewed the risks, indications, and alternatives to cardiac catheterization, possible angioplasty, and stenting with the patient. Risks include but are not limited to bleeding, infection, vascular injury, stroke, myocardial infection, arrhythmia, kidney injury, radiation-related injury in the case of prolonged fluoroscopy use, emergency cardiac surgery, and death. The patient understands the risks of serious complication is 1-2 in 9163 with diagnostic cardiac cath and 1-2% or less with angioplasty/stenting. I added isosorbide 15 mg daily to his regimen. He remains on ASA, clopidogrel, and metoprolol succinate.  2. BP controlled on lisinopril and metoprolol succinate. 3. Managed by PCP with metformin. 4. Treated with atorvastatin 80 mg, LDL one year ago 27 mg/dL.    Medication Adjustments/Labs and Tests Ordered: Current medicines are reviewed at length with the patient today.  Concerns regarding medicines are outlined above.  No orders of the defined types were placed in this encounter.  Meds ordered this encounter  Medications  . isosorbide mononitrate (IMDUR) 30 MG 24 hr tablet    Sig: Take 0.5 tablets (15 mg total) by mouth daily.    Dispense:  45 tablet    Refill:  3    Patient Instructions  Medication Instructions:  1) START IMDUR 15 mg daily *If you need a refill on your cardiac medications before your next appointment, please call your pharmacy*  Testing/Procedures: Your physician has requested that you have a cardiac catheterization. Cardiac  catheterization is used to diagnose and/or treat various heart conditions. Doctors may recommend this procedure for a number of different reasons. The most common reason is to evaluate chest pain. Chest pain can be a symptom of  coronary artery disease (CAD), and cardiac catheterization can show whether plaque is narrowing or blocking your heart's arteries. This procedure is also used to evaluate the valves, as well as measure the blood flow and oxygen levels in different parts of your heart. For further information please visit [facility]. Please follow instruction sheet, as given.   Follow-Up: You have an appointment with Melina Copa, Dr. Hassell Done assistant, on 09/12/19 at 9:15AM.   CATHETERIZATION INSTRUCTIONS: You are scheduled for a Cardiac Catheterization on: Thursday, June 24 with Dr. Irish Lack  1. Please arrive at the [facility] (Main Entrance A) at [facility]: [address] [place], [place] [address] at 5:30AM (This time is two hours before your procedure to ensure your preparation). Free valet parking service is available. You are allowed ONE visitor in the waiting room during your procedures. Both you and your guest must wear masks. Special note: Every effort is made to have your procedure done on time. Please understand that emergencies sometimes delay scheduled procedures.  2. Diet: Do not eat solid foods after midnight.  You may have clear liquids until 5am upon the day of the procedure.  3. Medication instructions in preparation for your procedure:  1) HOLD METFORMIN the morning of AND 2 days after your procedure  2) HOLD LASIX the morning of your cath  3) MAKE SURE TO TAKE YOUR ASPIRIN AND PLAVIX the morning of your cath  4) You may take your other meds as directed with sips of water.  4. Plan for one night stay--bring personal belongings. 5. Bring a current list of your medications and current insurance cards. 6. You MUST have a responsible person to drive you home. 7. Someone MUST be with you the first 24 hours after you arrive home or your discharge will be delayed. 8. Please wear clothes that are easy to get on and off and wear slip-on shoes.  Thank you for allowing [place] to care  for you!   -- [facility] Invasive Cardiovascular services     Signed, Sherren Mocha, MD  08/24/2019 7:42 PM    [place]

## 2019-08-24 NOTE — Telephone Encounter (Signed)
Patient's spouse calling to speak with Sharyn Lull again.

## 2019-08-27 ENCOUNTER — Other Ambulatory Visit: Payer: Self-pay | Admitting: Family Medicine

## 2019-08-27 MED ORDER — METOPROLOL SUCCINATE ER 100 MG PO TB24: 100.0000 mg | ORAL_TABLET | Freq: Two times a day (BID) | ORAL | 3 refills | Status: DC

## 2019-08-28 ENCOUNTER — Telehealth: Payer: Self-pay | Admitting: *Deleted

## 2019-08-28 NOTE — Telephone Encounter (Addendum)
Pt contacted pre-catheterization scheduled at [facility] for: Thursday August 30, 2019 7:30 AM Verified arrival time and place: [facility]) at: 5:30 AM   No solid food after midnight prior to cath, clear liquids until 5 AM day of procedure.  Hold: Metformin-day of procedure and 48 hours post procedure. Lasix-AM of procedure  Except hold medications AM meds can be  taken pre-cath with sips of water including: ASA 81 mg Plavix 75 mg  Confirmed patient has responsible adult to drive home post procedure and observe 24 hours after arriving home: yes  You are allowed ONE visitor in the waiting room during your procedure. Both you and your visitor must wear masks.      COVID-19 Pre-Screening Questions:   In the past 7 to 10 days have you had a new cough, shortness of breath, headache, congestion, fever (100 or greater) unexplained body aches, new sore throat, or sudden loss of taste or sense of smell? no  In the past 7 to 10 days have you been around anyone with known Covid 19? No  Reviewed procedure/mask/visitor instructions, COVID-19 screening questions with patient's wife(DPR), [name].

## 2019-08-30 ENCOUNTER — Other Ambulatory Visit: Payer: Self-pay

## 2019-08-30 ENCOUNTER — Ambulatory Visit (HOSPITAL_COMMUNITY)
Admission: RE | Admit: 2019-08-30 | Discharge: 2019-08-30 | Disposition: A | Discharge status: Home / Self Care | Payer: HMO | Attending: Interventional Cardiology | Admitting: Interventional Cardiology

## 2019-08-30 ENCOUNTER — Encounter (HOSPITAL_COMMUNITY): Payer: Self-pay | Admitting: Interventional Cardiology

## 2019-08-30 ENCOUNTER — Encounter (HOSPITAL_COMMUNITY)
Admission: RE | Disposition: A | Discharge status: Home / Self Care | Payer: Self-pay | Source: Home / Self Care | Attending: Interventional Cardiology

## 2019-08-30 ENCOUNTER — Other Ambulatory Visit: Payer: Self-pay | Admitting: Interventional Cardiology

## 2019-08-30 DIAGNOSIS — G4733 Obstructive sleep apnea (adult) (pediatric): Secondary | ICD-10-CM | POA: Insufficient documentation

## 2019-08-30 DIAGNOSIS — I25118 Atherosclerotic heart disease of native coronary artery with other forms of angina pectoris: Secondary | ICD-10-CM

## 2019-08-30 DIAGNOSIS — K219 Gastro-esophageal reflux disease without esophagitis: Secondary | ICD-10-CM | POA: Insufficient documentation

## 2019-08-30 DIAGNOSIS — Z79899 Other long term (current) drug therapy: Secondary | ICD-10-CM | POA: Diagnosis not present

## 2019-08-30 DIAGNOSIS — I252 Old myocardial infarction: Secondary | ICD-10-CM | POA: Insufficient documentation

## 2019-08-30 DIAGNOSIS — Z7902 Long term (current) use of antithrombotics/antiplatelets: Secondary | ICD-10-CM | POA: Diagnosis not present

## 2019-08-30 DIAGNOSIS — E1140 Type 2 diabetes mellitus with diabetic neuropathy, unspecified: Secondary | ICD-10-CM | POA: Diagnosis not present

## 2019-08-30 DIAGNOSIS — Z7984 Long term (current) use of oral hypoglycemic drugs: Secondary | ICD-10-CM | POA: Diagnosis not present

## 2019-08-30 DIAGNOSIS — I25119 Atherosclerotic heart disease of native coronary artery with unspecified angina pectoris: Secondary | ICD-10-CM | POA: Diagnosis not present

## 2019-08-30 DIAGNOSIS — E782 Mixed hyperlipidemia: Secondary | ICD-10-CM | POA: Diagnosis not present

## 2019-08-30 DIAGNOSIS — I10 Essential (primary) hypertension: Secondary | ICD-10-CM | POA: Diagnosis not present

## 2019-08-30 DIAGNOSIS — Z7982 Long term (current) use of aspirin: Secondary | ICD-10-CM | POA: Diagnosis not present

## 2019-08-30 HISTORY — PX: LEFT HEART CATH AND CORONARY ANGIOGRAPHY: CATH118249

## 2019-08-30 LAB — GLUCOSE, CAPILLARY: Glucose-Capillary: 175 mg/dL — ABNORMAL HIGH (ref 70–99)

## 2019-08-30 SURGERY — LEFT HEART CATH AND CORONARY ANGIOGRAPHY
Anesthesia: LOCAL

## 2019-08-30 MED ORDER — LIDOCAINE HCL (PF) 1 % IJ SOLN
INTRAMUSCULAR | Status: AC
  Filled 2019-08-30: qty 30

## 2019-08-30 MED ORDER — SODIUM CHLORIDE 0.9 % WEIGHT BASED INFUSION: 1.0000 mL/kg/h | INTRAVENOUS | Status: DC

## 2019-08-30 MED ORDER — FENTANYL CITRATE (PF) 100 MCG/2ML IJ SOLN
INTRAMUSCULAR | Status: DC | PRN
  Administered 2019-08-30: 25 ug via INTRAVENOUS

## 2019-08-30 MED ORDER — SODIUM CHLORIDE 0.9 % IV SOLN: 250.0000 mL | INTRAVENOUS | Status: DC | PRN

## 2019-08-30 MED ORDER — SODIUM CHLORIDE 0.9% FLUSH: 3.0000 mL | Freq: Two times a day (BID) | INTRAVENOUS | Status: DC

## 2019-08-30 MED ORDER — HEPARIN SODIUM (PORCINE) 1000 UNIT/ML IJ SOLN
INTRAMUSCULAR | Status: AC
  Filled 2019-08-30: qty 1

## 2019-08-30 MED ORDER — MIDAZOLAM HCL 2 MG/2ML IJ SOLN
INTRAMUSCULAR | Status: AC
  Filled 2019-08-30: qty 2

## 2019-08-30 MED ORDER — HEPARIN (PORCINE) IN NACL 1000-0.9 UT/500ML-% IV SOLN
INTRAVENOUS | Status: AC
  Filled 2019-08-30: qty 1000

## 2019-08-30 MED ORDER — IOHEXOL 350 MG/ML SOLN
INTRAVENOUS | Status: DC | PRN
  Administered 2019-08-30: 65 mL

## 2019-08-30 MED ORDER — METFORMIN HCL 500 MG PO TABS: 500.0000 mg | ORAL_TABLET | Freq: Two times a day (BID) | ORAL | Status: DC

## 2019-08-30 MED ORDER — VERAPAMIL HCL 2.5 MG/ML IV SOLN
INTRAVENOUS | Status: DC | PRN
  Administered 2019-08-30: 10 mL via INTRA_ARTERIAL

## 2019-08-30 MED ORDER — ASPIRIN 81 MG PO CHEW: 81.0000 mg | CHEWABLE_TABLET | ORAL | Status: AC

## 2019-08-30 MED ORDER — MIDAZOLAM HCL 2 MG/2ML IJ SOLN
INTRAMUSCULAR | Status: DC | PRN
  Administered 2019-08-30: 2 mg via INTRAVENOUS

## 2019-08-30 MED ORDER — ONDANSETRON HCL 4 MG/2ML IJ SOLN: 4.0000 mg | Freq: Four times a day (QID) | INTRAMUSCULAR | Status: DC | PRN

## 2019-08-30 MED ORDER — SODIUM CHLORIDE 0.9 % IV SOLN: INTRAVENOUS | Status: AC

## 2019-08-30 MED ORDER — VERAPAMIL HCL 2.5 MG/ML IV SOLN
INTRAVENOUS | Status: AC
  Filled 2019-08-30: qty 2

## 2019-08-30 MED ORDER — SODIUM CHLORIDE 0.9 % WEIGHT BASED INFUSION
3.0000 mL/kg/h | INTRAVENOUS | Status: AC
  Administered 2019-08-30: 3 mL/kg/h via INTRAVENOUS

## 2019-08-30 MED ORDER — HEPARIN SODIUM (PORCINE) 1000 UNIT/ML IJ SOLN
INTRAMUSCULAR | Status: DC | PRN
  Administered 2019-08-30: 6000 [IU] via INTRAVENOUS

## 2019-08-30 MED ORDER — SODIUM CHLORIDE 0.9% FLUSH: 3.0000 mL | INTRAVENOUS | Status: DC | PRN

## 2019-08-30 MED ORDER — LABETALOL HCL 5 MG/ML IV SOLN: 10.0000 mg | INTRAVENOUS | Status: DC | PRN

## 2019-08-30 MED ORDER — HYDRALAZINE HCL 20 MG/ML IJ SOLN: 10.0000 mg | INTRAMUSCULAR | Status: DC | PRN

## 2019-08-30 MED ORDER — HEPARIN (PORCINE) IN NACL 1000-0.9 UT/500ML-% IV SOLN
INTRAVENOUS | Status: DC | PRN
  Administered 2019-08-30 (×2): 500 mL

## 2019-08-30 MED ORDER — ACETAMINOPHEN 325 MG PO TABS: 650.0000 mg | ORAL_TABLET | ORAL | Status: DC | PRN

## 2019-08-30 MED ORDER — FENTANYL CITRATE (PF) 100 MCG/2ML IJ SOLN
INTRAMUSCULAR | Status: AC
  Filled 2019-08-30: qty 2

## 2019-08-30 MED ORDER — LIDOCAINE HCL (PF) 1 % IJ SOLN
INTRAMUSCULAR | Status: DC | PRN
  Administered 2019-08-30: 2 mL

## 2019-08-30 MED ORDER — CLOPIDOGREL BISULFATE 75 MG PO TABS
75.0000 mg | ORAL_TABLET | ORAL | Status: AC
  Administered 2019-08-30: 75 mg via ORAL
  Filled 2019-08-30: qty 1

## 2019-08-30 SURGICAL SUPPLY — 10 items
CATH 5FR JL3.5 JR4 ANG PIG MP (CATHETERS) ×1 IMPLANT
DEVICE RAD COMP TR BAND LRG (VASCULAR PRODUCTS) ×1 IMPLANT
GLIDESHEATH SLEND SS 6F .021 (SHEATH) ×1 IMPLANT
GUIDEWIRE INQWIRE 1.5J.035X260 (WIRE) IMPLANT
INQWIRE 1.5J .035X260CM (WIRE) ×2
KIT HEART LEFT (KITS) ×2 IMPLANT
PACK CARDIAC CATHETERIZATION (CUSTOM PROCEDURE TRAY) ×2 IMPLANT
SHEATH PROBE COVER 6X72 (BAG) ×1 IMPLANT
TRANSDUCER W/STOPCOCK (MISCELLANEOUS) ×2 IMPLANT
TUBING CIL FLEX 10 FLL-RA (TUBING) ×2 IMPLANT

## 2019-08-30 NOTE — Interval H&P Note (Signed)
Cath Lab Visit (complete for each Cath Lab visit)  Clinical Evaluation Leading to the Procedure:   ACS: Yes.    Non-ACS:    Anginal Classification: CCS III  Anti-ischemic medical therapy: Minimal Therapy (1 class of medications)  Non-Invasive Test Results: No non-invasive testing performed  Prior CABG: No previous CABG      History and Physical Interval Note:  08/30/2019 7:38 AM  Richard [name]  has presented today for surgery, with the diagnosis of CAD.  The various methods of treatment have been discussed with the patient and family. After consideration of risks, benefits and other options for treatment, the patient has consented to  Procedure(s): LEFT HEART CATH AND CORONARY ANGIOGRAPHY (N/A) as a surgical intervention.  The patient's history has been reviewed, patient examined, no change in status, stable for surgery.  I have reviewed the patient's chart and labs.  Questions were answered to the patient's satisfaction.     Larae Grooms

## 2019-08-30 NOTE — Discharge Instructions (Signed)
Radial Site Care  This sheet gives you information about how to care for yourself after your procedure. Your health care provider may also give you more specific instructions. If you have problems or questions, contact your health care provider. What can I expect after the procedure? After the procedure, it is common to have:  Bruising and tenderness at the catheter insertion area. Follow these instructions at home: Medicines  Take over-the-counter and prescription medicines only as told by your health care provider. Insertion site care  Follow instructions from your health care provider about how to take care of your insertion site. Make sure you: ? Wash your hands with soap and water before you change your bandage (dressing). If soap and water are not available, use hand sanitizer. ? Change your dressing as told by your health care provider. ? Leave stitches (sutures), skin glue, or adhesive strips in place. These skin closures may need to stay in place for 2 weeks or longer. If adhesive strip edges start to loosen and curl up, you may trim the loose edges. Do not remove adhesive strips completely unless your health care provider tells you to do that.  Check your insertion site every day for signs of infection. Check for: ? Redness, swelling, or pain. ? Fluid or blood. ? Pus or a bad smell. ? Warmth.  Do not take baths, swim, or use a hot tub until your health care provider approves.  You may shower 24-48 hours after the procedure, or as directed by your health care provider. ? Remove the dressing and gently wash the site with plain soap and water. ? Pat the area dry with a clean towel. ? Do not rub the site. That could cause bleeding.  Do not apply powder or lotion to the site. Activity   For 24 hours after the procedure, or as directed by your health care provider: ? Do not flex or bend the affected arm. ? Do not push or pull heavy objects with the affected arm. ? Do not  drive yourself home from the hospital or clinic. You may drive 24 hours after the procedure unless your health care provider tells you not to. ? Do not operate machinery or power tools.  Do not lift anything that is heavier than 10 lb (4.5 kg), or the limit that you are told, until your health care provider says that it is safe.  Ask your health care provider when it is okay to: ? Return to work or school. ? Resume usual physical activities or sports. ? Resume sexual activity. General instructions  If the catheter site starts to bleed, raise your arm and put firm pressure on the site. If the bleeding does not stop, get help right away. This is a medical emergency.  If you went home on the same day as your procedure, a responsible adult should be with you for the first 24 hours after you arrive home.  Keep all follow-up visits as told by your health care provider. This is important. Contact a health care provider if:  You have a fever.  You have redness, swelling, or yellow drainage around your insertion site. Get help right away if:  You have unusual pain at the radial site.  The catheter insertion area swells very fast.  The insertion area is bleeding, and the bleeding does not stop when you hold steady pressure on the area.  Your arm or hand becomes pale, cool, tingly, or numb. These symptoms may represent a serious problem   that is an emergency. Do not wait to see if the symptoms will go away. Get medical help right away. Call your local emergency services (911 in the [place] Do not drive yourself to the hospital. Summary  After the procedure, it is common to have bruising and tenderness at the site.  Follow instructions from your health care provider about how to take care of your radial site wound. Check the wound every day for signs of infection.  Do not lift anything that is heavier than 10 lb (4.5 kg), or the limit that you are told, until your health care provider says  that it is safe. This information is not intended to replace advice given to you by your health care provider. Make sure you discuss any questions you have with your health care provider. Document Revised: 03/30/2017 Document Reviewed: 03/30/2017 Elsevier Patient Education  2020 [employer] Inc[employer]  

## 2019-08-31 ENCOUNTER — Other Ambulatory Visit: Payer: Self-pay

## 2019-08-31 NOTE — Patient Outreach (Signed)
  [facility]) Care Management Chronic Special Needs Program  08/31/2019  Name: Richard [name] DOB: March 26, 1945  MRN: [id number]  [name] is enrolled in a chronic special needs plan for Diabetes. Client called with no answer No answer and HIPAA compliant message left. 2nd attempt Plan for 3rd outreach call in 1-2 weeks Chronic care management coordinator will attempt outreach in 1-2  weeks .   Peter Garter RN, Jackquline Denmark, CDE Chronic Care Management Coordinator [facility] Care Management [phone]

## 2019-09-05 ENCOUNTER — Other Ambulatory Visit: Payer: Self-pay

## 2019-09-05 NOTE — Patient Outreach (Signed)
[facility]) Care Management Chronic Special Needs Program  09/05/2019  Name: TAHJAY BINION DOB: [birth year]  MRN: [id number]  Mr. Fishel Wamble is enrolled in a chronic special needs plan for  Diabetes.  Client called with no answer and HIPAA compliant message left.  3rd attempt A completed health risk assessment has not been received from the client and client has not responded to 3 outreach attempts by [facility] to complete 2021 Health Risk Assessment  The client's individualized care plan was developed based on available data and previous 2020 Health Risk Assessment Goals Addressed            This Visit's Progress   .  Acknowledge receipt of Advanced Directive package   Not on track    Sent Advanced Directives  packet and EMMI: Advanced Directives      . Client understands the importance of follow-up with providers by attending scheduled visits   On track    provider visit  08/24/19 Plan to schedule appointments with providers    . Client will report no worsening of symptoms related to heart disease within the next 12 months   No change    Heart cath on 08/30/19 Plan to keep follow up appointments with cardiology Follow a low salt diet  Notify provider for symptoms of chest pain, sweating, nausea/vomiting, irregular heartbeat, palpitations, rapid heart rate, shortness of breath or dizziness or fainting. Call 911 for severe symptoms of chest pain or shortness of breath. Take medications as prescribed Sent EMMI: Heart Attack    . Client will use Assistive Devices as needed and verbalize understanding of device use   On track    [employer] approved meters-One Touch, Freestyle or Precision     . Client will verbalize knowledge of self management of Hypertension as evidences by BP reading of 130/80 or less; or as defined by provider   On track    Plan to check B/P regularly Take B/P medications as ordered Plan to follow a low salt diet  Increase activity as  tolerated    . Decrease inpatient admissions/ readmissions with in the next year   On track    Call [employer] (HTA) Nurse Advise Line [phone] for after hour, weekend or holiday if needed    . HEMOGLOBIN A1C < 7        Last Hemoglobin A1C 7.2% on 08/24/19 Plan to check blood sugars as directed with goals fasting or 1 1/2 hours after eating with goal of 80-130 fasting and 180 or less after meals Plan to follow a low carbohydrate, low salt diet, watch portion sizes and avoid sugar sweetened drinks    . Maintain timely refills of diabetic medication as prescribed within the year .   On track    It is important to get your medications refilled on time    . COMPLETED: Obtain annual  Lipid Profile, LDL-C   On track    Last completed 08/24/19 LDL 100 The goal for LDL is less than 70 mg/dL as you are at high risk for complications Try to avoid saturated fats, trans-fats and eat more fiber Plan to take statin as ordered Goal completed 09/05/19    . Obtain Annual Eye (retinal)  Exam    On track    Plan to have a dilated eye exam every year    . Obtain Annual Foot Exam   On track    Check your skin and feet every day for cuts, bruises,  redness, blisters, or sores. Report to provider any problems with your feet Schedule a foot exam with your health care provider once every year    . Obtain annual screen for micro albuminuria (urine) , nephropathy (kidney problems)   On track    It is important for your doctor to check your urine for protein at least every year    . Obtain Hemoglobin A1C at least 2 times per year   On track    Last completed 08/24/19 It is important to have your Hemoglobin A1C checked every 6 months if you are at goal and every 3 months if you are not at goal    . Visit Primary Care Provider or Endocrinologist at least 2 times per year    On track    Primary care provider 08/24/19 Cardiology 08/24/19 Please schedule your annual wellness visit        Plan:   . Send unsuccessful outreach letter with a copy of individualized care plan to client . Send individualized care plan to provider . Educational Materials EMMI: Advanced Directives, Heart Attack  Client will be outreached by a [employer] ([employer]A) RNCM in 12 months per tier level  Peter Garter RN, [employer], [place] Management Coordinator [place] Management [phone]

## 2019-09-10 ENCOUNTER — Encounter: Payer: Self-pay | Admitting: Physician Assistant

## 2019-09-10 NOTE — Progress Notes (Deleted)
Cardiology Office Note    Date:  09/10/2019   ID:  Richard [name], DOB Jan 10, 1946, MRN [id number]  PCP:  Tammi Sou, MD  Cardiologist:  Larae Grooms, MD  Electrophysiologist:  None   Chief Complaint: f/u cath  History of Present Illness:   Richard [name] is a 74 y.o. male with history of  CAD (LAD stent placement 04/2011, [place] 05/2018 s/p DES to ISR of LAD), HTN, HLD, GERD, normal LVEF, DM with neuropathy, prior esophageal stretching, OSA who presents for post-cath follow-up. He was recently seen in the office with complaints of chest pain concerning for angina. [place] 08/30/19 showed patent stents and nonobstructive disease, no change from 2020 cath. Of note LVEF in 05/2018 as below with normal EF, no significant valve issues.   verapamil for microvascular disease osa tx? LDL/trig too high -> PCP cath   CAD Essential HTN Hyperlipidemia OSA  Labwork independently reviewed: 08/24/19 LDL 100, TSH wnl, trig 341, HDL 45, A1C 7.2, CBC wnl, CMET wnl except glucose (DM)   Past Medical History:  Diagnosis Date  . Coronary artery disease    a. DES to LAD 04/2011 - took Effient/ASA x 1 yr, now on ASA only. b. Cath 10/2012: patent stent, otherwise normal cors. 05/2018 Prox LAD stent 75% restenosed, DES placed.  Needs DAPT x 1 yr, then possible PLAVIX only.  . Diabetes mellitus with complication ([place]) 03/930   A1c 6.9%  . Diabetic peripheral neuropathy ([place])   . GERD (gastroesophageal reflux disease)    HAS HAD ESOPHAGUS STRETCHED SEVERAL TIMES IN THE PAST  . Hyperlipidemia    goal LDL < 70  . Hypertension   . Myocardial infarction ([place])   . OSA on CPAP 10/18/2011   doesn't wear it  . Right knee pain    TORN RIGHT KNEE MEDIAL MENSICAL TEAR  . Shortness of breath    WITH EXERTION  . Syncope    a. 10/2012.    Past Surgical History:  Procedure Laterality Date  . CARDIAC CATHETERIZATION  06/2017   In-stent restenosis cleared out  . CARDIOVASCULAR STRESS TEST     Latter  part of 2019--normal per cardiologist's note from 10/2017  . CERVICAL FUSION  1990 and 1993   X 2   SURGERIES    SLIGHT LIMITATION ROM  . COLONOSCOPY  11/24/2004; 2018   NORMAL.  Recall 2016.  Repeat 2018 normal per pt.  . CORONARY ANGIOPLASTY WITH STENT PLACEMENT    . CORONARY STENT INTERVENTION N/A 05/22/2018   Prox LAD for in stent restenosis.  DAPT x 1 yr.  Procedure: CORONARY STENT INTERVENTION;  Surgeon: Jettie Booze, MD;  Location: [place] [facility];  Service: Cardiovascular;  Laterality: N/A;  . INTRAVASCULAR ULTRASOUND/IVUS N/A 05/22/2018   Procedure: Intravascular Ultrasound/IVUS;  Surgeon: Jettie Booze, MD;  Location: [place] [facility];  Service: Cardiovascular;  Laterality: N/A;  . KNEE ARTHROSCOPY  10/22/2011   Procedure: ARTHROSCOPY KNEE;  Surgeon: Gearlean Alf, MD;  Location: [facility];  Service: Orthopedics;  Laterality: Right;  Right knee scope with debridement  . LAPAROSCOPIC CHOLECYSTECTOMY  2018  . LEFT HEART CATH AND CORONARY ANGIOGRAPHY N/A 05/22/2018   DES placed for in stent restenosis.  EF 55-60%.  Procedure: LEFT HEART CATH AND CORONARY ANGIOGRAPHY;  Surgeon: Jettie Booze, MD;  Location: [place] [facility];  Service: Cardiovascular;  Laterality: N/A;  . LEFT HEART CATH AND CORONARY ANGIOGRAPHY N/A 08/30/2019   Procedure: LEFT HEART CATH AND CORONARY  ANGIOGRAPHY;  Surgeon: Jettie Booze, MD;  Location: [place] [facility];  Service: Cardiovascular;  Laterality: N/A;  . LEFT HEART CATHETERIZATION WITH CORONARY ANGIOGRAM N/A 10/08/2012   Procedure: LEFT HEART CATHETERIZATION WITH CORONARY ANGIOGRAM;  Surgeon: Lorretta Harp, MD;  Location: [facility];  Service: Cardiovascular;  Laterality: N/A;  . NASAL SINUS SURGERY     ONE SINUS SURGERY THRU NOSE AND ANOTHER SINUS SURGERY THRU INCISION ABOVE RT EYE  . TRANSTHORACIC ECHOCARDIOGRAM  05/21/2018   EF 60-65%, grd I DD, no valvular probs.    Current Medications: No outpatient medications have  been marked as taking for the 09/12/19 encounter (Appointment) with [name], PA-C.   ***   Allergies:   Patient has no known allergies.   Social History   Socioeconomic History  . Marital status: Married    Spouse name: [name]  . Number of children: Not on file  . Years of education: Not on file  . Highest education level: Not on file  Occupational History  . Not on file  Tobacco Use  . Smoking status: Never Smoker  . Smokeless tobacco: Former Systems developer    Types: Secondary school teacher  . Vaping Use: Never used  Substance and Sexual Activity  . Alcohol use: No  . Drug use: No  . Sexual activity: Not on file  Other Topics Concern  . Not on file  Social History Narrative   Married, 2 daughters.  2 other daughters deceased.   Educ: HS   Occup: farmer, retired from [employer], Theme park manager at [employer].   No T/A/Ds   Social Determinants of Radio broadcast assistant Strain:   . Difficulty of Paying Living Expenses:   Food Insecurity:   . Worried About Charity fundraiser in the Last Year:   . Arboriculturist in the Last Year:   Transportation Needs:   . Film/video editor (Medical):   Marland Kitchen Lack of Transportation (Non-Medical):   Physical Activity:   . Days of Exercise per Week:   . Minutes of Exercise per Session:   Stress:   . Feeling of Stress :   Social Connections:   . Frequency of Communication with Friends and Family:   . Frequency of Social Gatherings with Friends and Family:   . Attends Religious Services:   . Active Member of Clubs or Organizations:   . Attends Archivist Meetings:   Marland Kitchen Marital Status:      Family History:  The patient's ***family history includes Arthritis in his mother; Cancer in his father.  ROS:   Please see the history of present illness. Otherwise, review of systems is positive for ***.  All other systems are reviewed and otherwise negative.    EKGs/Labs/Other Studies Reviewed:    Studies reviewed are outlined and  summarized above. Reports included below if pertinent.  LHC 08/30/19  Dist LM lesion is 20% stenosed.  Mid LAD lesion is 25% stenosed.  Previously placed Prox LAD drug eluting stent is widely patent.  The left ventricular systolic function is normal.  LV end diastolic pressure is normal.  The left ventricular ejection fraction is 55-65% by visual estimate.  There is no aortic valve stenosis.   Patent stents.  Nonobstructive disease.  No change from end of 2020 cath.  Continue medical therapy with nitrates.  If symptoms persist, could also consider verapamil as an antianginal to treat potential microvascular disease.    2D echo 05/2018  1.  The left ventricle has normal systolic function with an ejection  fraction of 60-65%. The cavity size was normal. Left ventricular diastolic  Doppler parameters are consistent with impaired relaxation. No evidence of  left ventricular regional wall  motion abnormalities.  2. The right ventricle has normal systolic function. The cavity was  normal. There is no increase in right ventricular wall thickness. Right  ventricular systolic pressure could not be assessed.  3. No hemodynamically significant valve disease.       EKG:  EKG is ordered today, personally reviewed, demonstrating ***  Recent Labs: 08/24/2019: ALT 15; BUN 9; Creatinine, Ser 0.88; Hemoglobin 14.4; Platelets 165.0; Potassium 4.6; Sodium 141; TSH 1.54  Recent Lipid Panel    Component Value Date/Time   CHOL 194 08/24/2019 1040   CHOL 110 08/18/2018 1010   TRIG 341.0 (H) 08/24/2019 1040   HDL 45.30 08/24/2019 1040   HDL 39 (L) 08/18/2018 1010   CHOLHDL 4 08/24/2019 1040   VLDL 68.2 (H) 08/24/2019 1040   LDLCALC 27 08/18/2018 1010   LDLDIRECT 100.0 08/24/2019 1040    PHYSICAL EXAM:    VS:  There were no vitals taken for this visit.  BMI: There is no height or weight on file to calculate BMI.  GEN: Well nourished, well developed, in no acute distress HEENT:  normocephalic, atraumatic Neck: no JVD, carotid bruits, or masses Cardiac: ***RRR; no murmurs, rubs, or gallops, no edema  Respiratory:  clear to auscultation bilaterally, normal work of breathing GI: soft, nontender, nondistended, + BS MS: no deformity or atrophy Skin: warm and dry, no rash Neuro:  Alert and Oriented x 3, Strength and sensation are intact, follows commands Psych: euthymic mood, full affect  Wt Readings from Last 3 Encounters:  08/30/19 260 lb (117.9 kg)  08/24/19 259 lb 12.8 oz (117.8 kg)  08/24/19 260 lb 12.8 oz (118.3 kg)     ASSESSMENT & PLAN:   1. ***  Disposition: F/u with ***   Medication Adjustments/Labs and Tests Ordered: Current medicines are reviewed at length with the patient today.  Concerns regarding medicines are outlined above. Medication changes, Labs and Tests ordered today are summarized above and listed in the Patient Instructions accessible in Encounters.   Signed, [name], PA-C  09/10/2019 10:25 AM    [address], [place], [place]  [address] Phone: [phone]; Fax: [phone]

## 2019-09-12 ENCOUNTER — Ambulatory Visit: Payer: HMO | Admitting: Physician Assistant

## 2019-09-14 ENCOUNTER — Other Ambulatory Visit: Payer: Self-pay | Admitting: Interventional Cardiology

## 2019-09-20 ENCOUNTER — Telehealth: Payer: Self-pay | Admitting: Family Medicine

## 2019-09-20 ENCOUNTER — Encounter: Payer: Self-pay | Admitting: Family Medicine

## 2019-09-20 NOTE — Telephone Encounter (Signed)
Pls contact pt and ask him to make a f/u appt with either me or his cardiologist--needs f/u on his heart disease (he canceled his appt with cardiology 09/12/19). Appt in the next 1 month or so.

## 2019-09-20 NOTE — Telephone Encounter (Signed)
LM for pt to returncall

## 2019-09-21 NOTE — Telephone Encounter (Signed)
LM for pt to returncall

## 2019-09-21 NOTE — Telephone Encounter (Signed)
Pt has been scheduled.  °

## 2019-10-03 ENCOUNTER — Other Ambulatory Visit: Payer: Self-pay

## 2019-10-03 ENCOUNTER — Encounter: Payer: Self-pay | Admitting: Family Medicine

## 2019-10-03 ENCOUNTER — Ambulatory Visit (INDEPENDENT_AMBULATORY_CARE_PROVIDER_SITE_OTHER): Payer: HMO | Admitting: Family Medicine

## 2019-10-03 VITALS — BP 137/77 | HR 54 | Temp 97.7°F | Resp 16 | Ht 68.0 in | Wt 257.6 lb

## 2019-10-03 DIAGNOSIS — F411 Generalized anxiety disorder: Secondary | ICD-10-CM | POA: Diagnosis not present

## 2019-10-03 DIAGNOSIS — I2510 Atherosclerotic heart disease of native coronary artery without angina pectoris: Secondary | ICD-10-CM | POA: Diagnosis not present

## 2019-10-03 DIAGNOSIS — E1149 Type 2 diabetes mellitus with other diabetic neurological complication: Secondary | ICD-10-CM

## 2019-10-03 DIAGNOSIS — I10 Essential (primary) hypertension: Secondary | ICD-10-CM | POA: Diagnosis not present

## 2019-10-03 DIAGNOSIS — G4700 Insomnia, unspecified: Secondary | ICD-10-CM | POA: Diagnosis not present

## 2019-10-03 DIAGNOSIS — E7800 Pure hypercholesterolemia, unspecified: Secondary | ICD-10-CM

## 2019-10-03 LAB — BASIC METABOLIC PANEL
BUN: 10 mg/dL (ref 6–23)
CO2: 26 mEq/L (ref 19–32)
Calcium: 9.2 mg/dL (ref 8.4–10.5)
Chloride: 109 mEq/L (ref 96–112)
Creatinine, Ser: 0.94 mg/dL (ref 0.40–1.50)
GFR: 78.74 mL/min (ref >60.00)
Glucose, Bld: 120 mg/dL — ABNORMAL HIGH (ref 70–99)
Potassium: 4 mEq/L (ref 3.5–5.1)
Sodium: 142 mEq/L (ref 135–145)

## 2019-10-03 MED ORDER — FENOFIBRATE 145 MG PO TABS: 145.0000 mg | ORAL_TABLET | Freq: Every day | ORAL | 3 refills | Status: DC

## 2019-10-03 MED ORDER — GABAPENTIN 300 MG PO CAPS: 300.0000 mg | ORAL_CAPSULE | Freq: Two times a day (BID) | ORAL | 3 refills | Status: DC

## 2019-10-03 MED ORDER — CLOPIDOGREL BISULFATE 75 MG PO TABS: 75.0000 mg | ORAL_TABLET | Freq: Every day | ORAL | 3 refills | Status: DC

## 2019-10-03 MED ORDER — LISINOPRIL 40 MG PO TABS: 40.0000 mg | ORAL_TABLET | Freq: Every day | ORAL | 1 refills | Status: DC

## 2019-10-03 MED ORDER — ESCITALOPRAM OXALATE 10 MG PO TABS: 10.0000 mg | ORAL_TABLET | Freq: Every day | ORAL | 1 refills | Status: DC

## 2019-10-03 NOTE — Progress Notes (Signed)
OFFICE VISIT  10/03/2019   CC:  Chief Complaint  Patient presents with  . Follow-up    RCI, pt is not fasting   HPI:    Patient is a 74 y.o. Caucasian male who presents accompanied by his wife for 6 wk f/u CAD, DM with DPN, HTN, HLD. A/P as of last visit: "1) CAD with angina vs [place].  NOT having any symptoms at this current time. Symptoms are sometimes at rest, mild EKG changes compared to 05/2018. I talked to cardiology "doc of the day", Dr. Burt Knack. He felt like the EKG changes represented a bit of progression of conduction disturbance, not ischemia or suggestive of past MI. He agreed that pt should be worked in to their clinic but nothing urgent was needed. I did RF pt's nitroglycerin tabs today. CBC today.  2) uncontrolled HTN: increase toprol xl to 100mg  qd. TSH and CBC today.  3) DM: noncompliant with diet/exercise. HbA1c, lytes/cr, and urine microalb/cr today.  4) HLD: tolerating statin and fibrate. FLP and hepatic panel today."  INTERIM HX: Cath 08/30/19->patent stent, mild distal LAD dz not requiring intervention, LFEF nl, EDP nl->maximize antianginal meds->imdur 1/2 of 30mg  tab qd was started.  Last visit his a1c was up to 7.2% so I increased his metformin to 500mg  bid.  He feels "about the same".  Fatigued chronically. Gardens 1 row of sweet potatoes and then feels breathless and body fatigued/hurts all over, some mild CP and heaviness. Climbing 1 flight of stairs causes same sx's.  Sits 30 min max and feels back to normal. He does not take sl ntg when this happens.  Has "quick fuse", easily irritated/angered.  Esp the last 3 mo. Pandemic crisis has affect him significantly. Sleeps 9pm, up at 1-2 am and can't sleep, worse  Only taking metformin once a day still.  ROS: no fevers, no wheezing, no cough, no dizziness, no HAs, no rashes, no melena/hematochezia.  No polyuria or polydipsia.  No myalgias or arthralgias.  No focal weakness, paresthesias, or tremors.   No acute vision or hearing abnormalities. No n/v/d or abd pain.  No palpitations.    Past Medical History:  Diagnosis Date  . Coronary artery disease    a. DES to LAD 04/2011 - took Effient/ASA x 1 yr, now on ASA only. b. Cath 10/2012: patent stent, otherwise normal cors. c. 05/2018 Prox LAD stent 75% restenosed, DES placed.  08/30/19 cath: patent stent, mild distal LAD dz not requiring intervention, LVEF nl, EDP normal: maximize antianginal meds.  . Diabetes mellitus with complication ([place]) 84/6962   A1c 6.9%  . Diabetic peripheral neuropathy ([place])   . GERD (gastroesophageal reflux disease)    HAS HAD ESOPHAGUS STRETCHED SEVERAL TIMES IN THE PAST  . Hyperlipidemia    goal LDL < 70  . Hypertension   . Myocardial infarction ([place])   . OSA on CPAP 10/18/2011   doesn't wear it  . Right knee pain    TORN RIGHT KNEE MEDIAL MENSICAL TEAR  . Syncope    a. 10/2012.    Past Surgical History:  Procedure Laterality Date  . CARDIAC CATHETERIZATION  06/2017   In-stent restenosis cleared out  . CARDIOVASCULAR STRESS TEST     Latter part of 2019--normal per cardiologist's note from 10/2017  . CERVICAL FUSION  1990 and 1993   X 2   SURGERIES    SLIGHT LIMITATION ROM  . COLONOSCOPY  11/24/2004; 2018   NORMAL.  Recall 2016.  Repeat 2018 normal per pt.  Marland Kitchen  CORONARY ANGIOPLASTY WITH STENT PLACEMENT    . CORONARY STENT INTERVENTION N/A 05/22/2018   Prox LAD for in stent restenosis.  DAPT x 1 yr.  Procedure: CORONARY STENT INTERVENTION;  Surgeon: Jettie Booze, MD;  Location: [place] [facility];  Service: Cardiovascular;  Laterality: N/A;  . INTRAVASCULAR ULTRASOUND/IVUS N/A 05/22/2018   Procedure: Intravascular Ultrasound/IVUS;  Surgeon: Jettie Booze, MD;  Location: [place] [facility];  Service: Cardiovascular;  Laterality: N/A;  . KNEE ARTHROSCOPY  10/22/2011   Procedure: ARTHROSCOPY KNEE;  Surgeon: Gearlean Alf, MD;  Location: [facility];  Service: Orthopedics;  Laterality: Right;  Right knee  scope with debridement  . LAPAROSCOPIC CHOLECYSTECTOMY  2018  . LEFT HEART CATH AND CORONARY ANGIOGRAPHY N/A 05/22/2018   DES placed for in stent restenosis.  EF 55-60%.  Procedure: LEFT HEART CATH AND CORONARY ANGIOGRAPHY;  Surgeon: Jettie Booze, MD;  Location: [place] [facility];  Service: Cardiovascular;  Laterality: N/A;  . LEFT HEART CATH AND CORONARY ANGIOGRAPHY N/A 08/30/2019   No change from 2020 cath->patent LAD stent, mild distal LAD dz.  No intervention required. Nitrates + possible future CCB antianginal for poss microvasc dz. Procedure: LEFT HEART CATH AND CORONARY ANGIOGRAPHY;  Surgeon: Jettie Booze, MD;  Location: [place] [facility];  Service: Cardiovascular;  Laterality: N/A;  . LEFT HEART CATHETERIZATION WITH CORONARY ANGIOGRAM N/A 10/08/2012   Procedure: LEFT HEART CATHETERIZATION WITH CORONARY ANGIOGRAM;  Surgeon: Lorretta Harp, MD;  Location: [facility];  Service: Cardiovascular;  Laterality: N/A;  . NASAL SINUS SURGERY     ONE SINUS SURGERY THRU NOSE AND ANOTHER SINUS SURGERY THRU INCISION ABOVE RT EYE  . TRANSTHORACIC ECHOCARDIOGRAM  05/21/2018   EF 60-65%, grd I DD, no valvular probs.    Outpatient Medications Prior to Visit  Medication Sig Dispense Refill  . acetaminophen (TYLENOL) 500 MG tablet Take 1,500 mg by mouth every 6 (six) hours as needed for mild pain or moderate pain (Sinus).    Marland Kitchen Apoaequorin (PREVAGEN) 10 MG CAPS Take 10 mg by mouth daily.    Marland Kitchen aspirin EC 81 MG EC tablet Take 1 tablet (81 mg total) by mouth daily.    Marland Kitchen atorvastatin (LIPITOR) 80 MG tablet TAKE 1 TABLET (80 MG TOTAL) BY MOUTH DAILY AT 6 PM. (Patient taking differently: Take 80 mg by mouth daily. ) 90 tablet 1  . cholecalciferol (VITAMIN D) 1000 UNITS tablet Take 2,000 Units by mouth every evening.     . clopidogrel (PLAVIX) 75 MG tablet Take 1 tablet (75 mg total) by mouth daily with breakfast. (Patient taking differently: Take 75 mg by mouth daily. ) 90 tablet 3  . fenofibrate  (TRICOR) 145 MG tablet Take 1 tablet (145 mg total) by mouth daily. (Patient taking differently: Take 145 mg by mouth at bedtime. ) 90 tablet 3  . furosemide (LASIX) 40 MG tablet Take 40 mg by mouth See admin instructions. Take every  day except: Sunday    . isosorbide mononitrate (IMDUR) 30 MG 24 hr tablet Take 0.5 tablets (15 mg total) by mouth daily. 45 tablet 3  . metFORMIN (GLUCOPHAGE) 500 MG tablet Take 1 tablet (500 mg total) by mouth 2 (two) times daily with a meal.    . metoprolol succinate (TOPROL-XL) 100 MG 24 hr tablet Take 1 tablet (100 mg total) by mouth in the morning and at bedtime. Take with or immediately following a meal. (Patient taking differently: Take 100 mg by mouth daily. Take with  or immediately following a meal.) 60 tablet 3  . pantoprazole (PROTONIX) 40 MG tablet Take 40 mg by mouth every evening.     . vitamin B-12 (CYANOCOBALAMIN) 1000 MCG tablet Take 1,000 mcg by mouth every other day.    . gabapentin (NEURONTIN) 300 MG capsule Take 1 capsule (300 mg total) by mouth 2 (two) times daily. 30 capsule 0  . lisinopril (ZESTRIL) 40 MG tablet Take 1 tablet (40 mg total) by mouth daily. Please keep upcoming appt in July before anymore refills. Thank you 30 tablet 0  . nitroGLYCERIN (NITROSTAT) 0.4 MG SL tablet PLACE 1 TABLET UNDER THE TONGUE EVERY 5 MINUTES X 3 DOSES AS NEEDED FOR CHEST PAIN. (Patient not taking: Reported on 10/03/2019) 25 tablet 6  . phenylephrine (SINUS RELIEF EXTRA STRENGTH) 1 % nasal spray Place 1 drop into both nostrils every 6 (six) hours as needed for congestion. (Patient not taking: Reported on 10/03/2019)     No facility-administered medications prior to visit.    No Known Allergies  ROS As per HPI  PE: Vitals with BMI 10/03/2019 08/30/2019 08/30/2019  Height 5\' 8"  - -  Weight 257 lbs 10 oz - -  BMI 40.98 - -  Systolic 119 147 829  Diastolic 77 74 77  Pulse 54 77 71  O2 sat on RA today is 95%  Gen: Alert, well appearing.  Patient is oriented  to person, place, time, and situation. AFFECT: pleasant, lucid thought and speech. CV: RRR, no m/r/g.   LUNGS: CTA bilat, nonlabored resps, good aeration in all lung fields. EXT: no clubbing or cyanosis.  no edema.    LABS:  Lab Results  Component Value Date   TSH 1.54 08/24/2019   Lab Results  Component Value Date   WBC 7.3 08/24/2019   HGB 14.4 08/24/2019   HCT 42.8 08/24/2019   MCV 96.7 08/24/2019   PLT 165.0 08/24/2019   Lab Results  Component Value Date   CREATININE 0.88 08/24/2019   BUN 9 08/24/2019   NA 141 08/24/2019   K 4.6 08/24/2019   CL 106 08/24/2019   CO2 30 08/24/2019   Lab Results  Component Value Date   ALT 15 08/24/2019   AST 15 08/24/2019   ALKPHOS 60 08/24/2019   BILITOT 0.5 08/24/2019   Lab Results  Component Value Date   CHOL 194 08/24/2019   Lab Results  Component Value Date   HDL 45.30 08/24/2019   Lab Results  Component Value Date   LDLCALC 27 08/18/2018   Lab Results  Component Value Date   TRIG 341.0 (H) 08/24/2019   Lab Results  Component Value Date   CHOLHDL 4 08/24/2019   Lab Results  Component Value Date   HGBA1C 7.2 (H) 08/24/2019   IMPRESSION AND PLAN:  1) CAD, recent cath reassuring regarding stent patency, but likelihood of small vessel dz high.  Has mild intensity stable angina, not requiring any sl NTG. Continue ASA and plavix, BB, ACE-I, imdur, statin, fibrate. Continue approp cardiology f/u.  2) DM: noncompliant with recent recommendation to inc metformin to 500mg  BID. Next a1c due approx 11/24/19.  3) HTN: control good now. RF'd lisin 40mg  qd.  Continue toprol xl 100 mg qd. BMET today.  4) GAD/irritability.  Start lexapro 10mg  qd and hopefully this will also be beneficial for his insomnia.  5) Postprandial diarrhea: ever since having GB removed. 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 NO meds at this time. Instructions: Start fiber supplement called metamucil every day.  Take your metformin  500 mg tab TWICE per day.  An After Visit Summary was printed and given to the patient.  FOLLOW UP: Return in about 4 weeks (around 10/31/2019) for f/u irrit/anx (lexapro).  Signed:  Crissie Sickles, MD           10/03/2019

## 2019-10-03 NOTE — Telephone Encounter (Signed)
Patient requests Rx refill fenofibrate (TRICOR) 145 MG tablet, clopidogrel 75 MG, sent to [facility]

## 2019-10-03 NOTE — Patient Instructions (Addendum)
Start fiber supplement called metamucil every day. Take your metformin 500 mg tab TWICE per day.

## 2019-10-03 NOTE — Telephone Encounter (Signed)
RF request for fenofibrate LOV:10/03/19 Next ov: 10/31/19 Last written:08/24/18(90,3)  RF request for plavix 75mg  LOV:10/03/19 Next ov: 10/31/19 Last written:08/24/18(90,3)  Neither medication were originally prescribed by you. Currently pending, please advise, thanks.

## 2019-10-13 IMAGING — CR CHEST - 2 VIEW
2 series · 2 of 2 positions shown · non-contrast
Comparison: 10/08/2012

CLINICAL DATA: 72-year-old male with acute chest pain

EXAM:
CHEST - 2 VIEW

[w chest pa]
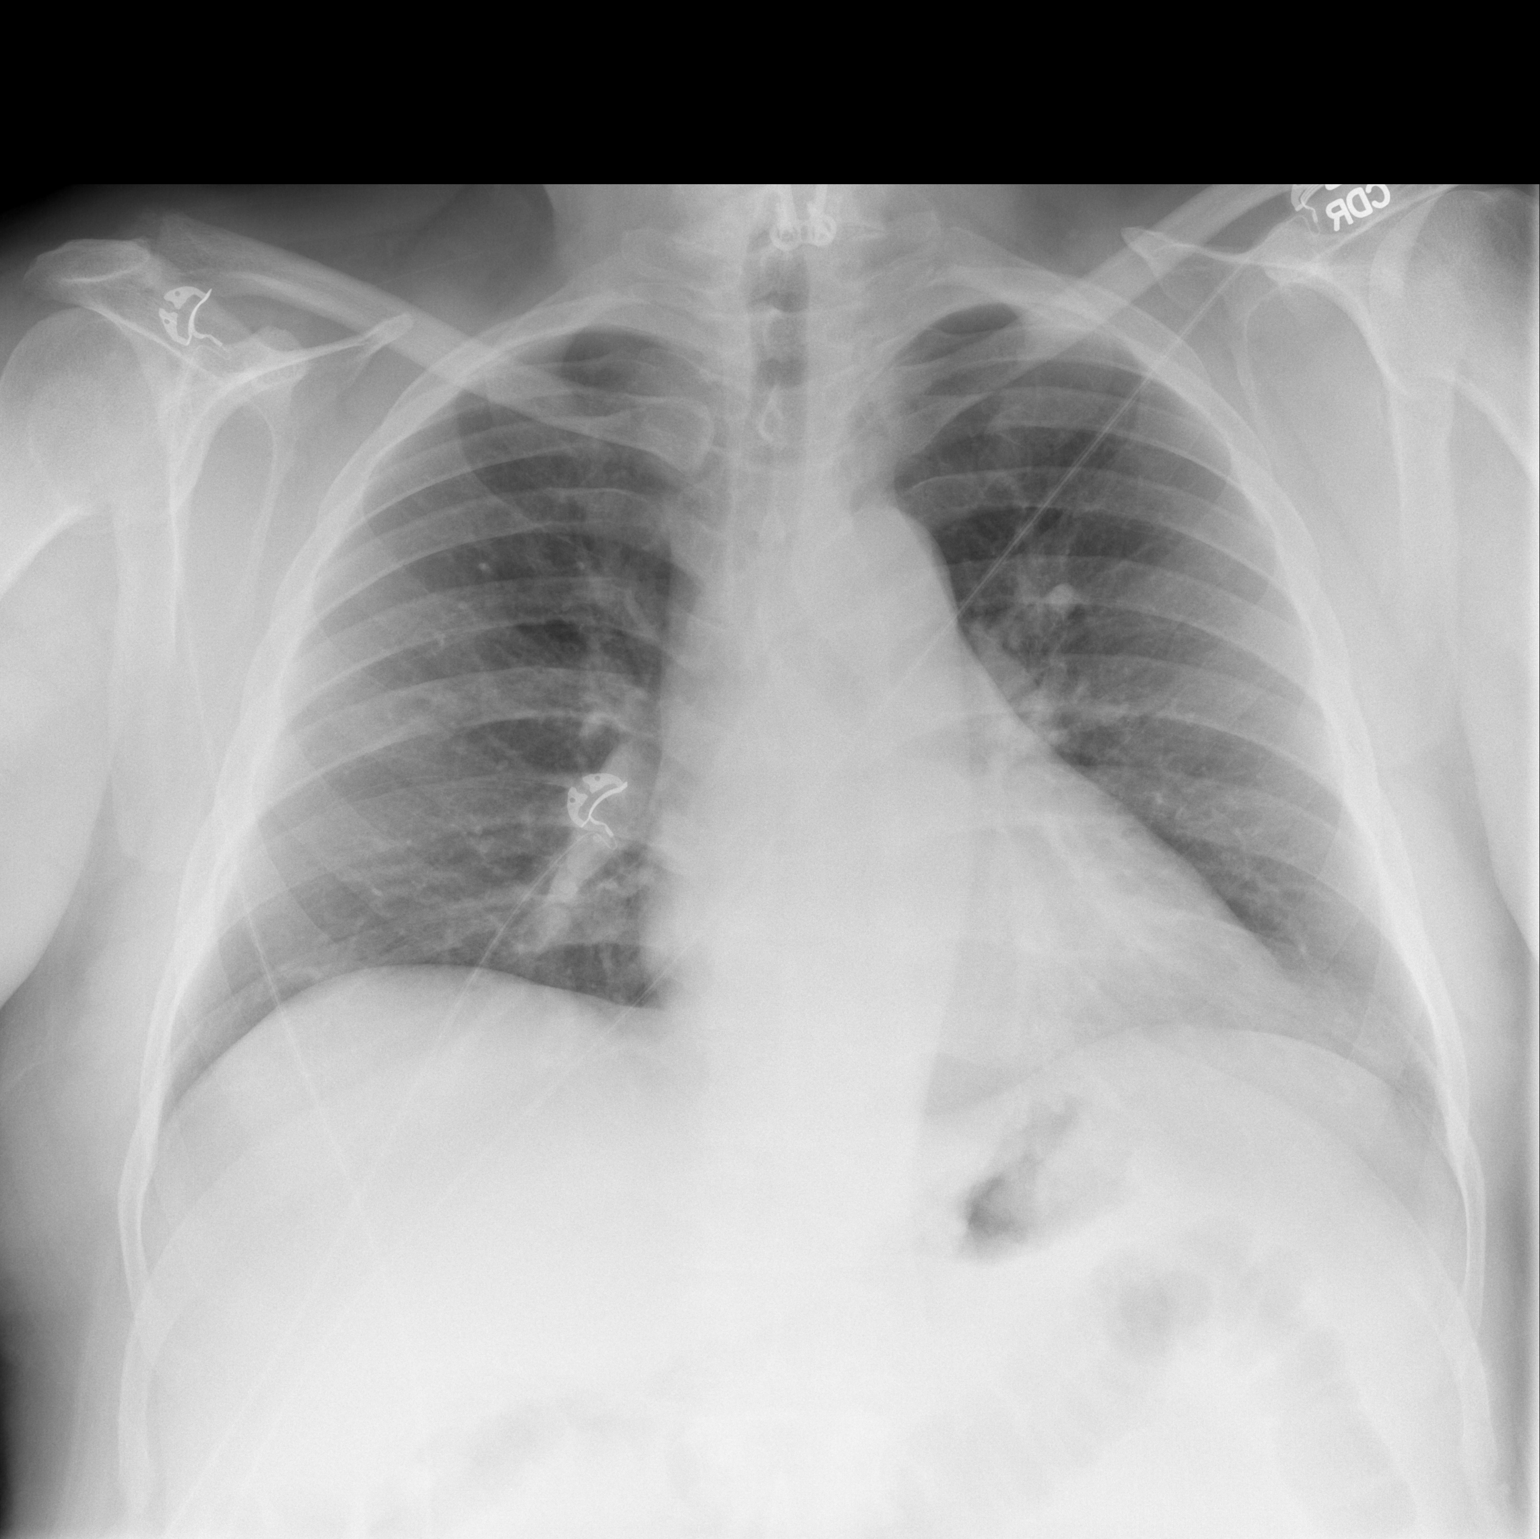

[w chest lat]
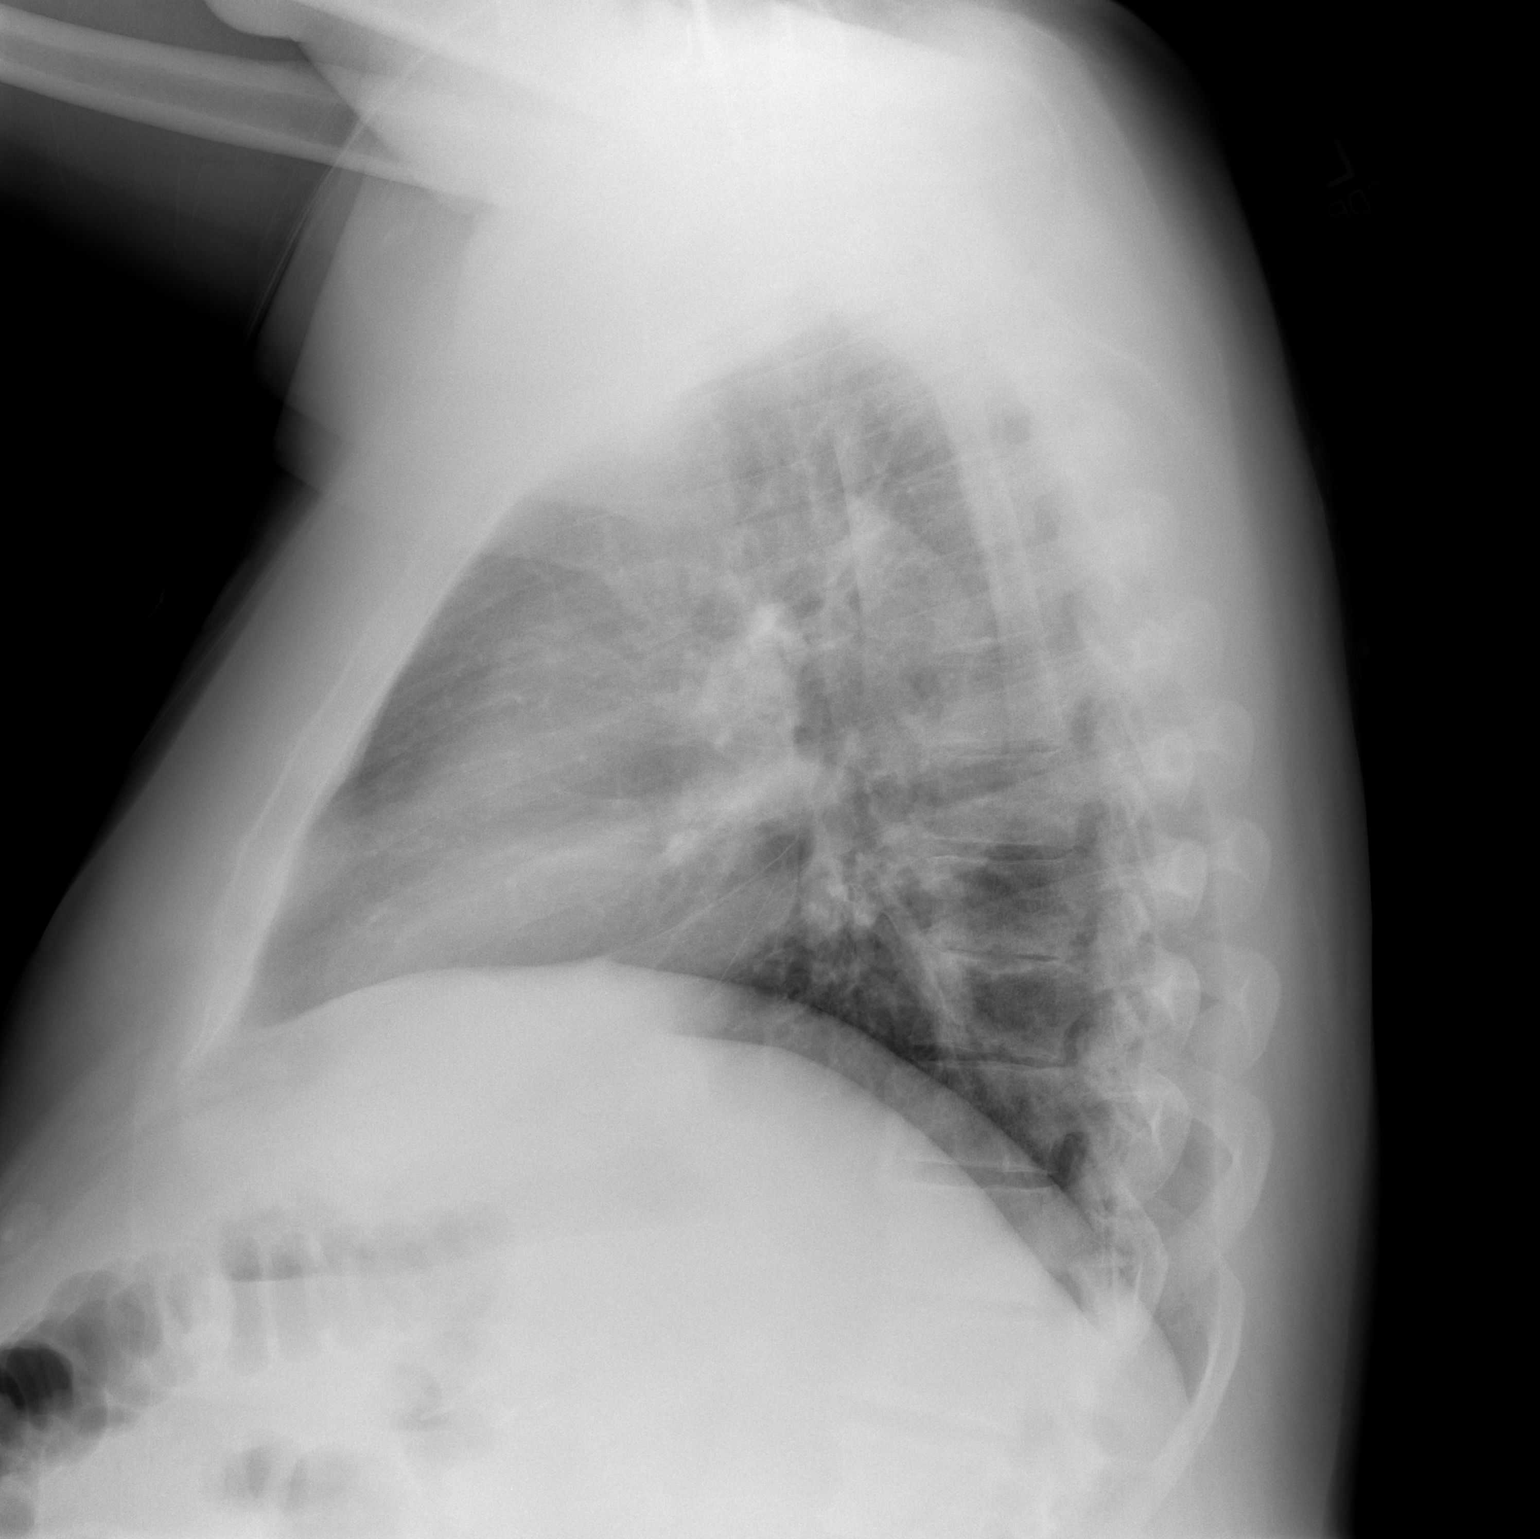

[2 of 2 positions shown; findings below may reference images not displayed]

FINDINGS: The cardiomediastinal silhouette is unremarkable.

There is no evidence of focal airspace disease, pulmonary edema,
suspicious pulmonary nodule/mass, pleural effusion, or pneumothorax.

No acute bony abnormalities are identified. Cervical fusion changes
again noted.
IMPRESSION: No active cardiopulmonary disease.

## 2019-10-25 ENCOUNTER — Other Ambulatory Visit: Payer: Self-pay | Admitting: Family Medicine

## 2019-10-25 NOTE — Telephone Encounter (Signed)
RF request for escitalopram 10 mg LOV: 10/03/19  Next ov: 10/31/19 Last written: 10/03/19 (30,1)  Please advise Pharmacy requesting 90 day supply

## 2019-10-26 NOTE — Telephone Encounter (Signed)
It does not make any sense to get a 90d supply of his lexapro now b/c we may increase his dose at his f/u next week. We can always do 90d supply at that time if he stays on 10mg  dosing.

## 2019-10-31 ENCOUNTER — Encounter: Payer: Self-pay | Admitting: Family Medicine

## 2019-10-31 ENCOUNTER — Ambulatory Visit (INDEPENDENT_AMBULATORY_CARE_PROVIDER_SITE_OTHER): Payer: HMO | Admitting: Family Medicine

## 2019-10-31 ENCOUNTER — Other Ambulatory Visit: Payer: Self-pay

## 2019-10-31 VITALS — BP 127/78 | HR 58 | Temp 98.0°F | Resp 16 | Ht 68.0 in | Wt 251.4 lb

## 2019-10-31 DIAGNOSIS — F419 Anxiety disorder, unspecified: Secondary | ICD-10-CM | POA: Diagnosis not present

## 2019-10-31 DIAGNOSIS — F439 Reaction to severe stress, unspecified: Secondary | ICD-10-CM

## 2019-10-31 DIAGNOSIS — R454 Irritability and anger: Secondary | ICD-10-CM

## 2019-10-31 DIAGNOSIS — K580 Irritable bowel syndrome with diarrhea: Secondary | ICD-10-CM | POA: Diagnosis not present

## 2019-10-31 MED ORDER — DICYCLOMINE HCL 10 MG PO CAPS: 10.0000 mg | ORAL_CAPSULE | Freq: Three times a day (TID) | ORAL | 1 refills | Status: DC

## 2019-10-31 NOTE — Progress Notes (Signed)
OFFICE VISIT  10/31/2019   CC:  Chief Complaint  Patient presents with  . Follow-up    stress/anxiety   HPI:    Patient is a 74 y.o.  male who presents for 1 mo f/u stress/anxiety. A/P as of last visit: "1) CAD, recent cath reassuring regarding stent patency, but likelihood of small vessel dz high.  Has mild intensity stable angina, not requiring any sl NTG. Continue ASA and plavix, BB, ACE-I, imdur, statin, fibrate. Continue approp cardiology f/u.  2) DM: noncompliant with recent recommendation to inc metformin to 500mg  BID. Next a1c due approx 11/24/19.  3) HTN: control good now. RF'd lisin 40mg  qd.  Continue toprol xl 100 mg qd. BMET today.  4) GAD/irritability.  Start lexapro 10mg  qd and hopefully this will also be beneficial for his insomnia.  5) Postprandial diarrhea: ever since having GB removed. 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 NO meds at this time. Instructions: Start fiber supplement called metamucil every day. Take your metformin 500 mg tab TWICE per day."  INTERIM HX: He feels better: less irritability, better attitude.  No side effects.  Still having loose postprandial urgent BMs fairly regularly. No abd cramping.  Some "stomach growling" and occ feeling of fullness/gas. All this started after getting his GB out in 2018.   Taking metamucil has helped some of his BMs be more formed.  No CP that he has had to take NTG for. Has felt a little improvement in overall energy level. Tries to hydrate well with water.  Past Medical History:  Diagnosis Date  . Coronary artery disease    a. DES to LAD 04/2011 - took Effient/ASA x 1 yr, now on ASA only. b. Cath 10/2012: patent stent, otherwise normal cors. c. 05/2018 Prox LAD stent 75% restenosed, DES placed.  08/30/19 cath: patent stent, mild distal LAD dz not requiring intervention, LVEF nl, EDP normal: maximize antianginal meds.  . Diabetes mellitus with complication ([place]) 01/9146   A1c 6.9%  .  Diabetic peripheral neuropathy ([place])   . GERD (gastroesophageal reflux disease)    HAS HAD ESOPHAGUS STRETCHED SEVERAL TIMES IN THE PAST  . Hyperlipidemia    goal LDL < 70  . Hypertension   . Myocardial infarction ([place])   . OSA on CPAP 10/18/2011   doesn't wear it  . Right knee pain    TORN RIGHT KNEE MEDIAL MENSICAL TEAR  . Syncope    a. 10/2012.    Past Surgical History:  Procedure Laterality Date  . CARDIAC CATHETERIZATION  06/2017   In-stent restenosis cleared out  . CARDIOVASCULAR STRESS TEST     Latter part of 2019--normal per cardiologist's note from 10/2017  . CERVICAL FUSION  1990 and 1993   X 2   SURGERIES    SLIGHT LIMITATION ROM  . COLONOSCOPY  11/24/2004; 2018   NORMAL.  Recall 2016.  Repeat 2018 normal per pt.  . CORONARY ANGIOPLASTY WITH STENT PLACEMENT    . CORONARY STENT INTERVENTION N/A 05/22/2018   Prox LAD for in stent restenosis.  DAPT x 1 yr.  Procedure: CORONARY STENT INTERVENTION;  Surgeon: Jettie Booze, MD;  Location: [facility];  Service: Cardiovascular;  Laterality: N/A;  . INTRAVASCULAR ULTRASOUND/IVUS N/A 05/22/2018   Procedure: Intravascular Ultrasound/IVUS;  Surgeon: Jettie Booze, MD;  Location: [facility];  Service: Cardiovascular;  Laterality: N/A;  . KNEE ARTHROSCOPY  10/22/2011   Procedure: ARTHROSCOPY KNEE;  Surgeon: Gearlean Alf, MD;  Location: [facility];  Service:  Orthopedics;  Laterality: Right;  Right knee scope with debridement  . LAPAROSCOPIC CHOLECYSTECTOMY  2018  . LEFT HEART CATH AND CORONARY ANGIOGRAPHY N/A 05/22/2018   DES placed for in stent restenosis.  EF 55-60%.  Procedure: LEFT HEART CATH AND CORONARY ANGIOGRAPHY;  Surgeon: Jettie Booze, MD;  Location: [facility];  Service: Cardiovascular;  Laterality: N/A;  . LEFT HEART CATH AND CORONARY ANGIOGRAPHY N/A 08/30/2019   No change from 2020 cath->patent LAD stent, mild distal LAD dz.  No intervention required. Nitrates + possible future CCB  antianginal for poss microvasc dz. Procedure: LEFT HEART CATH AND CORONARY ANGIOGRAPHY;  Surgeon: Jettie Booze, MD;  Location: [facility];  Service: Cardiovascular;  Laterality: N/A;  . LEFT HEART CATHETERIZATION WITH CORONARY ANGIOGRAM N/A 10/08/2012   Procedure: LEFT HEART CATHETERIZATION WITH CORONARY ANGIOGRAM;  Surgeon: Lorretta Harp, MD;  Location: [facility];  Service: Cardiovascular;  Laterality: N/A;  . NASAL SINUS SURGERY     ONE SINUS SURGERY THRU NOSE AND ANOTHER SINUS SURGERY THRU INCISION ABOVE RT EYE  . TRANSTHORACIC ECHOCARDIOGRAM  05/21/2018   EF 60-65%, grd I DD, no valvular probs.    Outpatient Medications Prior to Visit  Medication Sig Dispense Refill  . acetaminophen (TYLENOL) 500 MG tablet Take 1,500 mg by mouth every 6 (six) hours as needed for mild pain or moderate pain (Sinus).    Marland Kitchen Apoaequorin (PREVAGEN) 10 MG CAPS Take 10 mg by mouth daily.    Marland Kitchen aspirin EC 81 MG EC tablet Take 1 tablet (81 mg total) by mouth daily.    Marland Kitchen atorvastatin (LIPITOR) 80 MG tablet TAKE 1 TABLET (80 MG TOTAL) BY MOUTH DAILY AT 6 PM. (Patient taking differently: Take 80 mg by mouth daily. ) 90 tablet 1  . cholecalciferol (VITAMIN D) 1000 UNITS tablet Take 2,000 Units by mouth every evening.     . clopidogrel (PLAVIX) 75 MG tablet Take 1 tablet (75 mg total) by mouth daily with breakfast. 90 tablet 3  . escitalopram (LEXAPRO) 10 MG tablet Take 1 tablet (10 mg total) by mouth daily. 30 tablet 1  . fenofibrate (TRICOR) 145 MG tablet Take 1 tablet (145 mg total) by mouth daily. 90 tablet 3  . furosemide (LASIX) 40 MG tablet Take 40 mg by mouth See admin instructions. Take every  day except: Sunday    . gabapentin (NEURONTIN) 300 MG capsule Take 1 capsule (300 mg total) by mouth 2 (two) times daily. 180 capsule 3  . isosorbide mononitrate (IMDUR) 30 MG 24 hr tablet Take 0.5 tablets (15 mg total) by mouth daily. 45 tablet 3  . lisinopril (ZESTRIL) 40 MG tablet Take 1 tablet (40 mg  total) by mouth daily. Please keep upcoming appt in July before anymore refills. Thank you 90 tablet 1  . metFORMIN (GLUCOPHAGE) 500 MG tablet Take 1 tablet (500 mg total) by mouth 2 (two) times daily with a meal.    . metoprolol succinate (TOPROL-XL) 100 MG 24 hr tablet Take 1 tablet (100 mg total) by mouth in the morning and at bedtime. Take with or immediately following a meal. (Patient taking differently: Take 100 mg by mouth daily. Take with or immediately following a meal.) 60 tablet 3  . pantoprazole (PROTONIX) 40 MG tablet Take 40 mg by mouth every evening.     . phenylephrine (SINUS RELIEF EXTRA STRENGTH) 1 % nasal spray Place 1 drop into both nostrils every 6 (six) hours as needed for congestion.     Marland Kitchen  vitamin B-12 (CYANOCOBALAMIN) 1000 MCG tablet Take 1,000 mcg by mouth every other day.    . nitroGLYCERIN (NITROSTAT) 0.4 MG SL tablet PLACE 1 TABLET UNDER THE TONGUE EVERY 5 MINUTES X 3 DOSES AS NEEDED FOR CHEST PAIN. (Patient not taking: Reported on 10/31/2019) 25 tablet 6   No facility-administered medications prior to visit.    No Known Allergies  ROS As per HPI  PE: Vitals with BMI 10/31/2019 10/03/2019 08/30/2019  Height 5\' 8"  5\' 8"  -  Weight 251 lbs 6 oz 257 lbs 10 oz -  BMI 79.43 27.61 -  Systolic 470 929 574  Diastolic 78 77 74  Pulse 58 54 77  O2 sat 95% on RA today  Gen: Alert, well appearing.  Patient is oriented to person, place, time, and situation. AFFECT: pleasant, lucid thought and speech. No further exam today.  LABS:    Chemistry      Component Value Date/Time   NA 142 10/03/2019 1058   NA 141 08/18/2018 1010   K 4.0 10/03/2019 1058   CL 109 10/03/2019 1058   CO2 26 10/03/2019 1058   BUN 10 10/03/2019 1058   BUN 15 08/18/2018 1010   CREATININE 0.94 10/03/2019 1058      Component Value Date/Time   CALCIUM 9.2 10/03/2019 1058   ALKPHOS 60 08/24/2019 1040   AST 15 08/24/2019 1040   ALT 15 08/24/2019 1040   BILITOT 0.5 08/24/2019 1040   BILITOT 0.6  08/18/2018 1010     Lab Results  Component Value Date   HGBA1C 7.2 (H) 08/24/2019   IMPRESSION AND PLAN:  1) Anxiety/anger control probs/stress rxn: Improved on lexapro 10mg  qd for the last month. Continue at current dose.  2) IBS w/diarrhea-->start trial of bentyl 10mg  qAC and qhs. Continue daily metamucil.  An After Visit Summary was printed and given to the patient.  FOLLOW UP: Return in about 4 weeks (around 11/28/2019) for f/u DM/IBS/stress/anxiety.  Signed:  Crissie Sickles, MD           10/31/2019

## 2019-11-16 ENCOUNTER — Other Ambulatory Visit: Payer: Self-pay | Admitting: Family Medicine

## 2019-11-24 ENCOUNTER — Other Ambulatory Visit: Payer: Self-pay | Admitting: Family Medicine

## 2019-11-26 NOTE — Telephone Encounter (Signed)
Patient is coming on 9/22 for appt. Will refill at that time

## 2019-11-28 ENCOUNTER — Encounter: Payer: Self-pay | Admitting: Family Medicine

## 2019-11-28 ENCOUNTER — Ambulatory Visit (INDEPENDENT_AMBULATORY_CARE_PROVIDER_SITE_OTHER): Payer: HMO | Admitting: Family Medicine

## 2019-11-28 ENCOUNTER — Other Ambulatory Visit: Payer: Self-pay | Admitting: Family Medicine

## 2019-11-28 ENCOUNTER — Other Ambulatory Visit: Payer: Self-pay

## 2019-11-28 VITALS — BP 147/84 | HR 62 | Temp 98.5°F | Resp 12 | Ht 68.0 in | Wt 248.8 lb

## 2019-11-28 DIAGNOSIS — E1149 Type 2 diabetes mellitus with other diabetic neurological complication: Secondary | ICD-10-CM | POA: Diagnosis not present

## 2019-11-28 DIAGNOSIS — F411 Generalized anxiety disorder: Secondary | ICD-10-CM

## 2019-11-28 DIAGNOSIS — K580 Irritable bowel syndrome with diarrhea: Secondary | ICD-10-CM

## 2019-11-28 DIAGNOSIS — E65 Localized adiposity: Secondary | ICD-10-CM

## 2019-11-28 LAB — BASIC METABOLIC PANEL
BUN: 10 mg/dL (ref 6–23)
CO2: 34 mEq/L — ABNORMAL HIGH (ref 19–32)
Calcium: 9.1 mg/dL (ref 8.4–10.5)
Chloride: 101 mEq/L (ref 96–112)
Creatinine, Ser: 1.06 mg/dL (ref 0.40–1.50)
GFR: 68.52 mL/min (ref >60.00)
Glucose, Bld: 107 mg/dL — ABNORMAL HIGH (ref 70–99)
Potassium: 3.6 mEq/L (ref 3.5–5.1)
Sodium: 141 mEq/L (ref 135–145)

## 2019-11-28 LAB — HEMOGLOBIN A1C: Hgb A1c MFr Bld: 6.9 % — ABNORMAL HIGH (ref 4.6–6.5)

## 2019-11-28 MED ORDER — ESCITALOPRAM OXALATE 10 MG PO TABS
10.0000 mg | ORAL_TABLET | Freq: Every day | ORAL | 3 refills | Status: DC
Start: 2019-11-28 — End: 2020-12-01

## 2019-11-28 NOTE — Progress Notes (Signed)
OFFICE VISIT  11/28/2019  CC:  Chief Complaint  Patient presents with  . Follow-up    dm/ibs/stress/anxiety  . knot under right arm pit    HPI:    Patient is a 74 y.o. male who presents for 1 mo f/u DM, IBS, anxiety/stress. Also has "knot under right armpit".  A/P as of last visit: "1) Anxiety/anger control probs/stress rxn: Improved on lexapro 10mg  qd for the last month. Continue at current dose.  2) IBS w/diarrhea-->start trial of bentyl 10mg  qAC and qhs. Continue daily metamucil."  INTERIM HX: Says he is feeling well.  DM: Three mo ago his a1c was up to 7.3% and I increased his metformin to 500 mg bid.  IBS: the bentyl has been helping well, takes the med a few times a day.  Still taking metamucil tabs qd.  Anxiety/stress:  lexapro continues to help "it has helped tremendously".   Right axilla--soft, painless fullness x 2 mo or so, no change in size over time.  No overlying skin changes.  No firm nodule or hardened area.   Past Medical History:  Diagnosis Date  . Coronary artery disease    a. DES to LAD 04/2011 - took Effient/ASA x 1 yr, now on ASA only. b. Cath 10/2012: patent stent, otherwise normal cors. c. 05/2018 Prox LAD stent 75% restenosed, DES placed.  08/30/19 cath: patent stent, mild distal LAD dz not requiring intervention, LVEF nl, EDP normal: maximize antianginal meds.  . Diabetes mellitus with complication ([place]) 98/3382   A1c 6.9%  . Diabetic peripheral neuropathy ([place])   . GERD (gastroesophageal reflux disease)    HAS HAD ESOPHAGUS STRETCHED SEVERAL TIMES IN THE PAST  . Hyperlipidemia    goal LDL < 70  . Hypertension   . Myocardial infarction ([place])   . OSA on CPAP 10/18/2011   doesn't wear it  . Right knee pain    TORN RIGHT KNEE MEDIAL MENSICAL TEAR  . Syncope    a. 10/2012.    Past Surgical History:  Procedure Laterality Date  . CARDIAC CATHETERIZATION  06/2017   In-stent restenosis cleared out  . CARDIOVASCULAR STRESS TEST     Latter  part of 2019--normal per cardiologist's note from 10/2017  . CERVICAL FUSION  1990 and 1993   X 2   SURGERIES    SLIGHT LIMITATION ROM  . COLONOSCOPY  11/24/2004; 2018   NORMAL.  Recall 2016.  Repeat 2018 normal per pt.  . CORONARY ANGIOPLASTY WITH STENT PLACEMENT    . CORONARY STENT INTERVENTION N/A 05/22/2018   Prox LAD for in stent restenosis.  DAPT x 1 yr.  Procedure: CORONARY STENT INTERVENTION;  Surgeon: Jettie Booze, MD;  Location: [facility];  Service: Cardiovascular;  Laterality: N/A;  . INTRAVASCULAR ULTRASOUND/IVUS N/A 05/22/2018   Procedure: Intravascular Ultrasound/IVUS;  Surgeon: Jettie Booze, MD;  Location: [facility];  Service: Cardiovascular;  Laterality: N/A;  . KNEE ARTHROSCOPY  10/22/2011   Procedure: ARTHROSCOPY KNEE;  Surgeon: Gearlean Alf, MD;  Location: [facility];  Service: Orthopedics;  Laterality: Right;  Right knee scope with debridement  . LAPAROSCOPIC CHOLECYSTECTOMY  2018  . LEFT HEART CATH AND CORONARY ANGIOGRAPHY N/A 05/22/2018   DES placed for in stent restenosis.  EF 55-60%.  Procedure: LEFT HEART CATH AND CORONARY ANGIOGRAPHY;  Surgeon: Jettie Booze, MD;  Location: [facility];  Service: Cardiovascular;  Laterality: N/A;  . LEFT HEART CATH AND CORONARY ANGIOGRAPHY N/A 08/30/2019   No  change from 2020 cath->patent LAD stent, mild distal LAD dz.  No intervention required. Nitrates + possible future CCB antianginal for poss microvasc dz. Procedure: LEFT HEART CATH AND CORONARY ANGIOGRAPHY;  Surgeon: Jettie Booze, MD;  Location: [facility];  Service: Cardiovascular;  Laterality: N/A;  . LEFT HEART CATHETERIZATION WITH CORONARY ANGIOGRAM N/A 10/08/2012   Procedure: LEFT HEART CATHETERIZATION WITH CORONARY ANGIOGRAM;  Surgeon: Lorretta Harp, MD;  Location: [facility];  Service: Cardiovascular;  Laterality: N/A;  . NASAL SINUS SURGERY     ONE SINUS SURGERY THRU NOSE AND ANOTHER SINUS SURGERY THRU INCISION ABOVE  RT EYE  . TRANSTHORACIC ECHOCARDIOGRAM  05/21/2018   EF 60-65%, grd I DD, no valvular probs.    Outpatient Medications Prior to Visit  Medication Sig Dispense Refill  . acetaminophen (TYLENOL) 500 MG tablet Take 1,500 mg by mouth every 6 (six) hours as needed for mild pain or moderate pain (Sinus).    Marland Kitchen Apoaequorin (PREVAGEN) 10 MG CAPS Take 10 mg by mouth daily.    Marland Kitchen aspirin EC 81 MG EC tablet Take 1 tablet (81 mg total) by mouth daily.    Marland Kitchen atorvastatin (LIPITOR) 80 MG tablet TAKE 1 TABLET (80 MG TOTAL) BY MOUTH DAILY AT 6 PM. (Patient taking differently: Take 80 mg by mouth daily. ) 90 tablet 1  . cholecalciferol (VITAMIN D) 1000 UNITS tablet Take 2,000 Units by mouth every evening.     . clopidogrel (PLAVIX) 75 MG tablet Take 1 tablet (75 mg total) by mouth daily with breakfast. 90 tablet 3  . dicyclomine (BENTYL) 10 MG capsule TAKE 1 CAPSULE (10 MG TOTAL) BY MOUTH 4 (FOUR) TIMES DAILY - BEFORE MEALS AND AT BEDTIME. 120 capsule 1  . fenofibrate (TRICOR) 145 MG tablet Take 1 tablet (145 mg total) by mouth daily. 90 tablet 3  . furosemide (LASIX) 40 MG tablet Take 40 mg by mouth See admin instructions. Take every  day except: Sunday    . gabapentin (NEURONTIN) 300 MG capsule Take 1 capsule (300 mg total) by mouth 2 (two) times daily. 180 capsule 3  . isosorbide mononitrate (IMDUR) 30 MG 24 hr tablet Take 0.5 tablets (15 mg total) by mouth daily. 45 tablet 3  . lisinopril (ZESTRIL) 40 MG tablet Take 1 tablet (40 mg total) by mouth daily. Please keep upcoming appt in July before anymore refills. Thank you 90 tablet 1  . metFORMIN (GLUCOPHAGE) 500 MG tablet Take 1 tablet (500 mg total) by mouth 2 (two) times daily with a meal.    . metoprolol succinate (TOPROL-XL) 100 MG 24 hr tablet TAKE 1 TABLET (100 MG TOTAL) BY MOUTH DAILY. TAKE WITH OR IMMEDIATELY FOLLOWING A MEAL. 90 tablet 0  . nitroGLYCERIN (NITROSTAT) 0.4 MG SL tablet PLACE 1 TABLET UNDER THE TONGUE EVERY 5 MINUTES X 3 DOSES AS NEEDED  FOR CHEST PAIN. 25 tablet 6  . pantoprazole (PROTONIX) 40 MG tablet Take 40 mg by mouth every evening.     . phenylephrine (SINUS RELIEF EXTRA STRENGTH) 1 % nasal spray Place 1 drop into both nostrils every 6 (six) hours as needed for congestion.     . vitamin B-12 (CYANOCOBALAMIN) 1000 MCG tablet Take 1,000 mcg by mouth every other day.    . escitalopram (LEXAPRO) 10 MG tablet Take 1 tablet (10 mg total) by mouth daily. 30 tablet 1   No facility-administered medications prior to visit.    No Known Allergies  ROS As per HPI  PE: Vitals  with BMI 11/28/2019 10/31/2019 10/03/2019  Height 5\' 8"  5\' 8"  5\' 8"   Weight 248 lbs 13 oz 251 lbs 6 oz 257 lbs 10 oz  BMI 37.84 41.03 01.31  Systolic 438 887 579  Diastolic 84 78 77  Pulse 62 58 54   Gen: Alert, well appearing.  Patient is oriented to person, place, time, and situation. AFFECT: pleasant, lucid thought and speech. R axilla with indistinct soft/fatty fluctuance--fullness that is w/out induration, nodularity, tenderness, or any hint of palpable mass.  No rash.  LABS:  Lab Results  Component Value Date   HGBA1C 7.2 (H) 08/24/2019     Chemistry      Component Value Date/Time   NA 142 10/03/2019 1058   NA 141 08/18/2018 1010   K 4.0 10/03/2019 1058   CL 109 10/03/2019 1058   CO2 26 10/03/2019 1058   BUN 10 10/03/2019 1058   BUN 15 08/18/2018 1010   CREATININE 0.94 10/03/2019 1058      Component Value Date/Time   CALCIUM 9.2 10/03/2019 1058   ALKPHOS 60 08/24/2019 1040   AST 15 08/24/2019 1040   ALT 15 08/24/2019 1040   BILITOT 0.5 08/24/2019 1040   BILITOT 0.6 08/18/2018 1010     Lab Results  Component Value Date   CHOL 194 08/24/2019   HDL 45.30 08/24/2019   LDLCALC 27 08/18/2018   LDLDIRECT 100.0 08/24/2019   TRIG 341.0 (H) 08/24/2019   CHOLHDL 4 08/24/2019    IMPRESSION AND PLAN:  1) DM 2, says he's been compliant with metformin 500 mg bid. Noncompliant with diet and exercise. HbA1c and lytes/cr today.  2)  IBS: improved with daily metamucil tabs and bentyl 10mg  tid -qid. No changes.  3) Anxiety: doing great on lexapro 10mg  qd. No changes.  4) Asymmetric axillary fatty tissue: I reassured him, exam feels completely benign. Obs. Signs/symptoms to call or return for were reviewed and pt expressed understanding.  An After Visit Summary was printed and given to the patient.  FOLLOW UP: Return in about 3 months (around 02/27/2020) for annual CPE (fasting) + [facility] f/u.  Signed:  Crissie Sickles, MD           11/28/2019

## 2019-12-10 ENCOUNTER — Other Ambulatory Visit: Payer: Self-pay | Admitting: Family Medicine

## 2020-01-02 ENCOUNTER — Telehealth: Payer: Self-pay | Admitting: Family Medicine

## 2020-01-02 NOTE — Telephone Encounter (Signed)
Patient was able to be scheduled in one of slots on hold for tomorrow. Nothing further needed.

## 2020-01-02 NOTE — Telephone Encounter (Signed)
Patient states he has blood in his urine for a few days. Does not want virtual visit, to go to [facility] or any other provider. Dr. Anitra Lauth does not have an available appt for the next two weeks. Patient would like to be worked in some time the first of next week. Please call patient to advise.

## 2020-01-03 ENCOUNTER — Other Ambulatory Visit: Payer: Self-pay

## 2020-01-03 ENCOUNTER — Encounter: Payer: Self-pay | Admitting: Family Medicine

## 2020-01-03 ENCOUNTER — Ambulatory Visit (INDEPENDENT_AMBULATORY_CARE_PROVIDER_SITE_OTHER): Payer: HMO | Admitting: Family Medicine

## 2020-01-03 VITALS — BP 186/81 | HR 60 | Temp 98.2°F | Resp 16 | Ht 68.0 in | Wt 246.6 lb

## 2020-01-03 DIAGNOSIS — Z955 Presence of coronary angioplasty implant and graft: Secondary | ICD-10-CM

## 2020-01-03 DIAGNOSIS — R310 Gross hematuria: Secondary | ICD-10-CM

## 2020-01-03 DIAGNOSIS — I2510 Atherosclerotic heart disease of native coronary artery without angina pectoris: Secondary | ICD-10-CM

## 2020-01-03 DIAGNOSIS — R319 Hematuria, unspecified: Secondary | ICD-10-CM | POA: Diagnosis not present

## 2020-01-03 LAB — POCT URINALYSIS DIPSTICK
Bilirubin, UA: NEGATIVE
Glucose, UA: NEGATIVE
Ketones, UA: NEGATIVE
Leukocytes, UA: NEGATIVE
Nitrite, UA: NEGATIVE
Protein, UA: POSITIVE — AB
Spec Grav, UA: 1.015 (ref 1.010–1.025)
Urobilinogen, UA: 0.2 E.U./dL
pH, UA: 7 (ref 5.0–8.0)

## 2020-01-03 NOTE — Progress Notes (Signed)
OFFICE VISIT  01/03/2020  CC:  Chief Complaint  Patient presents with  . Blood in urine    x 11 days, has worsened    HPI:    Patient is a 74 y.o. male who presents for "blood in urine". Onset 10 d/a, blood in urine consistently. No pain with urination, no abd or flank or back pain.  No signif change in urine urgency or frequency lately. No f/c/malaise. Has been having to lift/assist wife lately a lot b/c of her health issues + not having as much sleep lately due to this.  Sounds like he may have had an isolated episode of seeing some blood on his underwear a month or two ago. Says younger brother dx'd with what sounds like ureteral or bladder ca recently (age 29).  No hx of kidney stones.  He was a farmer for many years, sprayed chemicals on crops a lot.   ROS: no nosebleeds or blood in stool, no melena. no fevers, no CP, no SOB, no wheezing, no cough, no dizziness, no HAs, no rashes No polyuria or polydipsia.  No myalgias or arthralgias.  No focal weakness, paresthesias, or tremors.  No acute vision or hearing abnormalities. No n/v/d or abd pain.  No palpitations.    Past Medical History:  Diagnosis Date  . Coronary artery disease    a. DES to LAD 04/2011 - took Effient/ASA x 1 yr, now on ASA only. b. Cath 10/2012: patent stent, otherwise normal cors. c. 05/2018 Prox LAD stent 75% restenosed, DES placed.  08/30/19 cath: patent stent, mild distal LAD dz not requiring intervention, LVEF nl, EDP normal: maximize antianginal meds.  . Diabetes mellitus with complication ([place]) 24/2683   A1c 6.9%  . Diabetic peripheral neuropathy ([place])   . GERD (gastroesophageal reflux disease)    HAS HAD ESOPHAGUS STRETCHED SEVERAL TIMES IN THE PAST  . Hyperlipidemia    goal LDL < 70  . Hypertension   . Myocardial infarction ([place])   . OSA on CPAP 10/18/2011   doesn't wear it  . Right knee pain    TORN RIGHT KNEE MEDIAL MENSICAL TEAR  . Syncope    a. 10/2012.    Past Surgical History:   Procedure Laterality Date  . CARDIAC CATHETERIZATION  06/2017   In-stent restenosis cleared out  . CARDIOVASCULAR STRESS TEST     Latter part of 2019--normal per cardiologist's note from 10/2017  . CERVICAL FUSION  1990 and 1993   X 2   SURGERIES    SLIGHT LIMITATION ROM  . COLONOSCOPY  11/24/2004; 2018   NORMAL.  Recall 2016.  Repeat 2018 normal per pt.  . CORONARY ANGIOPLASTY WITH STENT PLACEMENT    . CORONARY STENT INTERVENTION N/A 05/22/2018   Prox LAD for in stent restenosis.  DAPT x 1 yr.  Procedure: CORONARY STENT INTERVENTION;  Surgeon: Jettie Booze, MD;  Location: [facility];  Service: Cardiovascular;  Laterality: N/A;  . INTRAVASCULAR ULTRASOUND/IVUS N/A 05/22/2018   Procedure: Intravascular Ultrasound/IVUS;  Surgeon: Jettie Booze, MD;  Location: [facility];  Service: Cardiovascular;  Laterality: N/A;  . KNEE ARTHROSCOPY  10/22/2011   Procedure: ARTHROSCOPY KNEE;  Surgeon: Gearlean Alf, MD;  Location: [facility];  Service: Orthopedics;  Laterality: Right;  Right knee scope with debridement  . LAPAROSCOPIC CHOLECYSTECTOMY  2018  . LEFT HEART CATH AND CORONARY ANGIOGRAPHY N/A 05/22/2018   DES placed for in stent restenosis.  EF 55-60%.  Procedure: LEFT HEART CATH AND CORONARY  ANGIOGRAPHY;  Surgeon: Jettie Booze, MD;  Location: [facility];  Service: Cardiovascular;  Laterality: N/A;  . LEFT HEART CATH AND CORONARY ANGIOGRAPHY N/A 08/30/2019   No change from 2020 cath->patent LAD stent, mild distal LAD dz.  No intervention required. Nitrates + possible future CCB antianginal for poss microvasc dz. Procedure: LEFT HEART CATH AND CORONARY ANGIOGRAPHY;  Surgeon: Jettie Booze, MD;  Location: [facility];  Service: Cardiovascular;  Laterality: N/A;  . LEFT HEART CATHETERIZATION WITH CORONARY ANGIOGRAM N/A 10/08/2012   Procedure: LEFT HEART CATHETERIZATION WITH CORONARY ANGIOGRAM;  Surgeon: Lorretta Harp, MD;  Location: [facility];   Service: Cardiovascular;  Laterality: N/A;  . NASAL SINUS SURGERY     ONE SINUS SURGERY THRU NOSE AND ANOTHER SINUS SURGERY THRU INCISION ABOVE RT EYE  . TRANSTHORACIC ECHOCARDIOGRAM  05/21/2018   EF 60-65%, grd I DD, no valvular probs.    Outpatient Medications Prior to Visit  Medication Sig Dispense Refill  . acetaminophen (TYLENOL) 500 MG tablet Take 1,500 mg by mouth every 6 (six) hours as needed for mild pain or moderate pain (Sinus).    Marland Kitchen Apoaequorin (PREVAGEN) 10 MG CAPS Take 10 mg by mouth daily.    Marland Kitchen aspirin EC 81 MG EC tablet Take 1 tablet (81 mg total) by mouth daily.    Marland Kitchen atorvastatin (LIPITOR) 80 MG tablet TAKE 1 TABLET (80 MG TOTAL) BY MOUTH DAILY AT 6 PM. (Patient taking differently: Take 80 mg by mouth daily. ) 90 tablet 1  . cholecalciferol (VITAMIN D) 1000 UNITS tablet Take 2,000 Units by mouth every evening.     . clopidogrel (PLAVIX) 75 MG tablet Take 1 tablet (75 mg total) by mouth daily with breakfast. 90 tablet 3  . dicyclomine (BENTYL) 10 MG capsule TAKE 1 CAPSULE (10 MG TOTAL) BY MOUTH 4 (FOUR) TIMES DAILY - BEFORE MEALS AND AT BEDTIME. 360 capsule 1  . escitalopram (LEXAPRO) 10 MG tablet Take 1 tablet (10 mg total) by mouth daily. 90 tablet 3  . fenofibrate (TRICOR) 145 MG tablet Take 1 tablet (145 mg total) by mouth daily. 90 tablet 3  . furosemide (LASIX) 40 MG tablet Take 40 mg by mouth See admin instructions. Take every  day except: Sunday    . gabapentin (NEURONTIN) 300 MG capsule Take 1 capsule (300 mg total) by mouth 2 (two) times daily. 180 capsule 3  . isosorbide mononitrate (IMDUR) 30 MG 24 hr tablet Take 0.5 tablets (15 mg total) by mouth daily. 45 tablet 3  . lisinopril (ZESTRIL) 40 MG tablet Take 1 tablet (40 mg total) by mouth daily. Please keep upcoming appt in July before anymore refills. Thank you 90 tablet 1  . metFORMIN (GLUCOPHAGE) 500 MG tablet Take 1 tablet (500 mg total) by mouth 2 (two) times daily with a meal.    . metoprolol succinate  (TOPROL-XL) 100 MG 24 hr tablet TAKE 1 TABLET (100 MG TOTAL) BY MOUTH DAILY. TAKE WITH OR IMMEDIATELY FOLLOWING A MEAL. 90 tablet 0  . pantoprazole (PROTONIX) 40 MG tablet Take 40 mg by mouth every evening.     . phenylephrine (SINUS RELIEF EXTRA STRENGTH) 1 % nasal spray Place 1 drop into both nostrils every 6 (six) hours as needed for congestion.     . vitamin B-12 (CYANOCOBALAMIN) 1000 MCG tablet Take 1,000 mcg by mouth every other day.    . nitroGLYCERIN (NITROSTAT) 0.4 MG SL tablet PLACE 1 TABLET UNDER THE TONGUE EVERY 5 MINUTES X  3 DOSES AS NEEDED FOR CHEST PAIN. (Patient not taking: Reported on 01/03/2020) 25 tablet 6   No facility-administered medications prior to visit.    No Known Allergies  ROS As per HPI  PE: Vitals with BMI 01/03/2020 11/28/2019 10/31/2019  Height 5\' 8"  5\' 8"  5\' 8"   Weight 246 lbs 10 oz 248 lbs 13 oz 251 lbs 6 oz  BMI 37.5 19.14 78.29  Systolic 562 130 865  Diastolic 81 84 78  Pulse 60 62 58   Gen: Alert, well appearing.  Patient is oriented to person, place, time, and situation. AFFECT: pleasant, lucid thought and speech. CV: RRR, no m/r/g.   LUNGS: CTA bilat, nonlabored resps, good aeration in all lung fields. BACK/CVA area; no tenderness. Flank and abd: no tenderness. SKIN: no bruising or petechiae  LABS:  Lab Results  Component Value Date   TSH 1.54 08/24/2019   Lab Results  Component Value Date   WBC 7.3 08/24/2019   HGB 14.4 08/24/2019   HCT 42.8 08/24/2019   MCV 96.7 08/24/2019   PLT 165.0 08/24/2019   Lab Results  Component Value Date   CREATININE 1.06 11/28/2019   BUN 10 11/28/2019   NA 141 11/28/2019   K 3.6 11/28/2019   CL 101 11/28/2019   CO2 34 (H) 11/28/2019   Lab Results  Component Value Date   ALT 15 08/24/2019   AST 15 08/24/2019   ALKPHOS 60 08/24/2019   BILITOT 0.5 08/24/2019   Lab Results  Component Value Date   CHOL 194 08/24/2019   Lab Results  Component Value Date   HDL 45.30 08/24/2019   Lab  Results  Component Value Date   LDLCALC 27 08/18/2018   Lab Results  Component Value Date   TRIG 341.0 (H) 08/24/2019   Lab Results  Component Value Date   CHOLHDL 4 08/24/2019   Lab Results  Component Value Date   HGBA1C 6.9 (H) 11/28/2019   DIPSTICK UA TODAY: large blood, +protein.  Otherwise neg.  IMPRESSION AND PLAN:  1) Gross hematuria: acute onset. No symptoms to suggest infection as cause. Needs to r/o bladder neoplasm -->ref to urol at this time. No blood testing indicated today. Renal function, hepatic function, and cbc all normal 08/24/19. Urine sent for c/s today.  He is taking ASA and plavix, which I advised him to go ahead and continue for now. Suspect it will be okay to get off plavix prior to any planned procedure (last cath showed open stent 08/2019.  That stent was placed 05/2018 for in-stent restenosis).  An After Visit Summary was printed and given to the patient.  FOLLOW UP: Return for keep appt already set for 02/20/20.  Signed:  Crissie Sickles, MD           01/03/2020

## 2020-01-04 LAB — URINE CULTURE
MICRO NUMBER:: 11131540
Result:: NO GROWTH
SPECIMEN QUALITY:: ADEQUATE

## 2020-01-15 ENCOUNTER — Telehealth: Payer: Self-pay

## 2020-01-15 NOTE — Telephone Encounter (Signed)
A user error has taken place: encounter opened in error, closed for administrative reasons.

## 2020-01-18 ENCOUNTER — Other Ambulatory Visit: Payer: Self-pay

## 2020-01-18 NOTE — Patient Outreach (Signed)
  [facility]) Care Management Chronic Special Needs Program    01/18/2020  Name: Richard [name], DOB: 21-Apr-1945  MRN: [id number]   [name] is enrolled in a chronic special needs plan for Diabetes.  [facility] Management will continue to provide services for this client through 03/07/2020. The [facility] care management team will assume care 03/08/2020.  Peter Garter RN, Jackquline Denmark, CDE Chronic Care Management Coordinator [facility] Care Management [phone]

## 2020-02-18 ENCOUNTER — Other Ambulatory Visit: Payer: Self-pay

## 2020-02-20 ENCOUNTER — Ambulatory Visit (INDEPENDENT_AMBULATORY_CARE_PROVIDER_SITE_OTHER): Payer: HMO | Admitting: Family Medicine

## 2020-02-20 ENCOUNTER — Encounter: Payer: Self-pay | Admitting: Family Medicine

## 2020-02-20 ENCOUNTER — Other Ambulatory Visit: Payer: Self-pay

## 2020-02-20 VITALS — BP 124/77 | HR 80 | Temp 97.8°F | Resp 16 | Ht 67.0 in | Wt 245.6 lb

## 2020-02-20 DIAGNOSIS — Z0000 Encounter for general adult medical examination without abnormal findings: Secondary | ICD-10-CM | POA: Diagnosis not present

## 2020-02-20 DIAGNOSIS — Z794 Long term (current) use of insulin: Secondary | ICD-10-CM | POA: Diagnosis not present

## 2020-02-20 DIAGNOSIS — E7800 Pure hypercholesterolemia, unspecified: Secondary | ICD-10-CM

## 2020-02-20 DIAGNOSIS — I2510 Atherosclerotic heart disease of native coronary artery without angina pectoris: Secondary | ICD-10-CM | POA: Diagnosis not present

## 2020-02-20 DIAGNOSIS — E1140 Type 2 diabetes mellitus with diabetic neuropathy, unspecified: Secondary | ICD-10-CM

## 2020-02-20 DIAGNOSIS — I10 Essential (primary) hypertension: Secondary | ICD-10-CM

## 2020-02-20 DIAGNOSIS — R310 Gross hematuria: Secondary | ICD-10-CM

## 2020-02-20 DIAGNOSIS — Z125 Encounter for screening for malignant neoplasm of prostate: Secondary | ICD-10-CM

## 2020-02-20 LAB — CBC WITH DIFFERENTIAL/PLATELET
Basophils Absolute: 0 10*3/uL (ref 0.0–0.1)
Basophils Relative: 0.6 % (ref 0.0–3.0)
Eosinophils Absolute: 0.3 10*3/uL (ref 0.0–0.7)
Eosinophils Relative: 4.8 % (ref 0.0–5.0)
HCT: 43.5 % (ref 39.0–52.0)
Hemoglobin: 14.7 g/dL (ref 13.0–17.0)
Lymphocytes Relative: 29.7 % (ref 12.0–46.0)
Lymphs Abs: 2 10*3/uL (ref 0.7–4.0)
MCHC: 33.7 g/dL (ref 30.0–36.0)
MCV: 95.4 fl (ref 78.0–100.0)
Monocytes Absolute: 0.6 10*3/uL (ref 0.1–1.0)
Monocytes Relative: 8.5 % (ref 3.0–12.0)
Neutro Abs: 3.8 10*3/uL (ref 1.4–7.7)
Neutrophils Relative %: 56.4 % (ref 43.0–77.0)
Platelets: 184 10*3/uL (ref 150.0–400.0)
RBC: 4.56 Mil/uL (ref 4.22–5.81)
RDW: 13.4 % (ref 11.5–15.5)
WBC: 6.8 10*3/uL (ref 4.0–10.5)

## 2020-02-20 LAB — COMPREHENSIVE METABOLIC PANEL
ALT: 10 U/L (ref 0–53)
AST: 15 U/L (ref 0–37)
Albumin: 4.3 g/dL (ref 3.5–5.2)
Alkaline Phosphatase: 53 U/L (ref 39–117)
BUN: 13 mg/dL (ref 6–23)
CO2: 31 mEq/L (ref 19–32)
Calcium: 9.5 mg/dL (ref 8.4–10.5)
Chloride: 101 mEq/L (ref 96–112)
Creatinine, Ser: 1.01 mg/dL (ref 0.40–1.50)
GFR: 74.01 mL/min (ref >60.00)
Glucose, Bld: 122 mg/dL — ABNORMAL HIGH (ref 70–99)
Potassium: 3.8 mEq/L (ref 3.5–5.1)
Sodium: 142 mEq/L (ref 135–145)
Total Bilirubin: 0.7 mg/dL (ref 0.2–1.2)
Total Protein: 6.6 g/dL (ref 6.0–8.3)

## 2020-02-20 LAB — LIPID PANEL
Cholesterol: 234 mg/dL — ABNORMAL HIGH (ref 0–200)
HDL: 47.2 mg/dL (ref >39.00)
NonHDL: 186.57
Total CHOL/HDL Ratio: 5
Triglycerides: 275 mg/dL — ABNORMAL HIGH (ref 0.0–149.0)
VLDL: 55 mg/dL — ABNORMAL HIGH (ref 0.0–40.0)

## 2020-02-20 LAB — PSA, MEDICARE: PSA: 1 ng/ml (ref 0.10–4.00)

## 2020-02-20 LAB — HEMOGLOBIN A1C: Hgb A1c MFr Bld: 6.9 % — ABNORMAL HIGH (ref 4.6–6.5)

## 2020-02-20 LAB — LDL CHOLESTEROL, DIRECT: Direct LDL: 145 mg/dL

## 2020-02-20 NOTE — Progress Notes (Signed)
OFFICE VISIT  02/20/2020  CC:  Chief Complaint  Patient presents with  . Annual Exam    Pt is not fasting    HPI:    Patient is a 74 y.o. Caucasian male who presents for f/u DM with DPN, HTN, HLD, GAD. Most recent prob is gross hematuria, for which he has been set up to see urology.  No obvious gross hematuria since I saw him for that prob about 6 wks ago.  Has not been taking any of his meds last couple weeks (except lasix prn--2-3 d week) b/c he has appt with urol for eval of his gross hematuria and he was afraid that taking meds would impair their ability to do w/u. Feeling some irritation since being off lexapro but not too bad. Lots of stresses, pastoring a church.  Denies SI or HI.  DM: no burning, tingling, or numbness in feet or hands. No home gluc monitoring. HTN: no home bp monitoring.  HLD: noncompliant with statin las couple weeks but prior to that was taking and no problems.  Denies CP, DOE, palpitations, or dizziness.   Past Medical History:  Diagnosis Date  . Coronary artery disease    a. DES to LAD 04/2011 - took Effient/ASA x 1 yr, now on ASA only. b. Cath 10/2012: patent stent, otherwise normal cors. c. 05/2018 Prox LAD stent 75% restenosed, DES placed.  08/30/19 cath: patent stent, mild distal LAD dz not requiring intervention, LVEF nl, EDP normal: maximize antianginal meds.  . Diabetes mellitus with complication ([place]) 38/1017   A1c 6.9%  . Diabetic peripheral neuropathy ([place])   . GERD (gastroesophageal reflux disease)    HAS HAD ESOPHAGUS STRETCHED SEVERAL TIMES IN THE PAST  . Hyperlipidemia    goal LDL < 70  . Hypertension   . Myocardial infarction ([place])   . OSA on CPAP 10/18/2011   doesn't wear it  . Right knee pain    TORN RIGHT KNEE MEDIAL MENSICAL TEAR  . Syncope    a. 10/2012.    Past Surgical History:  Procedure Laterality Date  . CARDIAC CATHETERIZATION  06/2017   In-stent restenosis cleared out  . CARDIOVASCULAR STRESS TEST     Latter  part of 2019--normal per cardiologist's note from 10/2017  . CERVICAL FUSION  1990 and 1993   X 2   SURGERIES    SLIGHT LIMITATION ROM  . COLONOSCOPY  11/24/2004; 2018   NORMAL.  Recall 2016.  Repeat 2018 normal per pt.  . CORONARY ANGIOPLASTY WITH STENT PLACEMENT    . CORONARY STENT INTERVENTION N/A 05/22/2018   Prox LAD for in stent restenosis.  DAPT x 1 yr.  Procedure: CORONARY STENT INTERVENTION;  Surgeon: Jettie Booze, MD;  Location: [facility];  Service: Cardiovascular;  Laterality: N/A;  . INTRAVASCULAR ULTRASOUND/IVUS N/A 05/22/2018   Procedure: Intravascular Ultrasound/IVUS;  Surgeon: Jettie Booze, MD;  Location: [facility];  Service: Cardiovascular;  Laterality: N/A;  . KNEE ARTHROSCOPY  10/22/2011   Procedure: ARTHROSCOPY KNEE;  Surgeon: Gearlean Alf, MD;  Location: [facility];  Service: Orthopedics;  Laterality: Right;  Right knee scope with debridement  . LAPAROSCOPIC CHOLECYSTECTOMY  2018  . LEFT HEART CATH AND CORONARY ANGIOGRAPHY N/A 05/22/2018   DES placed for in stent restenosis.  EF 55-60%.  Procedure: LEFT HEART CATH AND CORONARY ANGIOGRAPHY;  Surgeon: Jettie Booze, MD;  Location: [facility];  Service: Cardiovascular;  Laterality: N/A;  . LEFT HEART CATH AND CORONARY  ANGIOGRAPHY N/A 08/30/2019   No change from 2020 cath->patent LAD stent, mild distal LAD dz.  No intervention required. Nitrates + possible future CCB antianginal for poss microvasc dz. Procedure: LEFT HEART CATH AND CORONARY ANGIOGRAPHY;  Surgeon: Jettie Booze, MD;  Location: [facility];  Service: Cardiovascular;  Laterality: N/A;  . LEFT HEART CATHETERIZATION WITH CORONARY ANGIOGRAM N/A 10/08/2012   Procedure: LEFT HEART CATHETERIZATION WITH CORONARY ANGIOGRAM;  Surgeon: Lorretta Harp, MD;  Location[facility];  Service: Cardiovascular;  Laterality: N/A;  . NASAL SINUS SURGERY     ONE SINUS SURGERY THRU NOSE AND ANOTHER SINUS SURGERY THRU INCISION ABOVE  RT EYE  . TRANSTHORACIC ECHOCARDIOGRAM  05/21/2018   EF 60-65%, grd I DD, no valvular probs.   Social History   Socioeconomic History  . Marital status: Married    Spouse name: [name]  . Number of children: Not on file  . Years of education: Not on file  . Highest education level: Not on file  Occupational History  . Not on file  Tobacco Use  . Smoking status: Never Smoker  . Smokeless tobacco: Former Systems developer    Types: Secondary school teacher  . Vaping Use: Never used  Substance and Sexual Activity  . Alcohol use: No  . Drug use: No  . Sexual activity: Not on file  Other Topics Concern  . Not on file  Social History Narrative   Married, 2 daughters.  2 other daughters deceased.   Educ: HS   Occup: farmer, retired from [employer], Theme park manager at [employer].   No T/A/Ds   Social Determinants of Radio broadcast assistant Strain: Not on file  Food Insecurity: Not on file  Transportation Needs: Not on file  Physical Activity: Not on file  Stress: Not on file  Social Connections: Not on file   Family History  Problem Relation Age of Onset  . Arthritis Mother   . Cancer Father      Outpatient Medications Prior to Visit  Medication Sig Dispense Refill  . acetaminophen (TYLENOL) 500 MG tablet Take 1,500 mg by mouth every 6 (six) hours as needed for mild pain or moderate pain (Sinus).    Marland Kitchen Apoaequorin (PREVAGEN) 10 MG CAPS Take 10 mg by mouth daily.    . furosemide (LASIX) 40 MG tablet Take 40 mg by mouth See admin instructions. Take every  day except: Sunday    . nitroGLYCERIN (NITROSTAT) 0.4 MG SL tablet PLACE 1 TABLET UNDER THE TONGUE EVERY 5 MINUTES X 3 DOSES AS NEEDED FOR CHEST PAIN. 25 tablet 6  . aspirin EC 81 MG EC tablet Take 1 tablet (81 mg total) by mouth daily. (Patient not taking: Reported on 02/20/2020)    . atorvastatin (LIPITOR) 80 MG tablet TAKE 1 TABLET (80 MG TOTAL) BY MOUTH DAILY AT 6 PM. (Patient not taking: Reported on 02/20/2020) 90 tablet 1  .  cholecalciferol (VITAMIN D) 1000 UNITS tablet Take 2,000 Units by mouth every evening. (Patient not taking: Reported on 02/20/2020)    . clopidogrel (PLAVIX) 75 MG tablet Take 1 tablet (75 mg total) by mouth daily with breakfast. (Patient not taking: Reported on 02/20/2020) 90 tablet 3  . dicyclomine (BENTYL) 10 MG capsule TAKE 1 CAPSULE (10 MG TOTAL) BY MOUTH 4 (FOUR) TIMES DAILY - BEFORE MEALS AND AT BEDTIME. (Patient not taking: Reported on 02/20/2020) 360 capsule 1  . escitalopram (LEXAPRO) 10 MG tablet Take 1 tablet (10 mg total) by mouth daily. (Patient  not taking: Reported on 02/20/2020) 90 tablet 3  . fenofibrate (TRICOR) 145 MG tablet Take 1 tablet (145 mg total) by mouth daily. (Patient not taking: Reported on 02/20/2020) 90 tablet 3  . gabapentin (NEURONTIN) 300 MG capsule Take 1 capsule (300 mg total) by mouth 2 (two) times daily. (Patient not taking: Reported on 02/20/2020) 180 capsule 3  . isosorbide mononitrate (IMDUR) 30 MG 24 hr tablet Take 0.5 tablets (15 mg total) by mouth daily. (Patient not taking: Reported on 02/20/2020) 45 tablet 3  . lisinopril (ZESTRIL) 40 MG tablet Take 1 tablet (40 mg total) by mouth daily. Please keep upcoming appt in July before anymore refills. Thank you (Patient not taking: Reported on 02/20/2020) 90 tablet 1  . metFORMIN (GLUCOPHAGE) 500 MG tablet Take 1 tablet (500 mg total) by mouth 2 (two) times daily with a meal. (Patient not taking: Reported on 02/20/2020)    . metoprolol succinate (TOPROL-XL) 100 MG 24 hr tablet TAKE 1 TABLET (100 MG TOTAL) BY MOUTH DAILY. TAKE WITH OR IMMEDIATELY FOLLOWING A MEAL. (Patient not taking: Reported on 02/20/2020) 90 tablet 0  . pantoprazole (PROTONIX) 40 MG tablet Take 40 mg by mouth every evening.  (Patient not taking: Reported on 02/20/2020)    . phenylephrine (SINUS RELIEF EXTRA STRENGTH) 1 % nasal spray Place 1 drop into both nostrils every 6 (six) hours as needed for congestion.  (Patient not taking: Reported on  02/20/2020)    . vitamin B-12 (CYANOCOBALAMIN) 1000 MCG tablet Take 1,000 mcg by mouth every other day. (Patient not taking: Reported on 02/20/2020)     No facility-administered medications prior to visit.    No Known Allergies  ROS Review of Systems  Constitutional: Negative for appetite change, chills, fatigue and fever.  HENT: Negative for congestion, dental problem, ear pain and sore throat.   Eyes: Negative for discharge, redness and visual disturbance.  Respiratory: Negative for cough, chest tightness, shortness of breath and wheezing.   Cardiovascular: Negative for chest pain, palpitations and leg swelling.  Gastrointestinal: Negative for abdominal pain, blood in stool, diarrhea, nausea and vomiting.  Genitourinary: Negative for difficulty urinating, dysuria, flank pain, frequency, hematuria and urgency.  Musculoskeletal: Negative for arthralgias, back pain, joint swelling, myalgias and neck stiffness.  Skin: Negative for pallor and rash.  Neurological: Negative for dizziness, speech difficulty, weakness and headaches.  Hematological: Negative for adenopathy. Does not bruise/bleed easily.  Psychiatric/Behavioral: Negative for confusion and sleep disturbance. The patient is not nervous/anxious.     PE: Vitals with BMI 02/20/2020 01/03/2020 11/28/2019  Height 5\' 7"  5\' 8"  5\' 8"   Weight 245 lbs 10 oz 246 lbs 10 oz 248 lbs 13 oz  BMI 38.46 69.6 78.93  Systolic 810 175 102  Diastolic 77 81 84  Pulse 80 60 62     Gen: Alert, well appearing.  Patient is oriented to person, place, time, and situation. AFFECT: pleasant, lucid thought and speech. ENT: Ears: EACs clear, normal epithelium.  TMs with good light reflex and landmarks bilaterally.  Eyes: no injection, icteris, swelling, or exudate.  EOMI, PERRLA. Nose: no drainage or turbinate edema/swelling.  No injection or focal lesion.  Mouth: lips without lesion/swelling.  Oral mucosa pink and moist.  Dentition intact and without  obvious caries or gingival swelling.  Oropharynx without erythema, exudate, or swelling.  Neck: supple/nontender.  No LAD, mass, or TM.  Carotid pulses 2+ bilaterally, without bruits. CV: RRR, no m/r/g.   LUNGS: CTA bilat, nonlabored resps, good aeration in all lung  fields. ABD: soft, NT, ND, BS normal.  No hepatospenomegaly or mass.  No bruits. EXT: no clubbing, cyanosis, or edema.  Musculoskeletal: no joint swelling, erythema, warmth, or tenderness.  ROM of all joints intact. Skin - no sores or suspicious lesions or rashes or color changes    LABS:  Lab Results  Component Value Date   TSH 1.54 08/24/2019   Lab Results  Component Value Date   WBC 7.3 08/24/2019   HGB 14.4 08/24/2019   HCT 42.8 08/24/2019   MCV 96.7 08/24/2019   PLT 165.0 08/24/2019   Lab Results  Component Value Date   CREATININE 1.06 11/28/2019   BUN 10 11/28/2019   NA 141 11/28/2019   K 3.6 11/28/2019   CL 101 11/28/2019   CO2 34 (H) 11/28/2019   Lab Results  Component Value Date   ALT 15 08/24/2019   AST 15 08/24/2019   ALKPHOS 60 08/24/2019   BILITOT 0.5 08/24/2019   Lab Results  Component Value Date   CHOL 194 08/24/2019   Lab Results  Component Value Date   HDL 45.30 08/24/2019   Lab Results  Component Value Date   LDLCALC 27 08/18/2018   Lab Results  Component Value Date   TRIG 341.0 (H) 08/24/2019   Lab Results  Component Value Date   CHOLHDL 4 08/24/2019   Lab Results  Component Value Date   HGBA1C 6.9 (H) 11/28/2019    IMPRESSION AND PLAN:  1) DM: noncompliant with med last couple weeks.   Feet exam normal today. Hba1c and lytes/cr today. Restart metformin.  2) HTN: good control. Lytes/cr today. Cont toprol, lisin, imdur.  3) HLD: tolerating statin. FLP and hepatic panel today.  4) CAD: asymptomatic. Cont DAPT, BB, statin, ACE-I, imdur.  5) Gross hematuria: no imaging or cystoscopy have been done yet---pt has initial consult appt with urol tomorrow.  6)  Health maintenance exam: Reviewed age and gender appropriate health maintenance issues (prudent diet, regular exercise, health risks of tobacco and excessive alcohol, use of seatbelts, fire alarms in home, use of sunscreen).  Also reviewed age and gender appropriate health screening as well as vaccine recommendations. Vaccines: Pt declines flu, covid, and Tdap vaccines.  Shingrix->he declines.  Prevnar 13->declines. Labs: fasting HP, a1c, PSA. Prostate ca screening: PSA today. Colon ca screening: colonoscopy x 2 normal, most recent 2018.    An After Visit Summary was printed and given to the patient.  FOLLOW UP: Return in about 3 months (around 05/20/2020) for routine chronic illness f/u.  Signed:  Crissie Sickles, MD           02/20/2020

## 2020-02-21 DIAGNOSIS — N400 Enlarged prostate without lower urinary tract symptoms: Secondary | ICD-10-CM | POA: Diagnosis not present

## 2020-02-21 DIAGNOSIS — R310 Gross hematuria: Secondary | ICD-10-CM | POA: Diagnosis not present

## 2020-03-08 DIAGNOSIS — C679 Malignant neoplasm of bladder, unspecified: Secondary | ICD-10-CM

## 2020-03-08 DIAGNOSIS — N3289 Other specified disorders of bladder: Secondary | ICD-10-CM

## 2020-03-08 HISTORY — DX: Malignant neoplasm of bladder, unspecified: C67.9

## 2020-03-08 HISTORY — DX: Other specified disorders of bladder: N32.89

## 2020-03-10 DIAGNOSIS — R310 Gross hematuria: Secondary | ICD-10-CM | POA: Diagnosis not present

## 2020-03-11 NOTE — Progress Notes (Deleted)
Subjective:   Richard [name] is a 75 y.o. male who presents for an Initial Medicare Annual Wellness Visit.  I connected with Yeshua today by telephone and verified that I am speaking with the correct person using two identifiers. Location patient: home Location provider: work Persons participating in the virtual visit: patient, Marine scientist.    I discussed the limitations, risks, security and privacy concerns of performing an evaluation and management service by telephone and the availability of in person appointments. I also discussed with the patient that there may be a patient responsible charge related to this service. The patient expressed understanding and verbally consented to this telephonic visit.    Interactive audio and video telecommunications were attempted between this provider and patient, however failed, due to patient having technical difficulties OR patient did not have access to video capability.  We continued and completed visit with audio only.  Some vital signs may be absent or patient reported.   Time Spent with patient on telephone encounter: *** minutes   Review of Systems    ***       Objective:    There were no vitals filed for this visit. There is no height or weight on file to calculate BMI.  Advanced Directives 08/30/2019 06/28/2018 05/20/2018 05/19/2018 10/08/2012 10/18/2011  Does Patient Have a Medical Advance Directive? Yes No No No Patient does not have advance directive Patient does not have advance directive;Patient would like information  Type of Advance Directive [place];Living will - - - - -  Would patient like information on creating a medical advance directive? - No - Patient declined No - Patient declined - - Advance directive packet given  Pre-existing out of facility DNR order (yellow form or pink MOST form) - - - - No -    Current Medications (verified) Outpatient Encounter Medications as of 03/12/2020  Medication Sig  .  acetaminophen (TYLENOL) 500 MG tablet Take 1,500 mg by mouth every 6 (six) hours as needed for mild pain or moderate pain (Sinus).  Marland Kitchen Apoaequorin (PREVAGEN) 10 MG CAPS Take 10 mg by mouth daily.  Marland Kitchen aspirin EC 81 MG EC tablet Take 1 tablet (81 mg total) by mouth daily. (Patient not taking: Reported on 02/20/2020)  . atorvastatin (LIPITOR) 80 MG tablet TAKE 1 TABLET (80 MG TOTAL) BY MOUTH DAILY AT 6 PM. (Patient not taking: Reported on 02/20/2020)  . cholecalciferol (VITAMIN D) 1000 UNITS tablet Take 2,000 Units by mouth every evening. (Patient not taking: Reported on 02/20/2020)  . clopidogrel (PLAVIX) 75 MG tablet Take 1 tablet (75 mg total) by mouth daily with breakfast. (Patient not taking: Reported on 02/20/2020)  . dicyclomine (BENTYL) 10 MG capsule TAKE 1 CAPSULE (10 MG TOTAL) BY MOUTH 4 (FOUR) TIMES DAILY - BEFORE MEALS AND AT BEDTIME. (Patient not taking: Reported on 02/20/2020)  . escitalopram (LEXAPRO) 10 MG tablet Take 1 tablet (10 mg total) by mouth daily. (Patient not taking: Reported on 02/20/2020)  . fenofibrate (TRICOR) 145 MG tablet Take 1 tablet (145 mg total) by mouth daily. (Patient not taking: Reported on 02/20/2020)  . furosemide (LASIX) 40 MG tablet Take 40 mg by mouth See admin instructions. Take every  day except: Sunday  . gabapentin (NEURONTIN) 300 MG capsule Take 1 capsule (300 mg total) by mouth 2 (two) times daily. (Patient not taking: Reported on 02/20/2020)  . isosorbide mononitrate (IMDUR) 30 MG 24 hr tablet Take 0.5 tablets (15 mg total) by mouth daily. (Patient not taking: Reported  on 02/20/2020)  . lisinopril (ZESTRIL) 40 MG tablet Take 1 tablet (40 mg total) by mouth daily. Please keep upcoming appt in July before anymore refills. Thank you (Patient not taking: Reported on 02/20/2020)  . metFORMIN (GLUCOPHAGE) 500 MG tablet Take 1 tablet (500 mg total) by mouth 2 (two) times daily with a meal. (Patient not taking: Reported on 02/20/2020)  . metoprolol succinate  (TOPROL-XL) 100 MG 24 hr tablet TAKE 1 TABLET (100 MG TOTAL) BY MOUTH DAILY. TAKE WITH OR IMMEDIATELY FOLLOWING A MEAL. (Patient not taking: Reported on 02/20/2020)  . nitroGLYCERIN (NITROSTAT) 0.4 MG SL tablet PLACE 1 TABLET UNDER THE TONGUE EVERY 5 MINUTES X 3 DOSES AS NEEDED FOR CHEST PAIN.  Marland Kitchen pantoprazole (PROTONIX) 40 MG tablet Take 40 mg by mouth every evening.  (Patient not taking: Reported on 02/20/2020)  . phenylephrine (SINUS RELIEF EXTRA STRENGTH) 1 % nasal spray Place 1 drop into both nostrils every 6 (six) hours as needed for congestion.  (Patient not taking: Reported on 02/20/2020)  . vitamin B-12 (CYANOCOBALAMIN) 1000 MCG tablet Take 1,000 mcg by mouth every other day. (Patient not taking: Reported on 02/20/2020)   No facility-administered encounter medications on file as of 03/12/2020.    Allergies (verified) Patient has no known allergies.   History: Past Medical History:  Diagnosis Date  . Coronary artery disease    a. DES to LAD 04/2011 - took Effient/ASA x 1 yr, now on ASA only. b. Cath 10/2012: patent stent, otherwise normal cors. c. 05/2018 Prox LAD stent 75% restenosed, DES placed.  08/30/19 cath: patent stent, mild distal LAD dz not requiring intervention, LVEF nl, EDP normal: maximize antianginal meds.  . Diabetes mellitus with complication (HCC) 05/2018   A1c 6.9%  . Diabetic peripheral neuropathy (HCC)   . GERD (gastroesophageal reflux disease)    HAS HAD ESOPHAGUS STRETCHED SEVERAL TIMES IN THE PAST  . Hyperlipidemia    goal LDL < 70  . Hypertension   . Myocardial infarction (HCC)   . OSA on CPAP 10/18/2011   doesn't wear it  . Right knee pain    TORN RIGHT KNEE MEDIAL MENSICAL TEAR  . Syncope    a. 10/2012.   Past Surgical History:  Procedure Laterality Date  . CARDIAC CATHETERIZATION  06/2017   In-stent restenosis cleared out  . CARDIOVASCULAR STRESS TEST     Latter part of 2019--normal per cardiologist's note from 10/2017  . CERVICAL FUSION  1990 and 1993    X 2   SURGERIES    SLIGHT LIMITATION ROM  . COLONOSCOPY  11/24/2004; 2018   NORMAL.  Recall 2016.  Repeat 2018 normal per pt.  . CORONARY ANGIOPLASTY WITH STENT PLACEMENT    . CORONARY STENT INTERVENTION N/A 05/22/2018   Prox LAD for in stent restenosis.  DAPT x 1 yr.  Procedure: CORONARY STENT INTERVENTION;  Surgeon: Corky Crafts, MD;  Location: [facility] INVASIVE CV LAB;  Service: Cardiovascular;  Laterality: N/A;  . INTRAVASCULAR ULTRASOUND/IVUS N/A 05/22/2018   Procedure: Intravascular Ultrasound/IVUS;  Surgeon: Corky Crafts, MD;  Location: [facility] INVASIVE CV LAB;  Service: Cardiovascular;  Laterality: N/A;  . KNEE ARTHROSCOPY  10/22/2011   Procedure: ARTHROSCOPY KNEE;  Surgeon: Loanne Drilling, MD;  Location: [facility] ORS;  Service: Orthopedics;  Laterality: Right;  Right knee scope with debridement  . LAPAROSCOPIC CHOLECYSTECTOMY  2018  . LEFT HEART CATH AND CORONARY ANGIOGRAPHY N/A 05/22/2018   DES placed for in stent restenosis.  EF 55-60%.  Procedure: LEFT HEART CATH  AND CORONARY ANGIOGRAPHY;  Surgeon: Jettie Booze, MD;  Location: [place] CV LAB;  Service: Cardiovascular;  Laterality: N/A;  . LEFT HEART CATH AND CORONARY ANGIOGRAPHY N/A 08/30/2019   No change from 2020 cath->patent LAD stent, mild distal LAD dz.  No intervention required. Nitrates + possible future CCB antianginal for poss microvasc dz. Procedure: LEFT HEART CATH AND CORONARY ANGIOGRAPHY;  Surgeon: Jettie Booze, MD;  Location: [place] CV LAB;  Service: Cardiovascular;  Laterality: N/A;  . LEFT HEART CATHETERIZATION WITH CORONARY ANGIOGRAM N/A 10/08/2012   Procedure: LEFT HEART CATHETERIZATION WITH CORONARY ANGIOGRAM;  Surgeon: Lorretta Harp, MD;  Location: [facility] CATH LAB;  Service: Cardiovascular;  Laterality: N/A;  . NASAL SINUS SURGERY     ONE SINUS SURGERY THRU NOSE AND ANOTHER SINUS SURGERY THRU INCISION ABOVE RT EYE  . TRANSTHORACIC ECHOCARDIOGRAM  05/21/2018   EF 60-65%, grd I DD, no valvular  probs.   Family History  Problem Relation Age of Onset  . Arthritis Mother   . Cancer Father    Social History   Socioeconomic History  . Marital status: Married    Spouse name: [name]  . Number of children: Not on file  . Years of education: Not on file  . Highest education level: Not on file  Occupational History  . Not on file  Tobacco Use  . Smoking status: Never Smoker  . Smokeless tobacco: Former Systems developer    Types: Secondary school teacher  . Vaping Use: Never used  Substance and Sexual Activity  . Alcohol use: No  . Drug use: No  . Sexual activity: Not on file  Other Topics Concern  . Not on file  Social History Narrative   Married, 2 daughters.  2 other daughters deceased.   Educ: HS   Occup: farmer, retired from [employer], Theme park manager at [employer].   No T/A/Ds   Social Determinants of Radio broadcast assistant Strain: Not on file  Food Insecurity: Not on file  Transportation Needs: Not on file  Physical Activity: Not on file  Stress: Not on file  Social Connections: Not on file    Tobacco Counseling Counseling given: Not Answered   Clinical Intake:                 Diabetes:  Is the patient diabetic?  Yes  If diabetic, was a CBG obtained today?  No  Did the patient bring in their glucometer from home?  No phone visit How often do you monitor your CBG's? ***.   Financial Strains and Diabetes Management:  Are you having any financial strains with the device, your supplies or your medication? {YES/NO:21197}.  Does the patient want to be seen by Chronic Care Management for management of their diabetes?  {YES/NO:21197} Would the patient like to be referred to a Nutritionist or for Diabetic Management?  {YES/NO:21197}  Diabetic Exams:  Diabetic Eye Exam: Completed ***. Overdue for diabetic eye exam. Pt has been advised about the importance in completing this exam. A referral has been placed today. Message sent to referral coordinator for  scheduling purposes. Advised pt to expect a call from our office re: appt.  Diabetic Foot Exam: Completed 02/20/2020.         Activities of Daily Living No flowsheet data found.  Patient Care Team: Tammi Sou, MD as PCP - General (Family Medicine) Jettie Booze, MD as PCP - Cardiology (Cardiology) Babette Relic as Attending Physician (Family Medicine) Dimitri Ped, RN  as [place], Renard Hamper, MD as Attending Physician (Cardiology)  Indicate any recent Medical Services you may have received from other than [facility] providers in the past year (date may be approximate).     Assessment:   This is a routine wellness examination for [place].  Hearing/Vision screen No exam data present  Dietary issues and exercise activities discussed:    Goals    . HEMOGLOBIN A1C < 7      Last Hemoglobin A1C 7.2% on 08/24/19 Plan to check blood sugars as directed with goals fasting or 1 1/2 hours after eating with goal of 80-130 fasting and 180 or less after meals Plan to follow a low carbohydrate, low salt diet, watch portion sizes and avoid sugar sweetened drinks      Depression Screen PHQ 2/9 Scores 11/28/2019 08/24/2019 06/28/2018  PHQ - 2 Score 0 0 0  PHQ- 9 Score 0 - -    Fall Risk No flowsheet data found.  FALL RISK PREVENTION PERTAINING TO THE HOME:  Any stairs in or around the home? {YES/NO:21197} If so, are there any without handrails? {YES/NO:21197} Home free of loose throw rugs in walkways, pet beds, electrical cords, etc? {YES/NO:21197} Adequate lighting in your home to reduce risk of falls? {YES/NO:21197}  ASSISTIVE DEVICES UTILIZED TO PREVENT FALLS:  Life alert? {YES/NO:21197} Use of a cane, walker or w/c? {YES/NO:21197} Grab bars in the bathroom? {YES/NO:21197} Shower chair or bench in shower? {YES/NO:21197} Elevated toilet seat or a handicapped toilet? {YES/NO:21197}  TIMED UP AND GO:  Was the test performed?  {YES/NO:21197}.  Length of time to ambulate 10 feet: *** sec.   {Appearance of GA:4730917  Cognitive Function:        Immunizations Immunization History  Administered Date(s) Administered  . Pneumococcal Polysaccharide-23 11/15/2012  . Zoster 10/31/2012    TDAP status: Due, Education has been provided regarding the importance of this vaccine. Advised may receive this vaccine at local pharmacy or Health Dept. Aware to provide a copy of the vaccination record if obtained from local pharmacy or Health Dept. Verbalized acceptance and understanding.  {Flu Vaccine status:2101806}  {Pneumococcal vaccine status:2101807}  {Covid-19 vaccine status:2101808}  Qualifies for Shingles Vaccine? Yes   Zostavax completed Yes   Shingrix Completed?: No.    Education has been provided regarding the importance of this vaccine. Patient has been advised to call insurance company to determine out of pocket expense if they have not yet received this vaccine. Advised may also receive vaccine at local pharmacy or Health Dept. Verbalized acceptance and understanding.  Screening Tests Health Maintenance  Topic Date Due  . OPHTHALMOLOGY EXAM  Never done  . COVID-19 Vaccine (1) 05/20/2020 (Originally 01/29/1958)  . INFLUENZA VACCINE  06/05/2020 (Originally 10/07/2019)  . TETANUS/TDAP  02/19/2021 (Originally 01/28/1965)  . HEMOGLOBIN A1C  08/20/2020  . FOOT EXAM  02/19/2021  . COLONOSCOPY (Pts 45-87yrs Insurance coverage will need to be confirmed)  03/08/2026  . PNA vac Low Risk Adult  Completed  . Hepatitis C Screening  Discontinued    Health Maintenance  Health Maintenance Due  Topic Date Due  . OPHTHALMOLOGY EXAM  Never done    Colorectal cancer screening: Type of screening: Colonoscopy. Completed 03/08/2016. Repeat every 10 years  Lung Cancer Screening: (Low Dose CT Chest recommended if Age 26-80 years, 30 pack-year currently smoking OR have quit w/in 15years.) does not qualify.      Additional Screening:  Hepatitis C Screening: does qualify; ***  Vision Screening: Recommended annual  ophthalmology exams for early detection of glaucoma and other disorders of the eye. Is the patient up to date with their annual eye exam?  {YES/NO:21197} Who is the provider or what is the name of the office in which the patient attends annual eye exams? *** If pt is not established with a provider, would they like to be referred to a provider to establish care? {YES/NO:21197}.   Dental Screening: Recommended annual dental exams for proper oral hygiene  Community Resource Referral / Chronic Care Management: CRR required this visit?  {YES/NO:21197}  CCM required this visit?  {YES/NO:21197}     Plan:     I have personally reviewed and noted the following in the patient's chart:   . Medical and social history . Use of alcohol, tobacco or illicit drugs  . Current medications and supplements . Functional ability and status . Nutritional status . Physical activity . Advanced directives . List of other physicians . Hospitalizations, surgeries, and ER visits in previous 12 months . Vitals . Screenings to include cognitive, depression, and falls . Referrals and appointments  In addition, I have reviewed and discussed with patient certain preventive protocols, quality metrics, and best practice recommendations. A written personalized care plan for preventive services as well as general preventive health recommendations were provided to patient.   Due to this being a telephonic visit, the after visit summary with patients personalized plan was offered to patient via mail or my-chart. ***Patient declined at this time./ Patient would like to access on my-chart/ per request, patient was mailed a copy of AVS./ Patient preferred to pick up at office at next visit.    Marta Antu, LPN   [id number]  Nurse Health Advisor  Nurse Notes: ***

## 2020-03-12 ENCOUNTER — Ambulatory Visit: Payer: HMO

## 2020-03-13 DIAGNOSIS — C678 Malignant neoplasm of overlapping sites of bladder: Secondary | ICD-10-CM | POA: Diagnosis not present

## 2020-03-13 DIAGNOSIS — R310 Gross hematuria: Secondary | ICD-10-CM | POA: Diagnosis not present

## 2020-03-13 DIAGNOSIS — N400 Enlarged prostate without lower urinary tract symptoms: Secondary | ICD-10-CM | POA: Diagnosis not present

## 2020-03-14 ENCOUNTER — Other Ambulatory Visit: Payer: Self-pay

## 2020-03-18 ENCOUNTER — Telehealth: Payer: Self-pay | Admitting: Interventional Cardiology

## 2020-03-18 NOTE — Telephone Encounter (Signed)
Left message for the patient to call back and speak to the on-call preop APP of the day.  He underwent DES to proximal LAD in March 2020.  Patient was last seen by Dr. Burt Knack on 08/24/2019 at which time he complained of progressive dyspnea and intermittent chest discomfort.  He was subsequently admitted on 08/30/2019, cardiac catheterization showed nonobstructive disease was only 20% distal left main, 25% mid LAD, patent proximal LAD stent, EF 55 to 65%.  He is to be a low risk candidate to proceed with TURBT procedure.  Surgeons office is requested to hold aspirin for 5 days and Plavix for 7 days prior to the procedure.   Dr. Irish Lack to review and see if able to hold DAPT as requested above.  Please forward your response to P CV DIV PREOP

## 2020-03-18 NOTE — Telephone Encounter (Signed)
   [facility] Pre-operative Risk Assessment    HEARTCARE STAFF: - Please ensure there is not already an duplicate clearance open for this procedure. - Under Visit Info/Reason for Call, type in Other and utilize the format Clearance MM/DD/YY or Clearance TBD. Do not use dashes or single digits. - If request is for dental extraction, please clarify the # of teeth to be extracted.  Request for surgical clearance:  1. What type of surgery is being performed? TURBT  2. When is this surgery scheduled? TBD  3. What type of clearance is required (medical clearance vs. Pharmacy clearance to hold med vs. Both)? both  4. Are there any medications that need to be held prior to surgery and how long? Aspirin 5 days prior and plavix 7 days prior  5. Practice name and name of physician performing surgery? [facility], Dr. Harold Barban  6. What is the office phone number? [phone]    7.   What is the office fax number? [phone]  8.   Anesthesia type (None, local, MAC, general) ? General    Leah Newnam 03/18/2020, 4:07 PM  _________________________________________________________________   (provider comments below)

## 2020-03-19 NOTE — Telephone Encounter (Signed)
   Primary Cardiologist: Larae Grooms, MD  Chart reviewed as part of pre-operative protocol coverage. Patient was contacted 03/19/2020 in reference to pre-operative risk assessment for pending surgery as outlined below.  Richard [name] was last seen on 08/24/2019 by Dr. Burt Knack.  Since that day, Richard [name] has done well without any chest pain or shortness of breath. He has underwent cardiac catheterization in June 2021 that showed patent LAD stent, mild residual disease.   This case has been reviewed by Dr. Irish Lack who is okay for the patient to hold aspirin for 5 days and Plavix for 7 days prior to the procedure. We will defer to the surgeon's office to decide on the earliest time to restart the medication after the procedure.  Therefore, based on ACC/AHA guidelines, the patient would be at acceptable risk for the planned procedure without further cardiovascular testing.   The patient was advised that if he develops new symptoms prior to surgery to contact our office to arrange for a follow-up visit, and he verbalized understanding.  I will route this recommendation to the requesting party via Epic fax function and remove from pre-op pool. Please call with questions.  [place], [place] 03/19/2020, 10:54 AM

## 2020-03-19 NOTE — Telephone Encounter (Signed)
OK to hold aspirin for 5 days and Plavix for 7 days prior to the procedure.   JV

## 2020-03-20 ENCOUNTER — Encounter: Payer: Self-pay | Admitting: Family Medicine

## 2020-03-20 ENCOUNTER — Telehealth: Payer: Self-pay | Admitting: Family Medicine

## 2020-03-20 NOTE — Telephone Encounter (Signed)
Encounter opened to do letter of surgical clearance for pt. Signed:  Crissie Sickles, MD           03/20/2020

## 2020-03-26 ENCOUNTER — Encounter: Payer: Self-pay | Admitting: Family Medicine

## 2020-03-26 ENCOUNTER — Ambulatory Visit (INDEPENDENT_AMBULATORY_CARE_PROVIDER_SITE_OTHER): Payer: HMO | Admitting: Family Medicine

## 2020-03-26 ENCOUNTER — Other Ambulatory Visit: Payer: Self-pay

## 2020-03-26 VITALS — BP 131/70 | HR 67 | Temp 98.0°F | Resp 16 | Ht 67.0 in | Wt 248.8 lb

## 2020-03-26 DIAGNOSIS — E7800 Pure hypercholesterolemia, unspecified: Secondary | ICD-10-CM | POA: Diagnosis not present

## 2020-03-26 DIAGNOSIS — E119 Type 2 diabetes mellitus without complications: Secondary | ICD-10-CM | POA: Diagnosis not present

## 2020-03-26 DIAGNOSIS — I10 Essential (primary) hypertension: Secondary | ICD-10-CM | POA: Diagnosis not present

## 2020-03-26 DIAGNOSIS — R310 Gross hematuria: Secondary | ICD-10-CM

## 2020-03-26 DIAGNOSIS — N3289 Other specified disorders of bladder: Secondary | ICD-10-CM

## 2020-03-26 NOTE — Progress Notes (Signed)
OFFICE VISIT  03/26/2020  CC:  Chief Complaint  Patient presents with  . Surgical clearance    HPI:    Patient is a 75 y.o. Caucasian male who presents for medical clearance for upcoming urologic procedure. I last saw him approx 1 mo ago. A/P as of that visit: "1) DM: noncompliant with med last couple weeks.   Feet exam normal today. Hba1c and lytes/cr today. Restart metformin.  2) HTN: good control. Lytes/cr today. Cont toprol, lisin, imdur.  3) HLD: tolerating statin. FLP and hepatic panel today.  4) CAD: asymptomatic. Cont DAPT, BB, statin, ACE-I, imdur.  5) Gross hematuria: no imaging or cystoscopy have been done yet---pt has initial consult appt with urol tomorrow.  6) Health maintenance exam: Reviewed age and gender appropriate health maintenance issues (prudent diet, regular exercise, health risks of tobacco and excessive alcohol, use of seatbelts, fire alarms in home, use of sunscreen).  Also reviewed age and gender appropriate health screening as well as vaccine recommendations. Vaccines: Pt declines flu, covid, and Tdap vaccines.  Shingrix->he declines.  Prevnar 13->declines. Labs: fasting HP, a1c, PSA. Prostate ca screening: PSA today. Colon ca screening: colonoscopy x 2 normal, most recent 2018.  "  INTERIM HX:  Since I last saw him he has seen urology and w/u with CT and cystoscopy has shown a suspected bladder mass->TURBT is next step. Cardiac clearance as of 03/18/20->ok to hold ASA 5d prior and Plavix 7d prior to procedure.  No preop cardiac testing indicated per cards.  Not having any more episodes of gross hematuria.   Has had some recent mild R side pain/soreness.  NOt colocky.  No constip or diarrhea, no nausea. He is eating and drinking fine. Since my last visit with him he has been compliant with all meds (other than stopping his ASA and plavix as instructed by cardiology 03/18/20).   Past Medical History:  Diagnosis Date  . Bladder mass  03/2020   urol to do TURBT  . Coronary artery disease    a. DES to LAD 04/2011 - took Effient/ASA x 1 yr, now on ASA only. b. Cath 10/2012: patent stent, otherwise normal cors. c. 05/2018 Prox LAD stent 75% restenosed, DES placed.  08/30/19 cath: patent stent, mild distal LAD dz not requiring intervention, LVEF nl, EDP normal: maximize antianginal meds.  . Diabetes mellitus with complication ([place]) 16/1096   A1c 6.9%  . Diabetic peripheral neuropathy ([place])   . GERD (gastroesophageal reflux disease)    HAS HAD ESOPHAGUS STRETCHED SEVERAL TIMES IN THE PAST  . Gross hematuria 2021   Bladder tumor noted on CT and cystoscopy.  Urol to do TURBT 03/2020.  Marland Kitchen Hyperlipidemia    goal LDL < 70  . Hypertension   . Myocardial infarction ([place])   . OSA on CPAP 10/18/2011   doesn't wear it  . Right knee pain    TORN RIGHT KNEE MEDIAL MENSICAL TEAR  . Syncope    a. 10/2012.    Past Surgical History:  Procedure Laterality Date  . CARDIAC CATHETERIZATION  06/2017   In-stent restenosis cleared out  . CARDIOVASCULAR STRESS TEST     Latter part of 2019--normal per cardiologist's note from 10/2017  . CERVICAL FUSION  1990 and 1993   X 2   SURGERIES    SLIGHT LIMITATION ROM  . COLONOSCOPY  11/24/2004; 2018   NORMAL.  Recall 2016.  Repeat 2018 normal per pt.  . CORONARY ANGIOPLASTY WITH STENT PLACEMENT    . CORONARY STENT  INTERVENTION N/A 05/22/2018   Prox LAD for in stent restenosis.  DAPT x 1 yr.  Procedure: CORONARY STENT INTERVENTION;  Surgeon: Jettie Booze, MD;  Location: [facility];  Service: Cardiovascular;  Laterality: N/A;  . INTRAVASCULAR ULTRASOUND/IVUS N/A 05/22/2018   Procedure: Intravascular Ultrasound/IVUS;  Surgeon: Jettie Booze, MD;  Location: [facility];  Service: Cardiovascular;  Laterality: N/A;  . KNEE ARTHROSCOPY  10/22/2011   Procedure: ARTHROSCOPY KNEE;  Surgeon: Gearlean Alf, MD;  Location: [facility];  Service: Orthopedics;  Laterality: Right;  Right knee  scope with debridement  . LAPAROSCOPIC CHOLECYSTECTOMY  2018  . LEFT HEART CATH AND CORONARY ANGIOGRAPHY N/A 05/22/2018   DES placed for in stent restenosis.  EF 55-60%.  Procedure: LEFT HEART CATH AND CORONARY ANGIOGRAPHY;  Surgeon: Jettie Booze, MD;  Location: [facility];  Service: Cardiovascular;  Laterality: N/A;  . LEFT HEART CATH AND CORONARY ANGIOGRAPHY N/A 08/30/2019   No change from 2020 cath->patent LAD stent, mild distal LAD dz.  No intervention required. Nitrates + possible future CCB antianginal for poss microvasc dz. Procedure: LEFT HEART CATH AND CORONARY ANGIOGRAPHY;  Surgeon: Jettie Booze, MD;  Location: [facility];  Service: Cardiovascular;  Laterality: N/A;  . LEFT HEART CATHETERIZATION WITH CORONARY ANGIOGRAM N/A 10/08/2012   Procedure: LEFT HEART CATHETERIZATION WITH CORONARY ANGIOGRAM;  Surgeon: Lorretta Harp, MD;  Location: [facility];  Service: Cardiovascular;  Laterality: N/A;  . NASAL SINUS SURGERY     ONE SINUS SURGERY THRU NOSE AND ANOTHER SINUS SURGERY THRU INCISION ABOVE RT EYE  . TRANSTHORACIC ECHOCARDIOGRAM  05/21/2018   EF 60-65%, grd I DD, no valvular probs.    Outpatient Medications Prior to Visit  Medication Sig Dispense Refill  . acetaminophen (TYLENOL) 500 MG tablet Take 1,500 mg by mouth every 6 (six) hours as needed for mild pain or moderate pain (Sinus).    Marland Kitchen Apoaequorin (PREVAGEN) 10 MG CAPS Take 10 mg by mouth daily.    Marland Kitchen atorvastatin (LIPITOR) 80 MG tablet TAKE 1 TABLET (80 MG TOTAL) BY MOUTH DAILY AT 6 PM. 90 tablet 1  . cholecalciferol (VITAMIN D) 1000 UNITS tablet Take 2,000 Units by mouth every evening.    . dicyclomine (BENTYL) 10 MG capsule TAKE 1 CAPSULE (10 MG TOTAL) BY MOUTH 4 (FOUR) TIMES DAILY - BEFORE MEALS AND AT BEDTIME. 360 capsule 1  . escitalopram (LEXAPRO) 10 MG tablet Take 1 tablet (10 mg total) by mouth daily. 90 tablet 3  . fenofibrate (TRICOR) 145 MG tablet Take 1 tablet (145 mg total) by mouth  daily. 90 tablet 3  . furosemide (LASIX) 40 MG tablet Take 40 mg by mouth See admin instructions. Take every  day except: Sunday    . gabapentin (NEURONTIN) 300 MG capsule Take 1 capsule (300 mg total) by mouth 2 (two) times daily. 180 capsule 3  . isosorbide mononitrate (IMDUR) 30 MG 24 hr tablet Take 0.5 tablets (15 mg total) by mouth daily. 45 tablet 3  . lisinopril (ZESTRIL) 40 MG tablet Take 1 tablet (40 mg total) by mouth daily. Please keep upcoming appt in July before anymore refills. Thank you 90 tablet 1  . metFORMIN (GLUCOPHAGE) 500 MG tablet Take 1 tablet (500 mg total) by mouth 2 (two) times daily with a meal.    . metoprolol succinate (TOPROL-XL) 100 MG 24 hr tablet TAKE 1 TABLET (100 MG TOTAL) BY MOUTH DAILY. TAKE WITH OR IMMEDIATELY FOLLOWING A MEAL.  90 tablet 0  . pantoprazole (PROTONIX) 40 MG tablet Take 40 mg by mouth every evening.    . phenylephrine (SINUS RELIEF EXTRA STRENGTH) 1 % nasal spray Place 1 drop into both nostrils every 6 (six) hours as needed for congestion.    . vitamin B-12 (CYANOCOBALAMIN) 1000 MCG tablet Take 1,000 mcg by mouth every other day.    Marland Kitchen aspirin EC 81 MG EC tablet Take 1 tablet (81 mg total) by mouth daily. (Patient not taking: No sig reported)    . clopidogrel (PLAVIX) 75 MG tablet Take 1 tablet (75 mg total) by mouth daily with breakfast. (Patient not taking: Reported on 03/26/2020) 90 tablet 3  . nitroGLYCERIN (NITROSTAT) 0.4 MG SL tablet PLACE 1 TABLET UNDER THE TONGUE EVERY 5 MINUTES X 3 DOSES AS NEEDED FOR CHEST PAIN. (Patient not taking: Reported on 03/26/2020) 25 tablet 6   No facility-administered medications prior to visit.    No Known Allergies  ROS As per HPI  PE: Vitals with BMI 03/26/2020 02/20/2020 01/03/2020  Height 5\' 7"  5\' 7"  5\' 8"   Weight 248 lbs 13 oz 245 lbs 10 oz 246 lbs 10 oz  BMI 38.96 [id number]  Systolic [id number]  Diastolic 70 77 81  Pulse 67 80 60     Gen: Alert, well appearing.  Patient is oriented to  person, place, time, and situation. AFFECT: pleasant, lucid thought and speech. CV: RRR, no m/r/g.   LUNGS: CTA bilat, nonlabored resps, good aeration in all lung fields. ABD: soft, NT/ND EXT: no clubbing or cyanosis.  no edema.    LABS:  Lab Results  Component Value Date   TSH 1.54 08/24/2019   Lab Results  Component Value Date   WBC 6.8 02/20/2020   HGB 14.7 02/20/2020   HCT 43.5 02/20/2020   MCV 95.4 02/20/2020   PLT 184.0 02/20/2020   Lab Results  Component Value Date   CREATININE 1.01 02/20/2020   BUN 13 02/20/2020   NA 142 02/20/2020   K 3.8 02/20/2020   CL 101 02/20/2020   CO2 31 02/20/2020   Lab Results  Component Value Date   ALT 10 02/20/2020   AST 15 02/20/2020   ALKPHOS 53 02/20/2020   BILITOT 0.7 02/20/2020   Lab Results  Component Value Date   CHOL 234 (H) 02/20/2020   Lab Results  Component Value Date   HDL 47.20 02/20/2020   Lab Results  Component Value Date   LDLCALC 27 08/18/2018   Lab Results  Component Value Date   TRIG 275.0 (H) 02/20/2020   Lab Results  Component Value Date   CHOLHDL 5 02/20/2020   Lab Results  Component Value Date   PSA 1.00 02/20/2020   Lab Results  Component Value Date   HGBA1C 6.9 (H) 02/20/2020    IMPRESSION AND PLAN:  1) Gross hematuria, bladder mass detected on w/u, plan for TURBT with . DM, HTN, HLD, CAD all medically stable. Clear for surgery from medical standpoint. Cardiologist has cleared him as well.  An After Visit Summary was printed and given to the patient.  FOLLOW UP: Return for keep appt already set for 05/21/20.  Signed:  Crissie Sickles, MD           03/26/2020

## 2020-03-28 ENCOUNTER — Other Ambulatory Visit: Payer: Self-pay | Admitting: Urology

## 2020-04-04 ENCOUNTER — Other Ambulatory Visit (HOSPITAL_COMMUNITY)
Admission: RE | Admit: 2020-04-04 | Discharge: 2020-04-04 | Disposition: A | Discharge status: Home / Self Care | Payer: HMO | Source: Ambulatory Visit | Attending: Urology | Admitting: Urology

## 2020-04-04 DIAGNOSIS — U071 COVID-19: Secondary | ICD-10-CM | POA: Diagnosis not present

## 2020-04-04 DIAGNOSIS — Z01812 Encounter for preprocedural laboratory examination: Secondary | ICD-10-CM | POA: Diagnosis not present

## 2020-04-04 HISTORY — DX: COVID-19: U07.1

## 2020-04-04 LAB — SARS CORONAVIRUS 2 (TAT 6-24 HRS): SARS Coronavirus 2: POSITIVE — AB

## 2020-04-04 NOTE — Progress Notes (Addendum)
Received call from front desk at [facility] that this pt covid test came back positive and case is being moved to bepot, surgery was on 04-08-2020.  Called and left message for Coni, OR scheduler, informed her of pt's positive covid test , case is in depot until can get rescheduled, and that pt has been called and informed of what to do.  Called and spoke w/ pt via phone and informed him of his covid test being positive and his surgery will be rescheduled.  Pt verbalized understanding to stay quarantined, call his pcp office inform them of positive covid test they will let him know what to do if he becomes symptomatic, and if becomes sob go to ED.

## 2020-04-05 ENCOUNTER — Telehealth: Payer: Self-pay | Admitting: Infectious Diseases

## 2020-04-05 NOTE — Progress Notes (Signed)
Patient with positive covid result. MD aware of results   

## 2020-04-05 NOTE — Telephone Encounter (Signed)
Called to discuss with patient about COVID-19 symptoms and the use of one of the available treatments for those with mild to moderate Covid symptoms and at a high risk of hospitalization.  Pt appears to qualify for outpatient treatment due to co-morbid conditions and/or a member of an at-risk group in accordance with the FDA Emergency Use Authorization.    Symptom onset: unclear if he has symptoms  Vaccinated: not documented  Booster? Not documented  Immunocompromised? Not known Qualifiers: age, smoker, DM   Unable to reach pt - LVM to call back re: discussion about symptoms and candidacy.   Janene Madeira

## 2020-04-08 DIAGNOSIS — Z8551 Personal history of malignant neoplasm of bladder: Secondary | ICD-10-CM

## 2020-04-08 HISTORY — DX: Personal history of malignant neoplasm of bladder: Z85.51

## 2020-04-15 ENCOUNTER — Other Ambulatory Visit: Payer: Self-pay | Admitting: Family Medicine

## 2020-04-18 ENCOUNTER — Encounter (HOSPITAL_BASED_OUTPATIENT_CLINIC_OR_DEPARTMENT_OTHER): Payer: Self-pay | Admitting: Urology

## 2020-04-18 ENCOUNTER — Other Ambulatory Visit: Payer: Self-pay

## 2020-04-18 NOTE — Progress Notes (Signed)
Spoke w/ via phone for pre-op interview---pt daughter pam hooker  Lab needs dos---- I stat  COVID test ------positive covid 04-04-2020 in epic Arrive at -------800 am 04-22-2020 NPO after MN NO Solid Food.   Water from MN until---700 am then npo Medications to take morning of surgery -----isosoorbide mononitrate, gabapentin, escitalopram, pantaprazole Diabetic medication -----none day of surgery Patient Special Instructions -----none Pre-Op special Istructions ----- Patient verbalized understanding of instructions that were given at this phone interview. Patient denies shortness of breath, chest pain, fever, cough at this phone interview.  Anesthesia Review: pt denies nitro use in at least a year, pt denies chest pain sob or any cardiac symptoms at pre op call, has cardiac clearance note 03-19-2020 hao mang pa epic/on chart, has medical clearance note dr Ernestine Conrad 03-20-2020 on chart  PCP: dr Mikle Bosworth Cardiologist : dr Irish Lack  Chest x-ray :05-19-2018 epic EKG :08-24-2019 epic/chart Echo :05-21-2018 epic Stress test:none Cardiac Cath : last done 08-30-2019 epic Activity level: does housework and yard work without problems Sleep Study/ CPAP :has osa no cpap used Fasting Blood Sugar :      / Checks Blood Sugar -- times a day:  Does not check cbg Blood Thinner/ Instructions /Last Dose: note to stop aspirin x 5 days and plavix x 7 days per hao meng pa 03-19-2020 note epic/chart   Bring daughter pam hooker back to pre op pt poor historian with meds and last time taken

## 2020-04-21 NOTE — H&P (Signed)
HPI: Patient is a 75 year old white male seen today for evaluation of gross painless hematuria which he noted initially back on 12/24/2019. Patient says hematuria was painless and persisted for almost a week. Based on the history he states that sometimes it was mainly terminal hematuria and not throughout the entire stream him thing that it might be prostate source. He has denied any flank pain nausea vomiting or history of stones. He is a nonsmoker. Patient is on Plavix for cardiac stent. He has not noted no recent gross hematuria since October.  Micro urinalysis today shows 40-60 RBCs  -03/13/20-patient with history of gross hematuria as above. Has undergone CT renal scan on 03/10/2020 which appears to show a possible or right-sided bladder lesion somewhat exophytic. Also has at 2.4 x 2.1 cm right adrenal nodule which is consistent with an adenoma. He is here for cysto to assess bladder. CT report is as below.  Cysto performed today and shows: Approximate 2 cm exophytic papillary tumor emanating from the right floor lateral bladder wall superior lateral to the right ureteral orifice. No other mucosal lesions noted.  CLINICAL DATA: Gross and microhematuria for 1 week approximately 2  months ago.   EXAM:  CT ABDOMEN AND PELVIS WITHOUT AND WITH CONTRAST   TECHNIQUE:  Multidetector CT imaging of the abdomen and pelvis was performed  following the standard protocol before and following the bolus  administration of intravenous contrast.   CONTRAST: 125 cc Omnipaque 300   COMPARISON: None.   FINDINGS:  Lower chest: Tiny perifissural nodules along the minor fissure are  likely lymph nodes. There is a 2 mm right lower lobe nodule on image  6/3, also highly likely to be benign and incidental. The lung bases  are otherwise clear. Mild aortic and coronary artery  atherosclerosis.   Hepatobiliary: The liver is normal in density without suspicious  focal abnormality. No biliary dilatation post  cholecystectomy.   Pancreas: Unremarkable. No pancreatic ductal dilatation or  surrounding inflammatory changes.   Spleen: Normal in size without focal abnormality.   Adrenals/Urinary Tract: There is a 2.4 x 2.1 cm left adrenal nodule  which measures -15 HU on the precontrast images, consistent with an  adenoma. The right adrenal gland appears normal. Pre-contrast images  demonstrate no renal, ureteral or bladder calculi. Post-contrast,  both kidneys enhance normally. There is no evidence of enhancing  renal mass. There is a 12 mm low-density lesion posteriorly in the  interpolar region of the left kidney on image 40/8. This appears  well circumscribed and measures 17 HU on the reformatted images,  consistent with a cyst. Delayed images result in segmental  visualization of the ureters. No focal upper tract urothelial  abnormalities are identified. There is a possible small linear  filling defect within the bladder superior and anterior to the right  ureterovesical junction which is not clearly explained by the  ureter, best seen on the reformatted images of the delayed series.  This could reflect a small bladder lesion which is not well  evaluated on the other sequences due to limited distention of the  bladder. Further evaluation with cystoscopy recommended.   Stomach/Bowel: The stomach appears unremarkable for its degree of  distension. No evidence of bowel wall thickening, distention or  surrounding inflammatory change. The appendix appears normal.  Minimal distal colonic diverticulosis.   Vascular/Lymphatic: There are no enlarged abdominal or pelvic lymph  nodes. There are scattered small retroperitoneal and pelvic lymph  nodes. Mild aortic and branch vessel atherosclerosis  without acute  vascular findings. Retroaortic left renal vein noted.   Reproductive: The prostate gland and seminal vesicles appear normal.   Other: Small right inguinal hernia containing only fat.  Otherwise  intact abdominal wall. No ascites.   Musculoskeletal: No acute or significant osseous findings. Prominent  facet arthropathy in the lower lumbar spine with a degenerative  grade 1 anterolisthesis and moderate to severe biforaminal narrowing  at L4-5 (L5 transitional).   IMPRESSION:  1. Possible small right-sided bladder lesion, potentially neoplastic  in this patient with hematuria. Further evaluation with cystoscopy  recommended.  2. No other explanation for hematuria identified. No evidence of  urinary tract calculus, enhancing renal mass or focal upper tract  urothelial lesion.  3. Left adrenal adenoma.  4. Lumbar spondylosis.  5. Aortic Atherosclerosis (ICD10-I70.0).    Electronically Signed  By: Richardean Sale M.D.  On: 03/10/2020 09:19  A        ALLERGIES: None   MEDICATIONS: Aspirin 81 mg tablet,chewable  Lisinopril 40 mg tablet  Metoprolol Succinate 100 mg tablet, extended release 24 hr  Atorvastatin Calcium 80 mg tablet  Cholecal Df  Clopidogrel 75 mg tablet  Cyanocobalamin  Dicyclomine Hcl 10 mg capsule  Escitalopram Oxalate 10 mg tablet  Fenofibrate 145 mg tablet  Furosemide 40 mg tablet  Gabapentin 300 mg capsule  Isosorbide Dinitrate 30 mg tablet  Metformin Er Gastric 500 mg tablet, er gastric retention 24 hr  Nitroglycerin 0.4 mg tablet, sublingual  Pantoprazole Sodium 40 mg tablet, delayed release  Phenylephrine Hcl     GU PSH: Locm 300-399Mg /Ml Iodine,1Ml - 03/10/2020     NON-GU PSH: None   GU PMH: Gross hematuria - 03/10/2020, - 02/21/2020 BPH w/o LUTS - 02/21/2020 Microscopic hematuria - 02/21/2020    NON-GU PMH: Arthritis Hypercholesterolemia Hypertension    FAMILY HISTORY: None   SOCIAL HISTORY: Marital Status: Married Preferred Language: English; Ethnicity: Not Hispanic Or Latino; Race: White Current Smoking Status: Patient has never smoked.   Tobacco Use Assessment Completed: Used Tobacco in last 30 days? Does not  drink anymore.  Drinks 2 caffeinated drinks per day.    REVIEW OF SYSTEMS:    GU Review Male:   Patient reports get up at night to urinate. Patient denies frequent urination, hard to postpone urination, burning/ pain with urination, leakage of urine, stream starts and stops, trouble starting your stream, have to strain to urinate , erection problems, and penile pain.  Gastrointestinal (Upper):   Patient denies nausea, vomiting, and indigestion/ heartburn.  Gastrointestinal (Lower):   Patient denies diarrhea and constipation.  Constitutional:   Patient denies fever, night sweats, weight loss, and fatigue.  Skin:   Patient reports itching. Patient denies skin rash/ lesion.  Eyes:   Patient denies blurred vision and double vision.  Ears/ Nose/ Throat:   Patient denies sore throat and sinus problems.  Hematologic/Lymphatic:   Patient reports swollen glands. Patient denies easy bruising.  Cardiovascular:   Patient denies leg swelling and chest pains.  Respiratory:   Patient denies cough and shortness of breath.  Endocrine:   Patient denies excessive thirst.  Musculoskeletal:   Patient reports back pain and joint pain.   Neurological:   Patient denies headaches and dizziness.  Psychologic:   Patient denies depression and anxiety.   VITAL SIGNS:      03/13/2020 10:08 AM  Weight 245 lb / 111.13 kg  Height 68 in / 172.72 cm  BP 150/100 mmHg  Pulse 78 /min  Temperature 98.0 F /  36.6 C  BMI 37.2 kg/m   GU PHYSICAL EXAMINATION:    Anus and Perineum: No hemorrhoids. No anal stenosis. No rectal fissure, no anal fissure. No edema, no dimple, no perineal tenderness, no anal tenderness.  Scrotum: No lesions. No edema. No cysts. No warts.  Epididymides: Right: no spermatocele, no masses, no cysts, no tenderness, no induration, no enlargement. Left: no spermatocele, no masses, no cysts, no tenderness, no induration, no enlargement.  Testes: No tenderness, no swelling, no enlargement left testes. No  tenderness, no swelling, no enlargement right testes. Normal location left testes. Normal location right testes. No mass, no cyst, no varicocele, no hydrocele left testes. No mass, no cyst, no varicocele, no hydrocele right testes.  Urethral Meatus: Normal size. No lesion, no wart, no discharge, no polyp. Normal location.  Penis: Circumcised, no warts, no cracks. No dorsal Peyronie's plaques, no left corporal Peyronie's plaques, no right corporal Peyronie's plaques, no scarring, no warts. No balanitis, no meatal stenosis.  Prostate: 40 gram or 2+ size. Left lobe normal consistency, right lobe normal consistency. Symmetrical lobes. No prostate nodule. Left lobe no tenderness, right lobe no tenderness.  Seminal Vesicles: Nonpalpable.  Sphincter Tone: Normal sphincter. No rectal tenderness. No rectal mass.    MULTI-SYSTEM PHYSICAL EXAMINATION:    Constitutional: Well-nourished. No physical deformities. Normally developed. Good grooming.  Neck: Neck symmetrical, not swollen. Normal tracheal position.  Respiratory: No labored breathing, no use of accessory muscles.   Cardiovascular: Normal temperature, normal extremity pulses, no swelling, no varicosities.  Lymphatic: No enlargement of neck, axillae, groin.  Skin: No paleness, no jaundice, no cyanosis. No lesion, no ulcer, no rash.  Neurologic / Psychiatric: Oriented to time, oriented to place, oriented to person. No depression, no anxiety, no agitation.  Gastrointestinal: No mass, no tenderness, no rigidity, non obese abdomen.  Eyes: Normal conjunctivae. Normal eyelids.  Ears, Nose, Mouth, and Throat: Left ear no scars, no lesions, no masses. Right ear no scars, no lesions, no masses. Nose no scars, no lesions, no masses. Normal hearing. Normal lips.  Musculoskeletal: Normal gait and station of head and neck.     PAST DATA REVIEW: None   PROCEDURES:         Flexible Cystoscopy - 52000  Risks, benefits, and some of the potential complications of  the procedure were discussed at length with the patient including infection, bleeding, voiding discomfort, urinary retention, fever, chills, sepsis, and others. All questions were answered. Informed consent was obtained. Antibiotic prophylaxis was given. Sterile technique and intraurethral analgesia were used.  Meatus:  Normal size. Normal location. Normal condition.  Urethra:  No strictures.  External Sphincter:  Normal.  Verumontanum:  Normal.  Prostate:  Borderline obstructing. Moderate hyperplasia.  Bladder Neck:  Non-obstructing.  Ureteral Orifices:  Normal location. Normal size. Normal shape. Effluxed clear urine.  Bladder:  Cysto performed today and shows: Approximate 2 cm exophytic papillary tumor emanating from the right floor lateral bladder wall superior lateral to the right ureteral orifice. No other mucosal lesions noted.      The lower urinary tract was carefully examined. The procedure was well-tolerated and without complications. Antibiotic instructions were given. Instructions were given to call the office immediately for bloody urine, difficulty urinating, urinary retention, painful or frequent urination, fever, chills, nausea, vomiting or other illness. The patient stated that he understood these instructions and would comply with them.         Urinalysis w/Scope - 81001 Dipstick Dipstick Cont'd Micro  Color: Yellow Bilirubin: Neg  mg/dL WBC/hpf: 0 - 5/hpf  Appearance: Clear Ketones: Neg mg/dL RBC/hpf: 20 - 40/hpf  Specific Gravity: 1.030 Blood: 3+ ery/uL Bacteria: Rare (0-9/hpf)  pH: 5.5 Protein: 1+ mg/dL Cystals: NS (Not Seen)  Glucose: Neg mg/dL Urobilinogen: 0.2 mg/dL Casts: Hyaline    Nitrites: Neg Trichomonas: Not Present    Leukocyte Esterase: Neg leu/uL Mucous: Present      Epithelial Cells: NS (Not Seen)      Yeast: NS (Not Seen)      Sperm: Not Present    ASSESSMENT:      ICD-10 Details  1 GU:   Gross hematuria - S81.1 Acute, Complicated Injury  2   BPH w/o  LUTS - N40.0 Chronic, Stable  3   Bladder Cancer overlapping sites - S31.5 Acute, Complicated Injury   PLAN:           Document Letter(s):  Created for Patient: Clinical Summary         Notes:   I discussed cystoscopic findings with the patient and his wife today. Recommended cysto bilateral retrogrades and TURBT. He will need to be cleared from cardiology standpoint is he is on Plavix having had cardiac stents in the past. Will schedule accordingly in the near future risks and benefits discussed as outlined below.  TURBT consent: I have discussed with the patient the risks, benefits of TURBT which include but are not limited to: Bleeding, infection, damage to the bladder with potential perforation of the bladder, damage to surrounding organs, possible need for further procedures including open repair and catheterization, possibility of nonhealing area within the bladder, urgency, frequency which may be refractory to medications. I pointed out that in some occasions after resection of the bladder tumor, mitomycin-C chemotherapy may be instilled into the bladder. The risks associated with this therapy include but are not limited to: Refractory or new onset urgency, frequency, dysuria, infrequently severe systemic side effects secondary to mitomycin-C. After full discussion of the risks, benefits and alternatives, the patient has consented to the above procedure and desires to proceed.

## 2020-04-22 ENCOUNTER — Ambulatory Visit (HOSPITAL_BASED_OUTPATIENT_CLINIC_OR_DEPARTMENT_OTHER)
Admission: RE | Admit: 2020-04-22 | Discharge: 2020-04-22 | Disposition: A | Discharge status: Home / Self Care | Payer: HMO | Attending: Urology | Admitting: Urology

## 2020-04-22 ENCOUNTER — Encounter (HOSPITAL_BASED_OUTPATIENT_CLINIC_OR_DEPARTMENT_OTHER): Payer: Self-pay | Admitting: Urology

## 2020-04-22 ENCOUNTER — Ambulatory Visit (HOSPITAL_BASED_OUTPATIENT_CLINIC_OR_DEPARTMENT_OTHER): Payer: HMO | Admitting: Anesthesiology

## 2020-04-22 ENCOUNTER — Encounter (HOSPITAL_BASED_OUTPATIENT_CLINIC_OR_DEPARTMENT_OTHER)
Admission: RE | Disposition: A | Discharge status: Home / Self Care | Payer: Self-pay | Source: Home / Self Care | Attending: Urology

## 2020-04-22 DIAGNOSIS — N401 Enlarged prostate with lower urinary tract symptoms: Secondary | ICD-10-CM | POA: Insufficient documentation

## 2020-04-22 DIAGNOSIS — C679 Malignant neoplasm of bladder, unspecified: Secondary | ICD-10-CM | POA: Diagnosis not present

## 2020-04-22 DIAGNOSIS — G4733 Obstructive sleep apnea (adult) (pediatric): Secondary | ICD-10-CM | POA: Diagnosis not present

## 2020-04-22 DIAGNOSIS — Z955 Presence of coronary angioplasty implant and graft: Secondary | ICD-10-CM | POA: Diagnosis not present

## 2020-04-22 DIAGNOSIS — R310 Gross hematuria: Secondary | ICD-10-CM | POA: Insufficient documentation

## 2020-04-22 DIAGNOSIS — D494 Neoplasm of unspecified behavior of bladder: Secondary | ICD-10-CM | POA: Diagnosis not present

## 2020-04-22 DIAGNOSIS — Z9989 Dependence on other enabling machines and devices: Secondary | ICD-10-CM | POA: Diagnosis not present

## 2020-04-22 DIAGNOSIS — I10 Essential (primary) hypertension: Secondary | ICD-10-CM | POA: Diagnosis not present

## 2020-04-22 HISTORY — PX: CYSTOSCOPY W/ RETROGRADES: SHX1426

## 2020-04-22 HISTORY — DX: Type 2 diabetes mellitus without complications: E11.9

## 2020-04-22 HISTORY — DX: Malignant (primary) neoplasm, unspecified: C80.1

## 2020-04-22 HISTORY — PX: TRANSURETHRAL RESECTION OF BLADDER TUMOR: SHX2575

## 2020-04-22 HISTORY — DX: Other specified health status: Z78.9

## 2020-04-22 HISTORY — DX: Presence of spectacles and contact lenses: Z97.3

## 2020-04-22 HISTORY — DX: Presence of dental prosthetic device (complete) (partial): Z97.2

## 2020-04-22 LAB — POCT I-STAT, CHEM 8
BUN: 14 mg/dL (ref 8–23)
Calcium, Ion: 1.3 mmol/L (ref 1.15–1.40)
Chloride: 105 mmol/L (ref 98–111)
Creatinine, Ser: 1.1 mg/dL (ref 0.61–1.24)
Glucose, Bld: 131 mg/dL — ABNORMAL HIGH (ref 70–99)
HCT: 42 % (ref 39.0–52.0)
Hemoglobin: 14.3 g/dL (ref 13.0–17.0)
Potassium: 4.1 mmol/L (ref 3.5–5.1)
Sodium: 143 mmol/L (ref 135–145)
TCO2: 27 mmol/L (ref 22–32)

## 2020-04-22 LAB — GLUCOSE, CAPILLARY: Glucose-Capillary: 132 mg/dL — ABNORMAL HIGH (ref 70–99)

## 2020-04-22 SURGERY — CYSTOSCOPY, WITH RETROGRADE PYELOGRAM
Anesthesia: General | Site: Pelvis

## 2020-04-22 MED ORDER — OXYCODONE HCL 5 MG PO TABS
5.0000 mg | ORAL_TABLET | Freq: Once | ORAL | Status: DC | PRN
Start: 2020-04-22 — End: 2020-04-22

## 2020-04-22 MED ORDER — ONDANSETRON HCL 4 MG/2ML IJ SOLN: 4.0000 mg | Freq: Once | INTRAMUSCULAR | Status: DC | PRN

## 2020-04-22 MED ORDER — ONDANSETRON HCL 4 MG/2ML IJ SOLN
INTRAMUSCULAR | Status: AC
  Filled 2020-04-22: qty 2

## 2020-04-22 MED ORDER — FENTANYL CITRATE (PF) 100 MCG/2ML IJ SOLN
INTRAMUSCULAR | Status: AC
  Filled 2020-04-22: qty 2

## 2020-04-22 MED ORDER — STERILE WATER FOR IRRIGATION IR SOLN
Status: DC | PRN
  Administered 2020-04-22: 1500 mL

## 2020-04-22 MED ORDER — MEPERIDINE HCL 25 MG/ML IJ SOLN: 6.2500 mg | INTRAMUSCULAR | Status: DC | PRN

## 2020-04-22 MED ORDER — IOHEXOL 300 MG/ML  SOLN
INTRAMUSCULAR | Status: DC | PRN
  Administered 2020-04-22: 13 mL via URETHRAL

## 2020-04-22 MED ORDER — PROPOFOL 10 MG/ML IV BOLUS
INTRAVENOUS | Status: DC | PRN
  Administered 2020-04-22: 180 mg via INTRAVENOUS

## 2020-04-22 MED ORDER — TRAMADOL HCL 50 MG PO TABS: 50.0000 mg | ORAL_TABLET | Freq: Four times a day (QID) | ORAL | 0 refills | Status: DC | PRN

## 2020-04-22 MED ORDER — LIDOCAINE HCL (PF) 2 % IJ SOLN
INTRAMUSCULAR | Status: AC
  Filled 2020-04-22: qty 5

## 2020-04-22 MED ORDER — LACTATED RINGERS IV SOLN: INTRAVENOUS | Status: DC

## 2020-04-22 MED ORDER — LIDOCAINE 2% (20 MG/ML) 5 ML SYRINGE
INTRAMUSCULAR | Status: DC | PRN
  Administered 2020-04-22: 80 mg via INTRAVENOUS

## 2020-04-22 MED ORDER — ONDANSETRON HCL 4 MG/2ML IJ SOLN
INTRAMUSCULAR | Status: DC | PRN
  Administered 2020-04-22: 4 mg via INTRAVENOUS

## 2020-04-22 MED ORDER — CEFAZOLIN SODIUM-DEXTROSE 2-4 GM/100ML-% IV SOLN
INTRAVENOUS | Status: AC
  Filled 2020-04-22: qty 100

## 2020-04-22 MED ORDER — FENTANYL CITRATE (PF) 100 MCG/2ML IJ SOLN
25.0000 ug | INTRAMUSCULAR | Status: DC | PRN
  Administered 2020-04-22 (×2): 25 ug via INTRAVENOUS

## 2020-04-22 MED ORDER — FENTANYL CITRATE (PF) 250 MCG/5ML IJ SOLN
INTRAMUSCULAR | Status: DC | PRN
  Administered 2020-04-22 (×4): 25 ug via INTRAVENOUS

## 2020-04-22 MED ORDER — DEXAMETHASONE SODIUM PHOSPHATE 10 MG/ML IJ SOLN
INTRAMUSCULAR | Status: AC
  Filled 2020-04-22: qty 1

## 2020-04-22 MED ORDER — BELLADONNA ALKALOIDS-OPIUM 16.2-60 MG RE SUPP
RECTAL | Status: AC
  Filled 2020-04-22: qty 1

## 2020-04-22 MED ORDER — ACETAMINOPHEN 325 MG PO TABS: 325.0000 mg | ORAL_TABLET | ORAL | Status: DC | PRN

## 2020-04-22 MED ORDER — BELLADONNA ALKALOIDS-OPIUM 16.2-60 MG RE SUPP
RECTAL | Status: DC | PRN
  Administered 2020-04-22: 1 via RECTAL

## 2020-04-22 MED ORDER — PROPOFOL 10 MG/ML IV BOLUS
INTRAVENOUS | Status: AC
  Filled 2020-04-22: qty 20

## 2020-04-22 MED ORDER — OXYCODONE HCL 5 MG/5ML PO SOLN: 5.0000 mg | Freq: Once | ORAL | Status: DC | PRN

## 2020-04-22 MED ORDER — ACETAMINOPHEN 160 MG/5ML PO SOLN: 325.0000 mg | ORAL | Status: DC | PRN

## 2020-04-22 MED ORDER — DEXAMETHASONE SODIUM PHOSPHATE 10 MG/ML IJ SOLN
INTRAMUSCULAR | Status: DC | PRN
  Administered 2020-04-22: 10 mg via INTRAVENOUS

## 2020-04-22 MED ORDER — CEFAZOLIN SODIUM-DEXTROSE 2-4 GM/100ML-% IV SOLN
2.0000 g | INTRAVENOUS | Status: AC
  Administered 2020-04-22: 2 g via INTRAVENOUS

## 2020-04-22 SURGICAL SUPPLY — 34 items
BAG DRAIN URO-CYSTO SKYTR STRL (DRAIN) ×3 IMPLANT
BAG DRN RND TRDRP ANRFLXCHMBR (UROLOGICAL SUPPLIES)
BAG DRN UROCATH (DRAIN) ×2
BAG URINE DRAIN 2000ML AR STRL (UROLOGICAL SUPPLIES) IMPLANT
BAG URINE LEG 500ML (DRAIN) IMPLANT
BULB IRRIG PATHFIND (MISCELLANEOUS) IMPLANT
CATH FOLEY 2WAY SLVR  5CC 20FR (CATHETERS)
CATH FOLEY 2WAY SLVR  5CC 22FR (CATHETERS)
CATH FOLEY 2WAY SLVR 5CC 20FR (CATHETERS) IMPLANT
CATH FOLEY 2WAY SLVR 5CC 22FR (CATHETERS) IMPLANT
CATH URET 5FR 28IN CONE TIP (BALLOONS)
CATH URET 5FR 28IN OPEN ENDED (CATHETERS) ×1 IMPLANT
CATH URET 5FR 70CM CONE TIP (BALLOONS) IMPLANT
CLOTH BEACON ORANGE TIMEOUT ST (SAFETY) ×3 IMPLANT
ELECT REM PT RETURN 9FT ADLT (ELECTROSURGICAL) ×3
ELECTRODE REM PT RTRN 9FT ADLT (ELECTROSURGICAL) ×2 IMPLANT
EVACUATOR MICROVAS BLADDER (UROLOGICAL SUPPLIES) IMPLANT
FIBER LASER FLEXIVA 365 (UROLOGICAL SUPPLIES) IMPLANT
GLOVE SURG ENC MOIS LTX SZ7.5 (GLOVE) ×3 IMPLANT
GOWN STRL REUS W/TWL LRG LVL3 (GOWN DISPOSABLE) ×3 IMPLANT
GUIDEWIRE ANG ZIPWIRE 038X150 (WIRE) IMPLANT
GUIDEWIRE STR DUAL SENSOR (WIRE) ×1 IMPLANT
HOLDER FOLEY CATH W/STRAP (MISCELLANEOUS) IMPLANT
KIT TURNOVER CYSTO (KITS) ×3 IMPLANT
LOOP MONOPOLAR YLW (ELECTROSURGICAL) IMPLANT
MANIFOLD NEPTUNE II (INSTRUMENTS) ×1 IMPLANT
PACK CYSTO (CUSTOM PROCEDURE TRAY) ×3 IMPLANT
PLUG CATH AND CAP STER (CATHETERS) IMPLANT
SYR 20ML LL LF (SYRINGE) ×4 IMPLANT
SYR TOOMEY IRRIG 70ML (MISCELLANEOUS)
SYRINGE TOOMEY IRRIG 70ML (MISCELLANEOUS) IMPLANT
TRACTIP FLEXIVA PULS ID 200XHI (Laser) IMPLANT
TRACTIP FLEXIVA PULSE ID 200 (Laser)
TUBE CONNECTING 12X1/4 (SUCTIONS) ×1 IMPLANT

## 2020-04-22 NOTE — Op Note (Signed)
Preoperative diagnosis:  1.  Bladder cancer  Postoperative diagnosis: 1.  Same  Procedure(s): 1.  Cystoscopy, bilateral retrograde pyelogram with intraoperative interpretation, transurethral section of bladder tumor (medium size)  Surgeon: Dr. Harold Barban  Anesthesia: General  Complications: None  EBL: Minimal  Specimens: Bladder tumor  Disposition of specimens: To pathology  Intraoperative findings: Exophytic papillary tumor emanating from the right lateral floor the bladder just superior lateral to the right ureteral orifice but not involving the ureteral orifice.  Size the tumor was about 2 cm.  Tumors were removed with rigid biopsy forceps to preserve pathologic integrity.  Base was cauterized.  No remaining tumor visible.  Bilateral retrogrades were normal  Indication: Patient presented with microhematuria and during evaluation was found to have 2 cm papillary tumor in the right floor of the bladder.  Description of procedure:  After obtaining informed consent for the patient he was taken the major cystoscopy suite placed under general anesthesia.  Placed in the dorsolithotomy position genitalia prepped and draped in usual sterile fashion.  Proper pause and timeout was performed.  48 French cystoscope was advanced into the bladder under direct vision.  Patient had mild prostatic hypertrophy with slightly elevated bladder neck.  Bladder was thoroughly spread with 30 and 70 degree lenses.  Papillary tumor was noted in the right floor the bladder just superior lateral to the right ureteral orifice.  This was very exophytic with a small attachment to the bladder wall.  In order to preserve pathologic integrity it was felt that these would best be removed with rigid biopsy forceps rather than loop resection.  Utilizing the rigid biopsy forceps these 2 lesions were removed completely including some muscle in the specimen visually.  The Bugbee electrode was then utilized to cauterize the  base of this area with good hemostasis noted.  Attention was then directed towards retrograde pyelogram.  5 French open tip catheter was utilized to cannulate the left ureteral orifice.  Retrograde pyelogram was performed revealing normal filling of the upper tract without evidence of filling defect or hydronephrosis.  Left upper tract emptied out promptly upon removal of the retrograde catheter.  In similar fashion right ureter orifice was cannulated with similar findings and no evidence of filling defect or hydronephrosis.  Both upper tracts again emptied out promptly upon removal of the retrograde catheter.  Bladder was emptied and the area of resection was again inspected with good hemostasis.  The scope was removed.  Procedure was terminated he was awakened from anesthesia and taken back to the recovery room in stable condition.  No immediate complication from the procedure.  B&O suppository was placed at termination of the case for postoperative comfort.

## 2020-04-22 NOTE — Interval H&P Note (Signed)
History and Physical Interval Note:  04/22/2020 9:41 AM  Richard [name]  has presented today for surgery, with the diagnosis of BLADDER CANCER.  The various methods of treatment have been discussed with the patient and family. After consideration of risks, benefits and other options for treatment, the patient has consented to  Procedure(s) with comments: CYSTOSCOPY WITH RETROGRADE PYELOGRAM (Bilateral) - 30 MINS TRANSURETHRAL RESECTION OF BLADDER TUMOR (TURBT) (N/A) as a surgical intervention.  The patient's history has been reviewed, patient examined, no change in status, stable for surgery.  I have reviewed the patient's chart and labs.  Questions were answered to the patient's satisfaction.     Remi Haggard

## 2020-04-22 NOTE — Discharge Instructions (Signed)

## 2020-04-22 NOTE — Transfer of Care (Signed)
Immediate Anesthesia Transfer of Care Note  Patient: Richard [name]  Procedure(s) Performed: CYSTOSCOPY WITH RETROGRADE PYELOGRAM (Bilateral Pelvis) TRANSURETHRAL RESECTION OF BLADDER TUMOR (TURBT); FULGERATION (N/A Bladder)  Patient Location: PACU  Anesthesia Type:General  Level of Consciousness: awake, alert , oriented and patient cooperative  Airway & Oxygen Therapy: Patient Spontanous Breathing and Patient connected to face mask oxygen  Post-op Assessment: Report given to RN, Post -op Vital signs reviewed and stable and Patient moving all extremities  Post vital signs: Reviewed and stable  Last Vitals:  Vitals Value Taken Time  BP 178/92 04/22/20 1027  Temp    Pulse 63 04/22/20 1030  Resp 14 04/22/20 1030  SpO2 98 % 04/22/20 1030  Vitals shown include unvalidated device data.  Last Pain:  Vitals:   04/22/20 0807  TempSrc: Oral  PainSc: 0-No pain      Patients Stated Pain Goal: 4 (97/95/36 9223)  Complications: No complications documented.

## 2020-04-22 NOTE — Anesthesia Preprocedure Evaluation (Addendum)
Anesthesia Evaluation  Patient identified by MRN, date of birth, ID band Patient awake    Reviewed: Allergy & Precautions, H&P , NPO status , Patient's Chart, lab work & pertinent test results  Airway Mallampati: III  TM Distance: <3 FB Neck ROM: Full    Dental no notable dental hx. (+) Edentulous Upper, Edentulous Lower   Pulmonary sleep apnea and Continuous Positive Airway Pressure Ventilation ,    Pulmonary exam normal breath sounds clear to auscultation       Cardiovascular hypertension, Pt. on medications + CAD and + Cardiac Stents  Normal cardiovascular exam Rhythm:Regular Rate:Normal  DES to LAD 04/2011 -  Cath 10/2012: patent stent, otherwise normal cors. c. 05/2018 Prox LAD stent 75% restenosed, DES placed.  08/30/19 cath: patent stent, mild distal LAD dz not requiring intervention, LVEF nl, EDP normal:   Neuro/Psych negative neurological ROS  negative psych ROS   GI/Hepatic negative GI ROS, Neg liver ROS,   Endo/Other  negative endocrine ROSdiabetesMorbid obesity  Renal/GU negative Renal ROS  negative genitourinary   Musculoskeletal negative musculoskeletal ROS (+)   Abdominal (+) + obese,   Peds negative pediatric ROS (+)  Hematology negative hematology ROS (+)   Anesthesia Other Findings   Reproductive/Obstetrics negative OB ROS                            Anesthesia Physical  Anesthesia Plan  ASA: III  Anesthesia Plan: General   Post-op Pain Management:    Induction: Intravenous  PONV Risk Score and Plan: 2 and Ondansetron and Dexamethasone  Airway Management Planned: LMA  Additional Equipment:   Intra-op Plan:   Post-operative Plan:   Informed Consent: I have reviewed the patients History and Physical, chart, labs and discussed the procedure including the risks, benefits and alternatives for the proposed anesthesia with the patient or authorized representative who  has indicated his/her understanding and acceptance.     Dental advisory given  Plan Discussed with: CRNA, Surgeon and Anesthesiologist  Anesthesia Plan Comments:        Anesthesia Quick Evaluation

## 2020-04-22 NOTE — Anesthesia Procedure Notes (Signed)
Procedure Name: LMA Insertion Date/Time: 04/22/2020 9:55 AM Performed by: Myna Bright, CRNA Pre-anesthesia Checklist: Patient identified, Emergency Drugs available, Suction available and Patient being monitored Patient Re-evaluated:Patient Re-evaluated prior to induction Oxygen Delivery Method: Circle system utilized Preoxygenation: Pre-oxygenation with 100% oxygen Induction Type: IV induction Ventilation: Mask ventilation without difficulty LMA: LMA with gastric port inserted LMA Size: 5.0 Number of attempts: 1 Placement Confirmation: positive ETCO2 and breath sounds checked- equal and bilateral Tube secured with: Tape Dental Injury: Teeth and Oropharynx as per pre-operative assessment

## 2020-04-23 ENCOUNTER — Encounter (HOSPITAL_BASED_OUTPATIENT_CLINIC_OR_DEPARTMENT_OTHER): Payer: Self-pay | Admitting: Urology

## 2020-04-23 LAB — SURGICAL PATHOLOGY

## 2020-04-23 NOTE — Anesthesia Postprocedure Evaluation (Signed)
Anesthesia Post Note  Patient: [place]  Procedure(s) Performed: CYSTOSCOPY WITH RETROGRADE PYELOGRAM (Bilateral Pelvis) TRANSURETHRAL RESECTION OF BLADDER TUMOR (TURBT); FULGERATION (N/A Bladder)     Patient location during evaluation: PACU Anesthesia Type: General Level of consciousness: awake and alert Pain management: pain level controlled Vital Signs Assessment: post-procedure vital signs reviewed and stable Respiratory status: spontaneous breathing, nonlabored ventilation, respiratory function stable and patient connected to nasal cannula oxygen Cardiovascular status: blood pressure returned to baseline and stable Postop Assessment: no apparent nausea or vomiting Anesthetic complications: no   No complications documented.  Last Vitals:  Vitals:   04/22/20 1105 04/22/20 1145  BP: (!) 171/76 (!) 166/99  Pulse: 64 64  Resp: 16 16  Temp:  (!) 36.3 C  SpO2: 92% 97%    Last Pain:  Vitals:   04/22/20 1145  TempSrc:   PainSc: 0-No pain   Pain Goal: Patients Stated Pain Goal: 4 (04/22/20 1100)                 Anett Ranker

## 2020-04-30 DIAGNOSIS — C678 Malignant neoplasm of overlapping sites of bladder: Secondary | ICD-10-CM | POA: Diagnosis not present

## 2020-05-20 ENCOUNTER — Other Ambulatory Visit: Payer: Self-pay

## 2020-05-21 ENCOUNTER — Encounter: Payer: Self-pay | Admitting: Family Medicine

## 2020-05-21 ENCOUNTER — Ambulatory Visit (INDEPENDENT_AMBULATORY_CARE_PROVIDER_SITE_OTHER): Payer: HMO | Admitting: Family Medicine

## 2020-05-21 VITALS — BP 130/78 | HR 53 | Temp 98.3°F | Resp 16 | Ht 67.0 in | Wt 249.6 lb

## 2020-05-21 DIAGNOSIS — E7800 Pure hypercholesterolemia, unspecified: Secondary | ICD-10-CM

## 2020-05-21 DIAGNOSIS — I10 Essential (primary) hypertension: Secondary | ICD-10-CM | POA: Diagnosis not present

## 2020-05-21 DIAGNOSIS — E119 Type 2 diabetes mellitus without complications: Secondary | ICD-10-CM | POA: Diagnosis not present

## 2020-05-21 LAB — BASIC METABOLIC PANEL
BUN: 16 mg/dL (ref 6–23)
CO2: 32 mEq/L (ref 19–32)
Calcium: 9.6 mg/dL (ref 8.4–10.5)
Chloride: 102 mEq/L (ref 96–112)
Creatinine, Ser: 1.19 mg/dL (ref 0.40–1.50)
GFR: 60.68 mL/min (ref >60.00)
Glucose, Bld: 114 mg/dL — ABNORMAL HIGH (ref 70–99)
Potassium: 4.4 mEq/L (ref 3.5–5.1)
Sodium: 141 mEq/L (ref 135–145)

## 2020-05-21 LAB — LIPID PANEL
Cholesterol: 197 mg/dL (ref 0–200)
HDL: 54.1 mg/dL (ref >39.00)
LDL Cholesterol: 110 mg/dL — ABNORMAL HIGH (ref 0–99)
NonHDL: 142.72
Total CHOL/HDL Ratio: 4
Triglycerides: 164 mg/dL — ABNORMAL HIGH (ref 0.0–149.0)
VLDL: 32.8 mg/dL (ref 0.0–40.0)

## 2020-05-21 LAB — HEMOGLOBIN A1C: Hgb A1c MFr Bld: 6.7 % — ABNORMAL HIGH (ref 4.6–6.5)

## 2020-05-21 NOTE — Progress Notes (Signed)
OFFICE VISIT  05/21/2020  CC:  Chief Complaint  Patient presents with  . Follow-up    RCI, pt is fasting    HPI:    Patient is a 75 y.o. Caucasian male who presents for 8mo f/u DM, HTN, and HLD. A/P as of last visit: "1) Gross hematuria, bladder mass detected on w/u, plan for TURBT with . DM, HTN, HLD, CAD all medically stable. Clear for surgery from medical standpoint. Cardiologist has cleared him as well."  INTERIM HX: Feeling fine. No home glucose or bp monitoring. He eats what he wants for the most part.  He got his bladder mass resected 04/22/20 (transurethral).  He denies any probs from this.  ROS as above, plus--> no fevers, no CP, no SOB, no wheezing, no cough, no dizziness, no HAs, no rashes, no melena/hematochezia.  No polyuria or polydipsia.  No myalgias or arthralgias.  No focal weakness, paresthesias, or tremors.  No acute vision or hearing abnormalities.  No dysuria or unusual/new urinary urgency or frequency.  No recent changes in lower legs. No n/v/d or abd pain.  No palpitations.     Past Medical History:  Diagnosis Date  . Bladder mass 03/2020   urol to do TURBT  . Cancer ([place])    bladder  . Coronary artery disease    a. DES to LAD 04/2011 - took Effient/ASA x 1 yr, now on ASA only. b. Cath 10/2012: patent stent, otherwise normal cors. c. 05/2018 Prox LAD stent 75% restenosed, DES placed.  08/30/19 cath: patent stent, mild distal LAD dz not requiring intervention, LVEF nl, EDP normal: maximize antianginal meds.  Marland Kitchen COVID 04/04/2020   asymptomatic  . Diabetes mellitus with complication ([place]) 43/1540   A1c 6.9%  . DM type 2 (diabetes mellitus, type 2) ([place])   . GERD (gastroesophageal reflux disease)    HAS HAD ESOPHAGUS STRETCHED SEVERAL TIMES IN THE PAST  . Gross hematuria 2021   Bladder tumor noted on CT and cystoscopy.  Urol to do TURBT 03/2020.  Marland Kitchen Hyperlipidemia    goal LDL < 70  . Hypertension   . Myocardial infarction ([place]) 04/21/2016  . OSA on CPAP  10/18/2011   doesn't wear it  . Poor historian    talk to dtr pam hooker cell [phone]  . Right knee pain    TORN RIGHT KNEE MEDIAL MENSICAL TEAR  . Wears dentures   . Wears glasses     Past Surgical History:  Procedure Laterality Date  . CARDIAC CATHETERIZATION  06/2017   In-stent restenosis cleared out  . CARDIOVASCULAR STRESS TEST     Latter part of 2019--normal per cardiologist's note from 10/2017  . CERVICAL FUSION  1990 and 1993   X 2   SURGERIES    SLIGHT LIMITATION ROM  . COLONOSCOPY  11/24/2004; 2018   NORMAL.  Recall 2016.  Repeat 2018 normal per pt.  . CORONARY ANGIOPLASTY WITH STENT PLACEMENT    . CORONARY STENT INTERVENTION N/A 05/22/2018   Prox LAD for in stent restenosis.  DAPT x 1 yr.  Procedure: CORONARY STENT INTERVENTION;  Surgeon: Jettie Booze, MD;  Location: [facility];  Service: Cardiovascular;  Laterality: N/A;  . CYSTOSCOPY W/ RETROGRADES Bilateral 04/22/2020   Procedure: CYSTOSCOPY WITH RETROGRADE PYELOGRAM;  Surgeon: Remi Haggard, MD;  Location: [facility];  Service: Urology;  Laterality: Bilateral;  30 MINS  . INTRAVASCULAR ULTRASOUND/IVUS N/A 05/22/2018   Procedure: Intravascular Ultrasound/IVUS;  Surgeon: Jettie Booze,  MD;  Location: [facility];  Service: Cardiovascular;  Laterality: N/A;  . KNEE ARTHROSCOPY  10/22/2011   Procedure: ARTHROSCOPY KNEE;  Surgeon: Gearlean Alf, MD;  Location: [facility];  Service: Orthopedics;  Laterality: Right;  Right knee scope with debridement  . LAPAROSCOPIC CHOLECYSTECTOMY  2018  . LEFT HEART CATH AND CORONARY ANGIOGRAPHY N/A 05/22/2018   DES placed for in stent restenosis.  EF 55-60%.  Procedure: LEFT HEART CATH AND CORONARY ANGIOGRAPHY;  Surgeon: Jettie Booze, MD;  Location: [facility];  Service: Cardiovascular;  Laterality: N/A;  . LEFT HEART CATH AND CORONARY ANGIOGRAPHY N/A 08/30/2019   No change from 2020 cath->patent LAD stent, mild distal LAD dz.   No intervention required. Nitrates + possible future CCB antianginal for poss microvasc dz. Procedure: LEFT HEART CATH AND CORONARY ANGIOGRAPHY;  Surgeon: Jettie Booze, MD;  Location: [facility];  Service: Cardiovascular;  Laterality: N/A;  . LEFT HEART CATHETERIZATION WITH CORONARY ANGIOGRAM N/A 10/08/2012   Procedure: LEFT HEART CATHETERIZATION WITH CORONARY ANGIOGRAM;  Surgeon: Lorretta Harp, MD;  Location: [facility];  Service: Cardiovascular;  Laterality: N/A;  . NASAL SINUS SURGERY  yrs ago   ONE SINUS SURGERY THRU NOSE AND ANOTHER SINUS SURGERY THRU INCISION ABOVE RT EYE  . TRANSTHORACIC ECHOCARDIOGRAM  05/21/2018   EF 60-65%, grd I DD, no valvular probs.  . TRANSURETHRAL RESECTION OF BLADDER TUMOR N/A 04/22/2020   PATH: NONinvasive high grade uroepithelial malignancy. Procedure: TRANSURETHRAL RESECTION OF BLADDER TUMOR (TURBT); FULGERATION;  Surgeon: Remi Haggard, MD;  Location: [facility];  Service: Urology;  Laterality: N/A;    Outpatient Medications Prior to Visit  Medication Sig Dispense Refill  . acetaminophen (TYLENOL) 500 MG tablet Take 1,500 mg by mouth every 6 (six) hours as needed for mild pain or moderate pain (Sinus).    Marland Kitchen Apoaequorin (PREVAGEN) 10 MG CAPS Take 10 mg by mouth daily.    Marland Kitchen aspirin EC 81 MG EC tablet Take 1 tablet (81 mg total) by mouth daily.    Marland Kitchen atorvastatin (LIPITOR) 80 MG tablet TAKE 1 TABLET (80 MG TOTAL) BY MOUTH DAILY AT 6 PM. 90 tablet 1  . cholecalciferol (VITAMIN D) 1000 UNITS tablet Take 2,000 Units by mouth every evening.    . clopidogrel (PLAVIX) 75 MG tablet Take 1 tablet (75 mg total) by mouth daily with breakfast. 90 tablet 3  . escitalopram (LEXAPRO) 10 MG tablet Take 1 tablet (10 mg total) by mouth daily. 90 tablet 3  . fenofibrate (TRICOR) 145 MG tablet Take 1 tablet (145 mg total) by mouth daily. (Patient taking differently: Take 145 mg by mouth at bedtime.) 90 tablet 3  . furosemide (LASIX) 40 MG tablet  Take 40 mg by mouth daily. Take every  day except: Sunday    . gabapentin (NEURONTIN) 300 MG capsule Take 1 capsule (300 mg total) by mouth 2 (two) times daily. 180 capsule 3  . isosorbide mononitrate (IMDUR) 30 MG 24 hr tablet Take 0.5 tablets (15 mg total) by mouth daily. 45 tablet 3  . lisinopril (ZESTRIL) 40 MG tablet TAKE 1 TABLET (40 MG TOTAL) BY MOUTH DAILY. PLEASE KEEP UPCOMING APPT IN JULY BEFORE ANYMORE REFILLS (Patient taking differently: at bedtime.) 90 tablet 1  . metFORMIN (GLUCOPHAGE) 500 MG tablet Take 1 tablet (500 mg total) by mouth 2 (two) times daily with a meal.    . metoprolol succinate (TOPROL-XL) 100 MG 24 hr tablet TAKE 1 TABLET (100  MG TOTAL) BY MOUTH DAILY. TAKE WITH OR IMMEDIATELY FOLLOWING A MEAL. (Patient taking differently: at bedtime.) 90 tablet 0  . pantoprazole (PROTONIX) 40 MG tablet Take 40 mg by mouth every evening.    . traMADol (ULTRAM) 50 MG tablet Take 1 tablet (50 mg total) by mouth every 6 (six) hours as needed. 15 tablet 0  . vitamin B-12 (CYANOCOBALAMIN) 1000 MCG tablet Take 1,000 mcg by mouth every other day.    . dicyclomine (BENTYL) 10 MG capsule TAKE 1 CAPSULE (10 MG TOTAL) BY MOUTH 4 (FOUR) TIMES DAILY - BEFORE MEALS AND AT BEDTIME. (Patient not taking: Reported on 05/21/2020) 360 capsule 1  . nitroGLYCERIN (NITROSTAT) 0.4 MG SL tablet PLACE 1 TABLET UNDER THE TONGUE EVERY 5 MINUTES X 3 DOSES AS NEEDED FOR CHEST PAIN. (Patient not taking: No sig reported) 25 tablet 6  . phenylephrine (SINUS RELIEF EXTRA STRENGTH) 1 % nasal spray Place 1 drop into both nostrils every 6 (six) hours as needed for congestion. (Patient not taking: Reported on 05/21/2020)     No facility-administered medications prior to visit.    No Known Allergies  ROS As per HPI  PE: Vitals with BMI 05/21/2020 04/22/2020 04/22/2020  Height 5\' 7"  - -  Weight 249 lbs 10 oz - -  BMI 85.02 - -  Systolic 774 128 786  Diastolic 78 99 76  Pulse 53 64 64   Gen: Alert, well appearing.   Patient is oriented to person, place, time, and situation. AFFECT: pleasant, lucid thought and speech. CV: RRR, no m/r/g.   LUNGS: CTA bilat, nonlabored resps, good aeration in all lung fields. EXT: no clubbing or cyanosis.  no edema.    LABS:  Lab Results  Component Value Date   TSH 1.54 08/24/2019   Lab Results  Component Value Date   WBC 6.8 02/20/2020   HGB 14.3 04/22/2020   HCT 42.0 04/22/2020   MCV 95.4 02/20/2020   PLT 184.0 02/20/2020   Lab Results  Component Value Date   CREATININE 1.10 04/22/2020   BUN 14 04/22/2020   NA 143 04/22/2020   K 4.1 04/22/2020   CL 105 04/22/2020   CO2 31 02/20/2020   Lab Results  Component Value Date   ALT 10 02/20/2020   AST 15 02/20/2020   ALKPHOS 53 02/20/2020   BILITOT 0.7 02/20/2020   Lab Results  Component Value Date   CHOL 234 (H) 02/20/2020   Lab Results  Component Value Date   HDL 47.20 02/20/2020   Lab Results  Component Value Date   LDLCALC 27 08/18/2018   Lab Results  Component Value Date   TRIG 275.0 (H) 02/20/2020   Lab Results  Component Value Date   CHOLHDL 5 02/20/2020   Lab Results  Component Value Date   PSA 1.00 02/20/2020   Lab Results  Component Value Date   HGBA1C 6.9 (H) 02/20/2020   IMPRESSION AND PLAN:  1) DM 2, control good historically. Cont metformin 500 mg bid. A1c and bmet today.  2) HTN: good control.  Cont lisin 40 qd and toprol xl 100mg  qd. Lytes/cr today.  3) HLD: goal LDL <70. Tolerating atorva 80 qd and fenofibrate 145mg  qd. FLP today.  4) CAD, asymptomatic. Cont ASA, plavix, BB, statin, imdur, approp cardiol f/u.  5) Bladder CA: resected in entirety 1 mo ago via transurethral method. Urol thinks they got it all but going back in with cysto soon to recheck.  An After Visit Summary was printed and  given to the patient.  FOLLOW UP: Return in about 3 months (around 08/21/2020) for routine chronic illness f/u. Next cpe 02/2021  Signed:  Crissie Sickles, MD            05/21/2020

## 2020-07-16 ENCOUNTER — Other Ambulatory Visit: Payer: Self-pay | Admitting: Family Medicine

## 2020-07-30 DIAGNOSIS — N400 Enlarged prostate without lower urinary tract symptoms: Secondary | ICD-10-CM | POA: Diagnosis not present

## 2020-07-30 DIAGNOSIS — C678 Malignant neoplasm of overlapping sites of bladder: Secondary | ICD-10-CM | POA: Diagnosis not present

## 2020-08-19 ENCOUNTER — Encounter: Payer: Self-pay | Admitting: Family Medicine

## 2020-08-19 NOTE — Progress Notes (Signed)
OFFICE VISIT  08/20/2020  CC:  Chief Complaint  Patient presents with   Follow-up    RCI, 3 mo. Pt is fasting    HPI:    Patient is a 75 y.o. Caucasian male who presents for 3 mo f/u DM, HTN, and HLD.  He has CAD with hx of MI and stent placement, preserved EF but has DD.  A/P as of last visit: "1) DM 2, control good historically. Cont metformin 500 mg bid. A1c and bmet today.   2) HTN: good control.  Cont lisin 40 qd and toprol xl 100mg  qd. Lytes/cr today.   3) HLD: goal LDL <70. Tolerating atorva 80 qd and fenofibrate 145mg  qd. FLP today.   4) CAD, asymptomatic. Cont ASA, plavix, BB, statin, imdur, approp cardiol f/u.   5) Bladder CA: resected in entirety 1 mo ago via transurethral method. Urol thinks they got it all but going back in with cysto soon to recheck."  INTERIM HX: Feeling well. Eating well---he eats whatever he wants. He piddles around his garden.  DM: no home glucose monitoring. Compliant with metformin   HTN: no home bp monitoring.    HLD:  taking atorva 80 qd and fenofibrate qd.  CP: has been months since he felt any CP until the last week he has had 3 brief episodes of substernal pain w/out diaphoresis, dizziness, SOB, nausea, arm pain, or jaw pain.  One time occurred when walking, other times sitting/resting.  Resolved in a few minutes w/out nitro.  ROS as above, plus--> no fevers, no SOB, no wheezing, no cough, no dizziness, no HAs, no rashes, no melena/hematochezia.  No polyuria or polydipsia.  No myalgias or arthralgias.  No focal weakness, paresthesias, or tremors.  No acute vision or hearing abnormalities.  No dysuria or unusual/new urinary urgency or frequency.  No recent changes in lower legs. No n/v/d or abd pain.  No palpitations.     Past Medical History:  Diagnosis Date   Coronary artery disease    a. DES to LAD 04/2011 - took Effient/ASA x 1 yr, now on ASA only. b. Cath 10/2012: patent stent, otherwise normal cors. c. 05/2018 Prox LAD  stent 75% restenosed, DES placed.  08/30/19 cath: patent stent, mild distal LAD dz not requiring intervention, LVEF nl, EDP normal: maximize antianginal meds.   COVID 04/04/2020   asymptomatic   Diabetes mellitus with complication ([place]) 38/2505   A1c 6.9%   GERD (gastroesophageal reflux disease)    HAS HAD ESOPHAGUS STRETCHED SEVERAL TIMES IN THE PAST   History of bladder cancer 04/2020   TURBT 04/2020   Hyperlipidemia    goal LDL < 70   Hypertension    Myocardial infarction ([place]) 04/21/2016   OSA on CPAP 10/18/2011   doesn't wear it   Poor historian    talk to dtr pam hooker cell [phone]   Right knee pain    RIGHT KNEE MEDIAL MENSICAL TEAR    Past Surgical History:  Procedure Laterality Date   CARDIAC CATHETERIZATION  06/2017   In-stent restenosis cleared out   CARDIOVASCULAR STRESS TEST     Latter part of 2019--normal per cardiologist's note from 10/2017   [place] and 1993   X 2   SURGERIES    SLIGHT LIMITATION ROM   COLONOSCOPY  11/24/2004; 2018   NORMAL.  Recall 2016.  Repeat 2018 normal per pt.   CORONARY ANGIOPLASTY WITH STENT PLACEMENT     CORONARY STENT INTERVENTION N/A 05/22/2018  Prox LAD for in stent restenosis.  DAPT x 1 yr.  Procedure: CORONARY STENT INTERVENTION;  Surgeon: Jettie Booze, MD;  Location: [place] [facility];  Service: Cardiovascular;  Laterality: N/A;   CYSTOSCOPY W/ RETROGRADES Bilateral 04/22/2020   Procedure: CYSTOSCOPY WITH RETROGRADE PYELOGRAM;  Surgeon: Remi Haggard, MD;  Location: [facility];  Service: Urology;  Laterality: Bilateral;  30 MINS   INTRAVASCULAR ULTRASOUND/IVUS N/A 05/22/2018   Procedure: Intravascular Ultrasound/IVUS;  Surgeon: Jettie Booze, MD;  Location: [facility];  Service: Cardiovascular;  Laterality: N/A;   KNEE ARTHROSCOPY  10/22/2011   Procedure: ARTHROSCOPY KNEE;  Surgeon: Gearlean Alf, MD;  Location: [facility];  Service: Orthopedics;  Laterality: Right;  Right  knee scope with debridement   LAPAROSCOPIC CHOLECYSTECTOMY  2018   LEFT HEART CATH AND CORONARY ANGIOGRAPHY N/A 05/22/2018   DES placed for in stent restenosis.  EF 55-60%.  Procedure: LEFT HEART CATH AND CORONARY ANGIOGRAPHY;  Surgeon: Jettie Booze, MD;  Location: [facility];  Service: Cardiovascular;  Laterality: N/A;   LEFT HEART CATH AND CORONARY ANGIOGRAPHY N/A 08/30/2019   No change from 2020 cath->patent LAD stent, mild distal LAD dz.  No intervention required. Nitrates + possible future CCB antianginal for poss microvasc dz. Procedure: LEFT HEART CATH AND CORONARY ANGIOGRAPHY;  Surgeon: Jettie Booze, MD;  Location: [facility];  Service: Cardiovascular;  Laterality: N/A;   LEFT HEART CATHETERIZATION WITH CORONARY ANGIOGRAM N/A 10/08/2012   Procedure: LEFT HEART CATHETERIZATION WITH CORONARY ANGIOGRAM;  Surgeon: Lorretta Harp, MD;  Location: [facility];  Service: Cardiovascular;  Laterality: N/A;   NASAL SINUS SURGERY  yrs ago   ONE SINUS SURGERY THRU NOSE AND ANOTHER SINUS SURGERY THRU INCISION ABOVE RT EYE   TRANSTHORACIC ECHOCARDIOGRAM  05/21/2018   EF 60-65%, grd I DD, no valvular probs.   TRANSURETHRAL RESECTION OF BLADDER TUMOR N/A 04/22/2020   PATH: NONinvasive high grade uroepithelial malignancy. Procedure: TRANSURETHRAL RESECTION OF BLADDER TUMOR (TURBT); FULGERATION;  Surgeon: Remi Haggard, MD;  Location: [facility];  Service: Urology;  Laterality: N/A;    Outpatient Medications Prior to Visit  Medication Sig Dispense Refill   acetaminophen (TYLENOL) 500 MG tablet Take 1,500 mg by mouth every 6 (six) hours as needed for mild pain or moderate pain (Sinus).     Apoaequorin (PREVAGEN) 10 MG CAPS Take 10 mg by mouth daily.     aspirin EC 81 MG EC tablet Take 1 tablet (81 mg total) by mouth daily.     atorvastatin (LIPITOR) 80 MG tablet TAKE 1 TABLET (80 MG TOTAL) BY MOUTH DAILY AT 6 PM. 90 tablet 1   cholecalciferol (VITAMIN D)  1000 UNITS tablet Take 2,000 Units by mouth every evening.     clopidogrel (PLAVIX) 75 MG tablet Take 1 tablet (75 mg total) by mouth daily with breakfast. 90 tablet 3   escitalopram (LEXAPRO) 10 MG tablet Take 1 tablet (10 mg total) by mouth daily. 90 tablet 3   fenofibrate (TRICOR) 145 MG tablet Take 1 tablet (145 mg total) by mouth daily. (Patient taking differently: Take 145 mg by mouth at bedtime.) 90 tablet 3   furosemide (LASIX) 40 MG tablet Take 40 mg by mouth daily. Take every  day except: Sunday     gabapentin (NEURONTIN) 300 MG capsule Take 1 capsule (300 mg total) by mouth 2 (two) times daily. 180 capsule 3   isosorbide mononitrate (IMDUR) 30 MG 24  hr tablet Take 0.5 tablets (15 mg total) by mouth daily. 45 tablet 3   lisinopril (ZESTRIL) 40 MG tablet TAKE 1 TABLET (40 MG TOTAL) BY MOUTH DAILY. PLEASE KEEP UPCOMING APPT IN JULY BEFORE ANYMORE REFILLS (Patient taking differently: at bedtime.) 90 tablet 1   metFORMIN (GLUCOPHAGE) 500 MG tablet Take 1 tablet (500 mg total) by mouth 2 (two) times daily with a meal.     metoprolol succinate (TOPROL-XL) 100 MG 24 hr tablet TAKE 1 TABLET (100 MG TOTAL) BY MOUTH DAILY. TAKE WITH OR IMMEDIATELY FOLLOWING A MEAL. (Patient taking differently: at bedtime.) 90 tablet 0   pantoprazole (PROTONIX) 40 MG tablet Take 40 mg by mouth every evening.     vitamin B-12 (CYANOCOBALAMIN) 1000 MCG tablet Take 1,000 mcg by mouth every other day.     dicyclomine (BENTYL) 10 MG capsule TAKE 1 CAPSULE (10 MG TOTAL) BY MOUTH 4 (FOUR) TIMES DAILY - BEFORE MEALS AND AT BEDTIME. (Patient not taking: No sig reported) 360 capsule 1   nitroGLYCERIN (NITROSTAT) 0.4 MG SL tablet PLACE 1 TABLET UNDER THE TONGUE EVERY 5 MINUTES X 3 DOSES AS NEEDED FOR CHEST PAIN. (Patient not taking: No sig reported) 25 tablet 6   phenylephrine (SINUS RELIEF EXTRA STRENGTH) 1 % nasal spray Place 1 drop into both nostrils every 6 (six) hours as needed for congestion. (Patient not taking: No sig  reported)     traMADol (ULTRAM) 50 MG tablet Take 1 tablet (50 mg total) by mouth every 6 (six) hours as needed. (Patient not taking: Reported on 08/20/2020) 15 tablet 0   No facility-administered medications prior to visit.    No Known Allergies  ROS As per HPI  PE: Vitals with BMI 08/20/2020 05/21/2020 04/22/2020  Height 5\' 7"  5\' 7"  -  Weight 253 lbs 6 oz 249 lbs 10 oz -  BMI 09.38 18.29 -  Systolic 937 169 678  Diastolic 74 78 99  Pulse 56 53 64     Gen: Alert, well appearing.  Patient is oriented to person, place, time, and situation. AFFECT: pleasant, lucid thought and speech. CV: RRR, no m/r/g.   LUNGS: CTA bilat, nonlabored resps, good aeration in all lung fields. EXT: no clubbing or cyanosis.  no edema.    LABS:  Lab Results  Component Value Date   TSH 1.54 08/24/2019   Lab Results  Component Value Date   WBC 6.8 02/20/2020   HGB 14.3 04/22/2020   HCT 42.0 04/22/2020   MCV 95.4 02/20/2020   PLT 184.0 02/20/2020  No results found for: VITAMINB12  Lab Results  Component Value Date   CREATININE 1.19 05/21/2020   BUN 16 05/21/2020   NA 141 05/21/2020   K 4.4 05/21/2020   CL 102 05/21/2020   CO2 32 05/21/2020   Lab Results  Component Value Date   ALT 10 02/20/2020   AST 15 02/20/2020   ALKPHOS 53 02/20/2020   BILITOT 0.7 02/20/2020   Lab Results  Component Value Date   CHOL 197 05/21/2020   Lab Results  Component Value Date   HDL 54.10 05/21/2020   Lab Results  Component Value Date   LDLCALC 110 (H) 05/21/2020   Lab Results  Component Value Date   TRIG 164.0 (H) 05/21/2020   Lab Results  Component Value Date   CHOLHDL 4 05/21/2020   Lab Results  Component Value Date   PSA 1.00 02/20/2020   Lab Results  Component Value Date   HGBA1C 6.7 (H) 05/21/2020  IMPRESSION AND PLAN:  1) DM, with CAD. Hba1c, lytes/cr, and urine microalb/cr today.  2) HTN: stable on lisinopril 40 qd, toprol xl 100 qd, imdur 30 qd. Lytes/cr today.  3)  HLD: goal LDL <70.  He is on maximum statin therapy + fibrate.   Last LDL 3 mo ago was improved->110. FLP and hepatic panel today.  If LDL still >70 then will recommend zetia added to current therapy.  4) CAD; hx of MI and stent, preserved EF but has DD. Some recent mild/brief CP that I do not think is [place].  Small vessel dz likely. No new cardiac testing at this time. Signs/symptoms to call or return for were reviewed and pt expressed understanding. Cont ASA, plavix, BB, statin, imdur.  Has sl NTG for prn use.  5) Bladder cancer: he is s/p TURBT about 4 mo ago. Will be following up soon with urol for relook cystoscopy.  An After Visit Summary was printed and given to the patient.  FOLLOW UP: No follow-ups on file.  Signed:  Crissie Sickles, MD           08/20/2020

## 2020-08-20 ENCOUNTER — Ambulatory Visit (INDEPENDENT_AMBULATORY_CARE_PROVIDER_SITE_OTHER): Payer: HMO | Admitting: Family Medicine

## 2020-08-20 ENCOUNTER — Other Ambulatory Visit: Payer: Self-pay

## 2020-08-20 ENCOUNTER — Encounter: Payer: Self-pay | Admitting: Family Medicine

## 2020-08-20 VITALS — BP 119/74 | HR 56 | Temp 97.9°F | Resp 16 | Ht 67.0 in | Wt 253.4 lb

## 2020-08-20 DIAGNOSIS — I25119 Atherosclerotic heart disease of native coronary artery with unspecified angina pectoris: Secondary | ICD-10-CM | POA: Diagnosis not present

## 2020-08-20 DIAGNOSIS — E7800 Pure hypercholesterolemia, unspecified: Secondary | ICD-10-CM | POA: Diagnosis not present

## 2020-08-20 DIAGNOSIS — E118 Type 2 diabetes mellitus with unspecified complications: Secondary | ICD-10-CM

## 2020-08-20 DIAGNOSIS — I10 Essential (primary) hypertension: Secondary | ICD-10-CM | POA: Diagnosis not present

## 2020-08-20 DIAGNOSIS — E1159 Type 2 diabetes mellitus with other circulatory complications: Secondary | ICD-10-CM | POA: Diagnosis not present

## 2020-08-20 LAB — LIPID PANEL
Cholesterol: 195 mg/dL (ref 0–200)
HDL: 49.7 mg/dL (ref >39.00)
LDL Cholesterol: 115 mg/dL — ABNORMAL HIGH (ref 0–99)
NonHDL: 145.75
Total CHOL/HDL Ratio: 4
Triglycerides: 153 mg/dL — ABNORMAL HIGH (ref 0.0–149.0)
VLDL: 30.6 mg/dL (ref 0.0–40.0)

## 2020-08-20 LAB — MICROALBUMIN / CREATININE URINE RATIO
Creatinine,U: 248.5 mg/dL
Microalb Creat Ratio: 0.5 mg/g (ref 0.0–30.0)
Microalb, Ur: 1.3 mg/dL (ref 0.0–1.9)

## 2020-08-20 LAB — COMPREHENSIVE METABOLIC PANEL
ALT: 10 U/L (ref 0–53)
AST: 12 U/L (ref 0–37)
Albumin: 4.3 g/dL (ref 3.5–5.2)
Alkaline Phosphatase: 33 U/L — ABNORMAL LOW (ref 39–117)
BUN: 19 mg/dL (ref 6–23)
CO2: 28 mEq/L (ref 19–32)
Calcium: 9.1 mg/dL (ref 8.4–10.5)
Chloride: 106 mEq/L (ref 96–112)
Creatinine, Ser: 1.15 mg/dL (ref 0.40–1.50)
GFR: 63.11 mL/min (ref >60.00)
Glucose, Bld: 123 mg/dL — ABNORMAL HIGH (ref 70–99)
Potassium: 4.3 mEq/L (ref 3.5–5.1)
Sodium: 143 mEq/L (ref 135–145)
Total Bilirubin: 0.5 mg/dL (ref 0.2–1.2)
Total Protein: 6.3 g/dL (ref 6.0–8.3)

## 2020-08-20 LAB — HEMOGLOBIN A1C: Hgb A1c MFr Bld: 6.9 % — ABNORMAL HIGH (ref 4.6–6.5)

## 2020-08-21 ENCOUNTER — Other Ambulatory Visit: Payer: Self-pay

## 2020-08-21 MED ORDER — EZETIMIBE 10 MG PO TABS: 10.0000 mg | ORAL_TABLET | Freq: Every day | ORAL | 2 refills | Status: DC

## 2020-09-10 ENCOUNTER — Ambulatory Visit: Payer: HMO

## 2020-09-17 ENCOUNTER — Encounter: Payer: Self-pay | Admitting: Family Medicine

## 2020-10-06 ENCOUNTER — Other Ambulatory Visit: Payer: Self-pay | Admitting: Cardiovascular Disease

## 2020-10-24 ENCOUNTER — Other Ambulatory Visit: Payer: Self-pay | Admitting: Family Medicine

## 2020-10-29 DIAGNOSIS — C678 Malignant neoplasm of overlapping sites of bladder: Secondary | ICD-10-CM | POA: Diagnosis not present

## 2020-11-07 ENCOUNTER — Encounter: Payer: Self-pay | Admitting: Family Medicine

## 2020-11-10 ENCOUNTER — Other Ambulatory Visit: Payer: Self-pay | Admitting: Family Medicine

## 2020-11-11 NOTE — Telephone Encounter (Signed)
Has appt 09/14

## 2020-11-18 ENCOUNTER — Other Ambulatory Visit: Payer: Self-pay | Admitting: Family Medicine

## 2020-11-19 ENCOUNTER — Ambulatory Visit: Payer: HMO | Admitting: Family Medicine

## 2020-12-02 DIAGNOSIS — Z9189 Other specified personal risk factors, not elsewhere classified: Secondary | ICD-10-CM

## 2020-12-02 NOTE — Progress Notes (Signed)
[facility])                                            [place] Team                                        Statin Quality Measure Assessment    12/02/2020  Richard [name] [birth year] [phone]  Per review of chart and payor information, this patient has been flagged for non-adherence to the following CMS Quality Measure:   []  Statin Use in Persons with Diabetes  [x]  Statin Use in Persons with Cardiovascular Disease  The 10-year ASCVD risk score (Arnett DK, et al., 2019) is: 38.6%   Values used to calculate the score:     Age: 27 years     Sex: Male     Is Non-Hispanic African American: No     Diabetic: Yes     Tobacco smoker: No     Systolic Blood Pressure: 623 mmHg     Is BP treated: Yes     HDL Cholesterol: 49.7 mg/dL     Total Cholesterol: 195 mg/dL LDL 115 mg/dL from 08/20/2020  Currently prescribed statin:  []  Yes [x]  No     Comments: N/A - Zetia on file. H/o of noncompliance per OV on 02/20/2020. Recent O/v note indicates pt should still be on Atorvastatin 80 mg, but it was last filled December 2020. Unsure if patient is filling at another pharmacy, noncompliant, or has statin intolerance. If deemed therapeutically appropriate, please consider restarting statin or associating exclusion code of intolerance (see below) at the next office.  History of statin use:            []  Yes [x]  No   Comments: Patient has a h/o pravastatinm simvastatin, atorvastatin.     Please consider ONE of the following recommendations:   Initiate high intensity statin Atorvastatin 40mg  once daily, #90, 3 refills   Rosuvastatin 20mg  once daily, #90, 3 refills    Initiate moderate intensity          statin with reduced frequency if prior          statin intolerance 1x weekly, #13, 3 refills   2x weekly, #26, 3 refills   3x weekly, #39, 3 refills    Code for past statin intolerance or other exclusions (required annually)   Drug Induced Myopathy G72.0   Myositis, unspecified M60.9   Rhabdomyolysis M62.82   Prediabetes R73.03   Adverse effect of antihyperlipidemic and antiarteriosclerotic drugs, initial encounter J62.8B1D    Thank you for your time,  Kristeen Miss, [place] Cell: [phone]

## 2020-12-03 ENCOUNTER — Other Ambulatory Visit: Payer: Self-pay

## 2020-12-03 ENCOUNTER — Encounter: Payer: Self-pay | Admitting: Family Medicine

## 2020-12-03 ENCOUNTER — Ambulatory Visit (INDEPENDENT_AMBULATORY_CARE_PROVIDER_SITE_OTHER): Payer: HMO | Admitting: Family Medicine

## 2020-12-03 VITALS — BP 167/82 | HR 59 | Temp 98.0°F | Resp 16 | Ht 67.0 in | Wt 254.6 lb

## 2020-12-03 DIAGNOSIS — E7800 Pure hypercholesterolemia, unspecified: Secondary | ICD-10-CM

## 2020-12-03 DIAGNOSIS — I2510 Atherosclerotic heart disease of native coronary artery without angina pectoris: Secondary | ICD-10-CM | POA: Diagnosis not present

## 2020-12-03 DIAGNOSIS — E1159 Type 2 diabetes mellitus with other circulatory complications: Secondary | ICD-10-CM

## 2020-12-03 DIAGNOSIS — I5032 Chronic diastolic (congestive) heart failure: Secondary | ICD-10-CM | POA: Diagnosis not present

## 2020-12-03 DIAGNOSIS — Z9861 Coronary angioplasty status: Secondary | ICD-10-CM

## 2020-12-03 DIAGNOSIS — I10 Essential (primary) hypertension: Secondary | ICD-10-CM

## 2020-12-03 DIAGNOSIS — I25118 Atherosclerotic heart disease of native coronary artery with other forms of angina pectoris: Secondary | ICD-10-CM | POA: Diagnosis not present

## 2020-12-03 LAB — COMPREHENSIVE METABOLIC PANEL
ALT: 11 U/L (ref 0–53)
AST: 14 U/L (ref 0–37)
Albumin: 4.2 g/dL (ref 3.5–5.2)
Alkaline Phosphatase: 40 U/L (ref 39–117)
BUN: 18 mg/dL (ref 6–23)
CO2: 32 mEq/L (ref 19–32)
Calcium: 9.6 mg/dL (ref 8.4–10.5)
Chloride: 103 mEq/L (ref 96–112)
Creatinine, Ser: 1.12 mg/dL (ref 0.40–1.50)
GFR: 65.02 mL/min (ref >60.00)
Glucose, Bld: 115 mg/dL — ABNORMAL HIGH (ref 70–99)
Potassium: 4.5 mEq/L (ref 3.5–5.1)
Sodium: 142 mEq/L (ref 135–145)
Total Bilirubin: 0.5 mg/dL (ref 0.2–1.2)
Total Protein: 6.4 g/dL (ref 6.0–8.3)

## 2020-12-03 LAB — LIPID PANEL
Cholesterol: 212 mg/dL — ABNORMAL HIGH (ref 0–200)
HDL: 55.4 mg/dL (ref >39.00)
LDL Cholesterol: 125 mg/dL — ABNORMAL HIGH (ref 0–99)
NonHDL: 156.33
Total CHOL/HDL Ratio: 4
Triglycerides: 156 mg/dL — ABNORMAL HIGH (ref 0.0–149.0)
VLDL: 31.2 mg/dL (ref 0.0–40.0)

## 2020-12-03 LAB — HEMOGLOBIN A1C: Hgb A1c MFr Bld: 7 % — ABNORMAL HIGH (ref 4.6–6.5)

## 2020-12-03 MED ORDER — AMLODIPINE BESYLATE 5 MG PO TABS
5.0000 mg | ORAL_TABLET | Freq: Every day | ORAL | 1 refills | Status: DC
Start: 2020-12-03 — End: 2020-12-17

## 2020-12-03 MED ORDER — ESCITALOPRAM OXALATE 10 MG PO TABS: 10.0000 mg | ORAL_TABLET | Freq: Every day | ORAL | 3 refills | Status: DC

## 2020-12-03 MED ORDER — FUROSEMIDE 40 MG PO TABS: 40.0000 mg | ORAL_TABLET | Freq: Every day | ORAL | 3 refills | Status: DC

## 2020-12-03 MED ORDER — FENOFIBRATE 145 MG PO TABS: 145.0000 mg | ORAL_TABLET | Freq: Every day | ORAL | 3 refills | Status: DC

## 2020-12-03 MED ORDER — EZETIMIBE 10 MG PO TABS: 10.0000 mg | ORAL_TABLET | Freq: Every day | ORAL | 3 refills | Status: DC

## 2020-12-03 MED ORDER — NITROGLYCERIN 0.4 MG SL SUBL: SUBLINGUAL_TABLET | SUBLINGUAL | 6 refills | Status: DC

## 2020-12-03 MED ORDER — LISINOPRIL 40 MG PO TABS: ORAL_TABLET | ORAL | 3 refills | Status: DC

## 2020-12-03 MED ORDER — CLOPIDOGREL BISULFATE 75 MG PO TABS: 75.0000 mg | ORAL_TABLET | Freq: Every day | ORAL | 3 refills | Status: DC

## 2020-12-03 NOTE — Progress Notes (Signed)
OFFICE VISIT  12/03/2020  CC:  Chief Complaint  Patient presents with   Follow-up    RCI, pt is fasting   HPI:    Patient is a 75 y.o. Caucasian Richard [name] who presents unaccompanied today for 3 mo f/u DM, HTN, HLD. He has CAD with hx of MI and stent placement, preserved EF but has DD.  A/P as of last visit: "1) DM, with CAD. Hba1c, lytes/cr, and urine microalb/cr today.   2) HTN: stable on lisinopril 40 qd, toprol xl 100 qd, imdur 30 qd. Lytes/cr today.   3) HLD: goal LDL <70.  He is on maximum statin therapy + fibrate.   Last LDL 3 mo ago was improved->110. FLP and hepatic panel today.  If LDL still >70 then will recommend zetia added to current therapy.   4) CAD; hx of MI and stent, preserved EF but has DD. Some recent mild/brief CP that I do not think is [place].  Small vessel dz likely. No new cardiac testing at this time. Signs/symptoms to call or return for were reviewed and pt expressed understanding. Cont ASA, plavix, BB, statin, imdur.  Has sl NTG for prn use.   5) Bladder cancer: he is s/p TURBT about 4 mo ago. Will be following up soon with urol for relook cystoscopy."  INTERIM HX: Doing ok today.  Intermittently feels "smothered" after going up steps, takes lasix a couple days in a row and this feeling of SOB resolves.  Doesn't see much LE swelling.  Most recent lasix dose was 2 d ago (20mg , his wife's). He had been on 40mg  qd in past but ran out.  He has occasionally (? 1x/mo) had some nonexertional midline SSCP over the last year or so, mild and short lived but he doesn't recall many specifics.  Then last week had 2 episodes of this, again nonexertional, no arm pain, no nausea, no SOB, no jaw pain.  He did not have any sl ntg.  It lasted about 35min and faded away.  HLD: LDL was 115 last visit so I recommended adding zetia 10mg  qd to his atorva 80 qd and fenofibrate 145 qd. He is tolerating these meds fine.  DM: a1c stable at 6.9% last visit.  No home bp or  glucose monitoring.  ROS as above, plus--> no fevers, no wheezing, no cough, no dizziness, no HAs, no rashes, no melena/hematochezia.  No polyuria or polydipsia.  No myalgias or arthralgias.  No focal weakness, paresthesias, or tremors.  No acute vision or hearing abnormalities.  No dysuria or unusual/new urinary urgency or frequency.  No recent changes in lower legs. No n/v/d or abd pain.  No palpitations.    Past Medical History:  Diagnosis Date   Coronary artery disease    a. DES to LAD 04/2011 - took Effient/ASA x 1 yr, now on ASA only. b. Cath 10/2012: patent stent, otherwise normal cors. c. 05/2018 Prox LAD stent 75% restenosed, DES placed.  08/30/19 cath: patent stent, mild distal LAD dz not requiring intervention, LVEF nl, EDP normal: maximize antianginal meds.   COVID 04/04/2020   asymptomatic   Diabetes mellitus with complication (Richard Richard [name]) 48/1856   A1c 6.9%   GERD (gastroesophageal reflux disease)    HAS HAD ESOPHAGUS STRETCHED SEVERAL TIMES IN THE PAST   History of bladder cancer 04/2020   TURBT 04/2020.  Surveillance cysto clear 07/2020 and 10/2020   Hyperlipidemia    goal LDL < 70   Hypertension    Myocardial infarction ([place]) 04/21/2016  OSA on CPAP 10/18/2011   doesn't wear it   Poor historian    talk to dtr Richard Richard [name] cell [phone]   Right knee pain    RIGHT KNEE MEDIAL MENSICAL TEAR    Past Surgical History:  Procedure Laterality Date   CARDIAC CATHETERIZATION  06/2017   In-stent restenosis cleared out   CARDIOVASCULAR STRESS TEST     Latter part of 2019--normal per cardiologist's note from 10/2017   [place] and 1993   X 2   SURGERIES    SLIGHT LIMITATION ROM   COLONOSCOPY  11/24/2004; 2018   NORMAL.  Recall 2016.  Repeat 2018->tubular adenoma x 1 (Dr. Cindee Salt, Dig [facility]a spec).   CORONARY ANGIOPLASTY WITH STENT PLACEMENT     CORONARY STENT INTERVENTION N/A 05/22/2018   Prox LAD for in stent restenosis.  DAPT x 1 yr.  Procedure: CORONARY STENT INTERVENTION;   Surgeon: Jettie Booze, MD;  Location: [facility];  Service: Cardiovascular;  Laterality: N/A;   CYSTOSCOPY W/ RETROGRADES Bilateral 04/22/2020   Procedure: CYSTOSCOPY WITH RETROGRADE PYELOGRAM;  Surgeon: Remi Haggard, MD;  Location: [facility];  Service: Urology;  Laterality: Bilateral;  30 MINS   ESOPHAGOGASTRODUODENOSCOPY  05/2016   mild reactive changes   INTRAVASCULAR ULTRASOUND/IVUS N/A 05/22/2018   Procedure: Intravascular Ultrasound/IVUS;  Surgeon: Jettie Booze, MD;  Location: [facility];  Service: Cardiovascular;  Laterality: N/A;   KNEE ARTHROSCOPY  10/22/2011   Procedure: ARTHROSCOPY KNEE;  Surgeon: Gearlean Alf, MD;  Location: [facility];  Service: Orthopedics;  Laterality: Right;  Right knee scope with debridement   LAPAROSCOPIC CHOLECYSTECTOMY  2018   LEFT HEART CATH AND CORONARY ANGIOGRAPHY N/A 05/22/2018   DES placed for in stent restenosis.  EF 55-60%.  Procedure: LEFT HEART CATH AND CORONARY ANGIOGRAPHY;  Surgeon: Jettie Booze, MD;  Location: [facility];  Service: Cardiovascular;  Laterality: N/A;   LEFT HEART CATH AND CORONARY ANGIOGRAPHY N/A 08/30/2019   No change from 2020 cath->patent LAD stent, mild distal LAD dz.  No intervention required. Nitrates + possible future CCB antianginal for poss microvasc dz. Procedure: LEFT HEART CATH AND CORONARY ANGIOGRAPHY;  Surgeon: Jettie Booze, MD;  Location: [facility];  Service: Cardiovascular;  Laterality: N/A;   LEFT HEART CATHETERIZATION WITH CORONARY ANGIOGRAM N/A 10/08/2012   Procedure: LEFT HEART CATHETERIZATION WITH CORONARY ANGIOGRAM;  Surgeon: Lorretta Harp, MD;  Location: [facility];  Service: Cardiovascular;  Laterality: N/A;   NASAL SINUS SURGERY  yrs ago   ONE SINUS SURGERY THRU NOSE AND ANOTHER SINUS SURGERY THRU INCISION ABOVE RT EYE   TRANSTHORACIC ECHOCARDIOGRAM  05/21/2018   EF 60-65%, grd I DD, no valvular probs.   TRANSTHORACIC  ECHOCARDIOGRAM  05/21/2018   EF 60-65%, normal wall motion, +DD, normal valves and RV fxn.   TRANSURETHRAL RESECTION OF BLADDER TUMOR N/A 04/22/2020   PATH: NONinvasive high grade uroepithelial malignancy. Procedure: TRANSURETHRAL RESECTION OF BLADDER TUMOR (TURBT); FULGERATION;  Surgeon: Remi Haggard, MD;  Location: [facility];  Service: Urology;  Laterality: N/A;    Outpatient Medications Prior to Visit  Medication Sig Dispense Refill   acetaminophen (TYLENOL) 500 MG tablet Take 1,500 mg by mouth every 6 (six) hours as needed for mild pain or moderate pain (Sinus).     Apoaequorin (PREVAGEN) 10 MG CAPS Take 10 mg by mouth daily.     aspirin EC 81 MG EC tablet Take  1 tablet (81 mg total) by mouth daily.     atorvastatin (LIPITOR) 80 MG tablet TAKE 1 TABLET (80 MG TOTAL) BY MOUTH DAILY AT 6 PM. 90 tablet 1   cholecalciferol (VITAMIN D) 1000 UNITS tablet Take 2,000 Units by mouth every evening.     gabapentin (NEURONTIN) 300 MG capsule TAKE 1 CAPSULE BY MOUTH TWICE A DAY 180 capsule 1   isosorbide mononitrate (IMDUR) 30 MG 24 hr tablet TAKE 1/2 TABLET (15 MG TOTAL) BY MOUTH DAILY. 45 tablet 0   metFORMIN (GLUCOPHAGE) 500 MG tablet Take 1 tablet (500 mg total) by mouth 2 (two) times daily with a meal.     metoprolol succinate (TOPROL-XL) 100 MG 24 hr tablet TAKE 1 TABLET (100 MG TOTAL) BY MOUTH DAILY. TAKE WITH OR IMMEDIATELY FOLLOWING A MEAL. (Patient taking differently: at bedtime.) 90 tablet 0   pantoprazole (PROTONIX) 40 MG tablet Take 40 mg by mouth every evening.     vitamin B-12 (CYANOCOBALAMIN) 1000 MCG tablet Take 1,000 mcg by mouth every other day.     furosemide (LASIX) 40 MG tablet Take 40 mg by mouth daily. Take every  day except: Sunday     dicyclomine (BENTYL) 10 MG capsule TAKE 1 CAPSULE (10 MG TOTAL) BY MOUTH 4 (FOUR) TIMES DAILY - BEFORE MEALS AND AT BEDTIME. (Patient not taking: No sig reported) 360 capsule 1   phenylephrine (SINUS RELIEF EXTRA STRENGTH) 1 %  nasal spray Place 1 drop into both nostrils every 6 (six) hours as needed for congestion. (Patient not taking: No sig reported)     clopidogrel (PLAVIX) 75 MG tablet TAKE 1 TABLET BY MOUTH DAILY WITH BREAKFAST. 30 tablet 0   escitalopram (LEXAPRO) 10 MG tablet Take 1 tablet (10 mg total) by mouth daily. 90 tablet 3   ezetimibe (ZETIA) 10 MG tablet Take 1 tablet (10 mg total) by mouth daily. 30 tablet 2   fenofibrate (TRICOR) 145 MG tablet TAKE 1 TABLET BY MOUTH EVERY DAY 30 tablet 0   lisinopril (ZESTRIL) 40 MG tablet TAKE 1 TABLET (40 MG TOTAL) BY MOUTH DAILY. PLEASE KEEP UPCOMING APPT IN JULY BEFORE ANYMORE REFILLS (Patient taking differently: at bedtime.) 90 tablet 1   nitroGLYCERIN (NITROSTAT) 0.4 MG SL tablet PLACE 1 TABLET UNDER THE TONGUE EVERY 5 MINUTES X 3 DOSES AS NEEDED FOR CHEST PAIN. (Patient not taking: No sig reported) 25 tablet 6   No facility-administered medications prior to visit.    No Known Allergies  ROS As per HPI  PE: Vitals with BMI 12/03/2020 08/20/2020 05/21/2020  Height 5\' 7"  5\' 7"  5\' 7"   Weight 254 lbs 10 oz 253 lbs 6 oz 249 lbs 10 oz  BMI 39.87 66.06 30.16  Systolic 010 932 355  Diastolic 82 74 Richard  Pulse 59 56 53  Manual bp at end of visit was 160/88. Gen: Alert, well appearing.  Patient is oriented to person, place, time, and situation. AFFECT: pleasant, lucid thought and speech. CV: RRR, no m/r/g.   LUNGS: CTA bilat, nonlabored resps, good aeration in all lung fields. EXT: no clubbing or cyanosis.  no edema.    LABS:  Lab Results  Component Value Date   TSH 1.54 08/24/2019   Lab Results  Component Value Date   WBC 6.8 02/20/2020   HGB 14.3 04/22/2020   HCT 42.0 04/22/2020   MCV 95.4 02/20/2020   PLT 184.0 02/20/2020   Lab Results  Component Value Date   CREATININE 1.15 08/20/2020   BUN 19 08/20/2020  NA 143 08/20/2020   K 4.3 08/20/2020   CL 106 08/20/2020   CO2 28 08/20/2020   Lab Results  Component Value Date   ALT 10 08/20/2020    AST 12 08/20/2020   ALKPHOS 33 (L) 08/20/2020   BILITOT 0.5 08/20/2020   Lab Results  Component Value Date   CHOL 195 08/20/2020   Lab Results  Component Value Date   HDL 49.70 08/20/2020   Lab Results  Component Value Date   LDLCALC 115 (H) 08/20/2020   Lab Results  Component Value Date   TRIG 153.0 (H) 08/20/2020   Lab Results  Component Value Date   CHOLHDL 4 08/20/2020   Lab Results  Component Value Date   PSA 1.00 02/20/2020   Lab Results  Component Value Date   HGBA1C 6.9 (H) 08/20/2020   IMPRESSION AND PLAN:  1) DM: has been well controlled on metformin 500 bid. Hba1c and lytes/cr today.  2) HTN: uncontrolled. Add amlodipine 5mg  qd and continue lisin 40 qd and toprol xl 100 qd. Lytes/cr today.  3) HLD: tolerating high dose statin, zetia, and fenofibrate. FLP and hepatic panel today.  4) CAD, hx of MI and stent, has chronic diastolic HF. Has been off daily lasix b/c ran out.  We'll get him back on 40mg  qd. Unclear if [place] but I did advise him of need for routine cardiology f/u--we assisted pt in making appt today.  No testing at this time. Cont ASA, plavix, imdur, BB, ACE-I.  RF'd sl ntg today.  An After Visit Summary was printed and given to the patient.  FOLLOW UP: Return in about 2 weeks (around 12/17/2020) for f/u HTN.check bmet Cpe 02/2021  Signed:  Crissie Sickles, MD           12/03/2020

## 2020-12-04 ENCOUNTER — Telehealth: Payer: Self-pay | Admitting: Family Medicine

## 2020-12-04 NOTE — Telephone Encounter (Signed)
No answer unable to leave a message for patient to call back and schedule Medicare Annual Wellness Visit (AWV) by video or phone  No history of AWV -eligible for AWVI as of 10/01/2019  Please schedule at any time with [facility].

## 2020-12-08 ENCOUNTER — Other Ambulatory Visit: Payer: Self-pay

## 2020-12-08 ENCOUNTER — Encounter (HOSPITAL_BASED_OUTPATIENT_CLINIC_OR_DEPARTMENT_OTHER): Payer: Self-pay | Admitting: Family

## 2020-12-08 ENCOUNTER — Ambulatory Visit (INDEPENDENT_AMBULATORY_CARE_PROVIDER_SITE_OTHER): Payer: HMO | Admitting: Family

## 2020-12-08 VITALS — BP 132/70 | HR 67 | Ht 67.0 in | Wt 250.0 lb

## 2020-12-08 DIAGNOSIS — I25118 Atherosclerotic heart disease of native coronary artery with other forms of angina pectoris: Secondary | ICD-10-CM | POA: Diagnosis not present

## 2020-12-08 DIAGNOSIS — E785 Hyperlipidemia, unspecified: Secondary | ICD-10-CM | POA: Diagnosis not present

## 2020-12-08 DIAGNOSIS — I10 Essential (primary) hypertension: Secondary | ICD-10-CM | POA: Diagnosis not present

## 2020-12-08 NOTE — Patient Instructions (Addendum)
Medication Instructions:  Continue your current medications.   Please check you are taking Atorvastatin (Lipitor) and Ezetimibe (Zetia) at home. Please call us and let us know.   *If you need a refill on your cardiac medications before your next appointment, please call your pharmacy*  Lab Work: None ordered today.   Your recent cholesterol panel showed your LDL (bad cholesterol) was 125. Our goal is for it to be less than 70. Because it has increased we anticipate you have been missing one of your cholesterol medications.   Testing/Procedures: Your EKG today shows normal sinus rhythm.   Follow-Up: At [facility], you and your health needs are our priority.  As part of our continuing mission to provide you with exceptional heart care, we have created designated Provider Care Teams.  These Care Teams include your primary Cardiologist (physician) and Advanced Practice Providers (APPs -  Physician Assistants and Nurse Practitioners) who all work together to provide you with the care you need, when you need it.  We recommend signing up for the patient portal called "MyChart".  Sign up information is provided on this After Visit Summary.  MyChart is used to connect with patients for Virtual Visits (Telemedicine).  Patients are able to view lab/test results, encounter notes, upcoming appointments, etc.  Non-urgent messages can be sent to your provider as well.   To learn more about what you can do with MyChart, go to [email or web address].    Your next appointment:   6 month(s)  The format for your next appointment:   In Person  Provider:   You may see Larae Grooms, MD or one of the following Advanced Practice Providers on your designated Care Team:   Melina Copa, PA-C Ermalinda Barrios, PA-C  Other Instructions  Heart Healthy Diet Recommendations: A low-salt diet is recommended. Meats should be grilled, baked, or boiled. Avoid fried foods. Focus on lean protein sources like fish or  chicken with vegetables and fruits. The American Heart Association is a Microbiologist!    Exercise recommendations: The American Heart Association recommends 150 minutes of moderate intensity exercise weekly. Try 30 minutes of moderate intensity exercise 4-5 times per week. This could include walking, jogging, or swimming.

## 2020-12-08 NOTE — Progress Notes (Signed)
Office Visit    Patient Name: Richard [name] Date of Encounter: 12/08/2020  PCP:  Tammi Sou, MD   [place]  Cardiologist:  Larae Grooms, MD  Advanced Practice Provider:  No care team member to display Electrophysiologist:  None   Chief Complaint    Richard [name] is a 75 y.o. male with a hx of CAD (PCI to LAD 2013), HLD, HTN, DM2, GERD presents today for chest pain   Past Medical History    Past Medical History:  Diagnosis Date   Coronary artery disease    a. DES to LAD 04/2011 - took Effient/ASA x 1 yr, now on ASA only. b. Cath 10/2012: patent stent, otherwise normal cors. c. 05/2018 Prox LAD stent 75% restenosed, DES placed.  08/30/19 cath: patent stent, mild distal LAD dz not requiring intervention, LVEF nl, EDP normal: maximize antianginal meds.   COVID 04/04/2020   asymptomatic   Diabetes mellitus with complication ([place]) 77/9390   A1c 6.9%   GERD (gastroesophageal reflux disease)    HAS HAD ESOPHAGUS STRETCHED SEVERAL TIMES IN THE PAST   History of bladder cancer 04/2020   TURBT 04/2020.  Surveillance cysto clear 07/2020 and 10/2020   Hyperlipidemia    goal LDL < 70   Hypertension    Myocardial infarction ([place]) 04/21/2016   OSA on CPAP 10/18/2011   doesn't wear it   Poor historian    talk to dtr pam hooker cell [phone]   Right knee pain    RIGHT KNEE MEDIAL MENSICAL TEAR   Past Surgical History:  Procedure Laterality Date   CARDIAC CATHETERIZATION  06/2017   In-stent restenosis cleared out   CARDIOVASCULAR STRESS TEST     Latter part of 2019--normal per cardiologist's note from 10/2017   [place] and 1993   X 2   SURGERIES    SLIGHT LIMITATION ROM   COLONOSCOPY  11/24/2004; 2018   NORMAL.  Recall 2016.  Repeat 2018->tubular adenoma x 1 (Dr. Cindee Salt, Dig hea spec).   CORONARY ANGIOPLASTY WITH STENT PLACEMENT     CORONARY STENT INTERVENTION N/A 05/22/2018   Prox LAD for in stent restenosis.  DAPT x 1 yr.   Procedure: CORONARY STENT INTERVENTION;  Surgeon: Jettie Booze, MD;  Location: [place] [facility];  Service: Cardiovascular;  Laterality: N/A;   CYSTOSCOPY W/ RETROGRADES Bilateral 04/22/2020   Procedure: CYSTOSCOPY WITH RETROGRADE PYELOGRAM;  Surgeon: Remi Haggard, MD;  Location: [facility];  Service: Urology;  Laterality: Bilateral;  30 MINS   ESOPHAGOGASTRODUODENOSCOPY  05/2016   mild reactive changes   INTRAVASCULAR ULTRASOUND/IVUS N/A 05/22/2018   Procedure: Intravascular Ultrasound/IVUS;  Surgeon: Jettie Booze, MD;  Location: [place] [facility];  Service: Cardiovascular;  Laterality: N/A;   KNEE ARTHROSCOPY  10/22/2011   Procedure: ARTHROSCOPY KNEE;  Surgeon: Gearlean Alf, MD;  Location: [facility];  Service: Orthopedics;  Laterality: Right;  Right knee scope with debridement   LAPAROSCOPIC CHOLECYSTECTOMY  2018   LEFT HEART CATH AND CORONARY ANGIOGRAPHY N/A 05/22/2018   DES placed for in stent restenosis.  EF 55-60%.  Procedure: LEFT HEART CATH AND CORONARY ANGIOGRAPHY;  Surgeon: Jettie Booze, MD;  Location: [place] [facility];  Service: Cardiovascular;  Laterality: N/A;   LEFT HEART CATH AND CORONARY ANGIOGRAPHY N/A 08/30/2019   No change from 2020 cath->patent LAD stent, mild distal LAD dz.  No intervention required. Nitrates + possible future CCB antianginal for  poss microvasc dz. Procedure: LEFT HEART CATH AND CORONARY ANGIOGRAPHY;  Surgeon: Jettie Booze, MD;  Location: [place] [facility];  Service: Cardiovascular;  Laterality: N/A;   LEFT HEART CATHETERIZATION WITH CORONARY ANGIOGRAM N/A 10/08/2012   Procedure: LEFT HEART CATHETERIZATION WITH CORONARY ANGIOGRAM;  Surgeon: Lorretta Harp, MD;  Location: [facility];  Service: Cardiovascular;  Laterality: N/A;   NASAL SINUS SURGERY  yrs ago   ONE SINUS SURGERY THRU NOSE AND ANOTHER SINUS SURGERY THRU INCISION ABOVE RT EYE   TRANSTHORACIC ECHOCARDIOGRAM  05/21/2018   EF 60-65%, grd I  DD, no valvular probs.   TRANSTHORACIC ECHOCARDIOGRAM  05/21/2018   EF 60-65%, normal wall motion, +DD, normal valves and RV fxn.   TRANSURETHRAL RESECTION OF BLADDER TUMOR N/A 04/22/2020   PATH: NONinvasive high grade uroepithelial malignancy. Procedure: TRANSURETHRAL RESECTION OF BLADDER TUMOR (TURBT); FULGERATION;  Surgeon: Remi Haggard, MD;  Location: [facility];  Service: Urology;  Laterality: N/A;    Allergies  No Known Allergies  History of Present Illness    Richard [name] is a 75 y.o. male with a hx of CAD (PCI to LAD 2013), HLD, HTN, DM2, GERD, bladder cancer s/p TURBT last seen 08-30-2020 for cardiac catheterization.  CAD dates back to PCI to LAD in 2013. Repeat cardiac cath 2014 with patent stent. Cardiac cath 05/2018 with severe ISR in previously stented LAD with another DES placed. Noted minor nonobstructive distal LM stenosis 20% and patent RCA, LCx. LVEF has remained normal. He was seen 08/23/20 by Dr. Burt Knack as DOD due to chest pain. Underwent subsequent LHC 2020-08-30 with Dr. Irish Lack showing patent LAD stent, distal LM 20%, mid LAD 25%, normal LVEF, no aortic valve stenosis. This was unchanged from 2020 catheterization.   He was seen by primary care last week with elevated BP 167/82 with episodes of chest pain. It was presumed due to small vessel disease. He was noted to be out of Lasix and this was resumed.   He presents today for cardiology follow up with his wife. 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 Tells me he enjoys spending time with his 38 year old great grandson. Notes last week he had a short episode of chest pain which occurred at rest and spontaneously resolved. It has resolved since resuming his Lasix and notes improvement in his lower extremity edema. Reports no shortness of breath and stable mild dyspnea with more than usual activity such as carrying laundry up from the basement. Reports no orthopnea, PND.   EKGs/Labs/Other Studies Reviewed:    The following studies were reviewed today: LHC 08/30/2020 Dist LM lesion is 20% stenosed. Mid LAD lesion is 25% stenosed. Previously placed Prox LAD drug eluting stent is widely patent. The left ventricular systolic function is normal. LV end diastolic pressure is normal. The left ventricular ejection fraction is 55-65% by visual estimate. There is no aortic valve stenosis.   Patent stents.  Nonobstructive disease.  No change from end of 2020 cath.   Continue medical therapy with nitrates.  If symptoms persist, could also consider verapamil as an antianginal to treat potential microvascular disease.   EKG:  EKG is ordered today.  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 No acute St/T wave changes.   Recent Labs: 02/20/2020: Platelets 184.0 04/22/2020: Hemoglobin 14.3 12/03/2020: ALT 11; BUN 18; Creatinine, Ser 1.12; Potassium 4.5; Sodium 142  Recent Lipid Panel    Component Value Date/Time   CHOL 212 (H)  12/03/2020 1012   CHOL 110 08/18/2018 1010   TRIG 156.0 (H) 12/03/2020 1012   HDL 55.40 12/03/2020 1012   HDL 39 (L) 08/18/2018 1010   CHOLHDL 4 12/03/2020 1012   VLDL 31.2 12/03/2020 1012   LDLCALC 125 (H) 12/03/2020 1012   LDLCALC 27 08/18/2018 1010   LDLDIRECT 145.0 02/20/2020 1109   Home Medications   Current Meds  Medication Sig   acetaminophen (TYLENOL) 500 MG tablet Take 1,500 mg by mouth every 6 (six) hours as needed for mild pain or moderate pain (Sinus).   amLODipine (NORVASC) 5 MG tablet Take 1 tablet (5 mg total) by mouth daily.   Apoaequorin (PREVAGEN) 10 MG CAPS Take 10 mg by mouth daily.   aspirin EC 81 MG EC tablet Take 1 tablet (81 mg total) by mouth daily.   atorvastatin (LIPITOR) 80 MG tablet TAKE 1 TABLET (80 MG TOTAL) BY MOUTH DAILY AT 6 PM.   cholecalciferol (VITAMIN D) 1000 UNITS tablet Take 2,000 Units by mouth every evening.   clopidogrel (PLAVIX) 75 MG tablet Take 1 tablet (75 mg total) by mouth daily with breakfast.    escitalopram (LEXAPRO) 10 MG tablet Take 1 tablet (10 mg total) by mouth daily.   ezetimibe (ZETIA) 10 MG tablet Take 1 tablet (10 mg total) by mouth daily.   fenofibrate (TRICOR) 145 MG tablet Take 1 tablet (145 mg total) by mouth daily.   furosemide (LASIX) 40 MG tablet Take 1 tablet (40 mg total) by mouth daily. Take every  day except: Sunday   gabapentin (NEURONTIN) 300 MG capsule TAKE 1 CAPSULE BY MOUTH TWICE A DAY   isosorbide mononitrate (IMDUR) 30 MG 24 hr tablet TAKE 1/2 TABLET (15 MG TOTAL) BY MOUTH DAILY.   lisinopril (ZESTRIL) 40 MG tablet TAKE 1 TABLET (40 MG TOTAL) BY MOUTH DAILY.   metFORMIN (GLUCOPHAGE) 500 MG tablet Take 1 tablet (500 mg total) by mouth 2 (two) times daily with a meal.   metoprolol succinate (TOPROL-XL) 100 MG 24 hr tablet TAKE 1 TABLET (100 MG TOTAL) BY MOUTH DAILY. TAKE WITH OR IMMEDIATELY FOLLOWING A MEAL.   nitroGLYCERIN (NITROSTAT) 0.4 MG SL tablet PLACE 1 TABLET UNDER THE TONGUE EVERY 5 MINUTES X 3 DOSES AS NEEDED FOR CHEST PAIN.   pantoprazole (PROTONIX) 40 MG tablet Take 40 mg by mouth every evening.   phenylephrine (SINUS RELIEF EXTRA STRENGTH) 1 % nasal spray Place 1 drop into both nostrils every 6 (six) hours as needed for congestion.   vitamin B-12 (CYANOCOBALAMIN) 1000 MCG tablet Take 1,000 mcg by mouth every other day.    Review of Systems     All other systems reviewed and are otherwise negative except as noted above.  Physical Exam    VS:  BP 132/70   Pulse 67   Ht 5\' 7"  (1.702 m)   Wt 250 lb (113.4 kg)   SpO2 96%   BMI 39.16 kg/m  , BMI Body mass index is 39.16 kg/m.  Wt Readings from Last 3 Encounters:  12/08/20 250 lb (113.4 kg)  12/03/20 254 lb 9.6 oz (115.5 kg)  08/20/20 253 lb 6.4 oz (114.9 kg)     GEN: Well nourished, overweight,  well developed, in no acute distress. HEENT: normal. Neck: Supple, no JVD, carotid bruits, or masses. Cardiac: RRR, no murmurs, rubs, or gallops. No clubbing, cyanosis, edema.  Radials/PT 2+ and  equal bilaterally.  Respiratory:  Respirations regular and unlabored, clear to auscultation bilaterally. GI: Soft, nontender, nondistended. MS: No deformity  or atrophy. Skin: Warm and dry, no rash. Neuro:  Strength and sensation are intact. Psych: Normal affect.  Assessment & Plan   CAD - Stable with no anginal symptoms. No indication for ischemic evaluation.  LHC 08/2019 with patent stent and otherwise nonobstructive disease. Few episodes of chest pain last week in setting of being out of diuretic likely due to diastolic dysfunction. GDMT includes Plavix, Aspirin, Lipitor, Zetia, Metoprolol. PRN nitroglycerin. Heart healthy diet and regular cardiovascular exercise encouraged.    HTN - BP reasonably well controlled. Continue Lisinopril 40mg  QD, Toprol 100mg  QD, Imdur 30mg  QD. If BP elevated, could consider increased dose Imdur.  DM2 - Continue to follow with PCP. If additional agent needed, consider SGLT2i for cardioprotective benefit.   HLD - LDL goal <70. Recently started on Zetia by PCP. I did as him to check his pill box to ensure he has been taking Atorvastatin, his daughter manages his medications. If LDL not at goal despite addition of Zetia, will require PCSK9i.  Disposition: Follow up in 6 month(s) with Dr. Irish Lack or APP.  Signed, Loel Dubonnet, NP 12/08/2020, 1:44 PM [facility]

## 2020-12-17 ENCOUNTER — Encounter: Payer: Self-pay | Admitting: Family Medicine

## 2020-12-17 ENCOUNTER — Ambulatory Visit (INDEPENDENT_AMBULATORY_CARE_PROVIDER_SITE_OTHER): Payer: HMO | Admitting: Family Medicine

## 2020-12-17 ENCOUNTER — Other Ambulatory Visit: Payer: Self-pay

## 2020-12-17 VITALS — BP 115/74 | HR 65 | Temp 98.2°F | Ht 67.0 in | Wt 252.6 lb

## 2020-12-17 DIAGNOSIS — F4322 Adjustment disorder with anxiety: Secondary | ICD-10-CM | POA: Diagnosis not present

## 2020-12-17 DIAGNOSIS — I10 Essential (primary) hypertension: Secondary | ICD-10-CM | POA: Diagnosis not present

## 2020-12-17 DIAGNOSIS — F411 Generalized anxiety disorder: Secondary | ICD-10-CM | POA: Diagnosis not present

## 2020-12-17 MED ORDER — AMLODIPINE BESYLATE 5 MG PO TABS: 5.0000 mg | ORAL_TABLET | Freq: Every day | ORAL | 3 refills | Status: DC

## 2020-12-17 MED ORDER — ESCITALOPRAM OXALATE 20 MG PO TABS: 20.0000 mg | ORAL_TABLET | Freq: Every day | ORAL | 1 refills | Status: DC

## 2020-12-17 NOTE — Progress Notes (Signed)
OFFICE VISIT  12/17/2020  CC:  Chief Complaint  Patient presents with   Hypertension    Follow up;   HPI:    Patient is a 75 y.o. male who presents for 2 wk f/u uncontrolled HTN. A/P as of last visit: "1) DM: has been well controlled on metformin 500 bid. Hba1c and lytes/cr today.   2) HTN: uncontrolled. Add amlodipine 5mg  qd and continue lisin 40 qd and toprol xl 100 qd. Lytes/cr today.   3) HLD: tolerating high dose statin, zetia, and fenofibrate. FLP and hepatic panel today.   4) CAD, hx of MI and stent, has chronic diastolic HF. Has been off daily lasix b/c ran out.  We'll get him back on 40mg  qd. Unclear if [place] but I did advise him of need for routine cardiology f/u--we assisted pt in making appt today.  No testing at this time. Cont ASA, plavix, imdur, BB, ACE-I.  RF'd sl ntg today."  INTERIM HX: He is tolerating the amlodipine without problem.  He does not check his blood pressure at home.  He is struggling with increased anxiety and "short fuse".  Says he has always been a worrier but certainly it is worse lately with his wife being diagnosed with lupus and being chronically ill.  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  His daughter is addicted to drugs and is in jail currently.  He has been on 10 mg Lexapro and did initially get a little bit of response.  He denies depression.  LDL was still elevated on labs 2 wks ago, he is already on max tx with statin, fibrate, and zetia. Not sure of good compliance. Need to consider ref to advanced lipid clinic if LDL still up at next check in 3 mo.  ROS as above, plus--> no fevers, no CP, no SOB, no wheezing, no cough, no dizziness, no HAs, no rashes, no melena/hematochezia.  No polyuria or polydipsia.  No myalgias or arthralgias.  No focal weakness, paresthesias, or tremors.  No acute vision or hearing abnormalities.  No dysuria or unusual/new urinary urgency or frequency.  No recent changes in  lower legs. No n/v/d or abd pain.  No palpitations.    Past Medical History:  Diagnosis Date   Coronary artery disease    a. DES to LAD 04/2011 - took Effient/ASA x 1 yr, now on ASA only. b. Cath 10/2012: patent stent, otherwise normal cors. c. 05/2018 Prox LAD stent 75% restenosed, DES placed.  08/30/19 cath: patent stent, mild distal LAD dz not requiring intervention, LVEF nl, EDP normal: maximize antianginal meds.   COVID 04/04/2020   asymptomatic   Diabetes mellitus with complication ([place]) 16/1096   A1c 6.9%   GERD (gastroesophageal reflux disease)    HAS HAD ESOPHAGUS STRETCHED SEVERAL TIMES IN THE PAST   History of bladder cancer 04/2020   TURBT 04/2020.  Surveillance cysto clear 07/2020 and 10/2020   Hyperlipidemia    goal LDL < 70   Hypertension    Myocardial infarction ([place]) 04/21/2016   OSA on CPAP 10/18/2011   doesn't wear it   Poor historian    talk to dtr pam hooker cell [phone]   Right knee pain    RIGHT KNEE MEDIAL MENSICAL TEAR    Past Surgical History:  Procedure Laterality Date   CARDIAC CATHETERIZATION  06/2017   In-stent restenosis cleared out   CARDIOVASCULAR STRESS TEST     Latter part of 2019--normal  per cardiologist's note from 10/2017   [place] and 1993   X 2   SURGERIES    SLIGHT LIMITATION ROM   COLONOSCOPY  11/24/2004; 2018   NORMAL.  Recall 2016.  Repeat 2018->tubular adenoma x 1 (Dr. Cindee Salt, Dig hea spec).   CORONARY ANGIOPLASTY WITH STENT PLACEMENT     CORONARY STENT INTERVENTION N/A 05/22/2018   Prox LAD for in stent restenosis.  DAPT x 1 yr.  Procedure: CORONARY STENT INTERVENTION;  Surgeon: Jettie Booze, MD;  Location: [place] [facility];  Service: Cardiovascular;  Laterality: N/A;   CYSTOSCOPY W/ RETROGRADES Bilateral 04/22/2020   Procedure: CYSTOSCOPY WITH RETROGRADE PYELOGRAM;  Surgeon: Remi Haggard, MD;  Location: [facility];  Service: Urology;  Laterality: Bilateral;  30 MINS    ESOPHAGOGASTRODUODENOSCOPY  05/2016   mild reactive changes   INTRAVASCULAR ULTRASOUND/IVUS N/A 05/22/2018   Procedure: Intravascular Ultrasound/IVUS;  Surgeon: Jettie Booze, MD;  Location: [place] [facility];  Service: Cardiovascular;  Laterality: N/A;   KNEE ARTHROSCOPY  10/22/2011   Procedure: ARTHROSCOPY KNEE;  Surgeon: Gearlean Alf, MD;  Location: [facility];  Service: Orthopedics;  Laterality: Right;  Right knee scope with debridement   LAPAROSCOPIC CHOLECYSTECTOMY  2018   LEFT HEART CATH AND CORONARY ANGIOGRAPHY N/A 05/22/2018   DES placed for in stent restenosis.  EF 55-60%.  Procedure: LEFT HEART CATH AND CORONARY ANGIOGRAPHY;  Surgeon: Jettie Booze, MD;  Location: [place] [facility];  Service: Cardiovascular;  Laterality: N/A;   LEFT HEART CATH AND CORONARY ANGIOGRAPHY N/A 08/30/2019   No change from 2020 cath->patent LAD stent, mild distal LAD dz.  No intervention required. Nitrates + possible future CCB antianginal for poss microvasc dz. Procedure: LEFT HEART CATH AND CORONARY ANGIOGRAPHY;  Surgeon: Jettie Booze, MD;  Location: [place] [facility];  Service: Cardiovascular;  Laterality: N/A;   LEFT HEART CATHETERIZATION WITH CORONARY ANGIOGRAM N/A 10/08/2012   Procedure: LEFT HEART CATHETERIZATION WITH CORONARY ANGIOGRAM;  Surgeon: Lorretta Harp, MD;  Location: [facility];  Service: Cardiovascular;  Laterality: N/A;   NASAL SINUS SURGERY  yrs ago   ONE SINUS SURGERY THRU NOSE AND ANOTHER SINUS SURGERY THRU INCISION ABOVE RT EYE   TRANSTHORACIC ECHOCARDIOGRAM  05/21/2018   EF 60-65%, grd I DD, no valvular probs.   TRANSTHORACIC ECHOCARDIOGRAM  05/21/2018   EF 60-65%, normal wall motion, +DD, normal valves and RV fxn.   TRANSURETHRAL RESECTION OF BLADDER TUMOR N/A 04/22/2020   PATH: NONinvasive high grade uroepithelial malignancy. Procedure: TRANSURETHRAL RESECTION OF BLADDER TUMOR (TURBT); FULGERATION;  Surgeon: Remi Haggard, MD;  Location: [facility];  Service: Urology;  Laterality: N/A;    Outpatient Medications Prior to Visit  Medication Sig Dispense Refill   acetaminophen (TYLENOL) 500 MG tablet Take 1,500 mg by mouth every 6 (six) hours as needed for mild pain or moderate pain (Sinus).     Apoaequorin (PREVAGEN) 10 MG CAPS Take 10 mg by mouth daily.     aspirin EC 81 MG EC tablet Take 1 tablet (81 mg total) by mouth daily.     atorvastatin (LIPITOR) 80 MG tablet TAKE 1 TABLET (80 MG TOTAL) BY MOUTH DAILY AT 6 PM. 90 tablet 1   cholecalciferol (VITAMIN D) 1000 UNITS tablet Take 2,000 Units by mouth every evening.     clopidogrel (PLAVIX) 75 MG tablet Take 1 tablet (75 mg total) by mouth daily with breakfast. 90 tablet 3   ezetimibe (  ZETIA) 10 MG tablet Take 1 tablet (10 mg total) by mouth daily. 90 tablet 3   fenofibrate (TRICOR) 145 MG tablet Take 1 tablet (145 mg total) by mouth daily. 90 tablet 3   furosemide (LASIX) 40 MG tablet Take 1 tablet (40 mg total) by mouth daily. Take every  day except: Sunday 90 tablet 3   gabapentin (NEURONTIN) 300 MG capsule TAKE 1 CAPSULE BY MOUTH TWICE A DAY 180 capsule 1   isosorbide mononitrate (IMDUR) 30 MG 24 hr tablet TAKE 1/2 TABLET (15 MG TOTAL) BY MOUTH DAILY. 45 tablet 0   lisinopril (ZESTRIL) 40 MG tablet TAKE 1 TABLET (40 MG TOTAL) BY MOUTH DAILY. 90 tablet 3   metFORMIN (GLUCOPHAGE) 500 MG tablet Take 1 tablet (500 mg total) by mouth 2 (two) times daily with a meal.     metoprolol succinate (TOPROL-XL) 100 MG 24 hr tablet TAKE 1 TABLET (100 MG TOTAL) BY MOUTH DAILY. TAKE WITH OR IMMEDIATELY FOLLOWING A MEAL. 90 tablet 0   pantoprazole (PROTONIX) 40 MG tablet Take 40 mg by mouth every evening.     phenylephrine (SINUS RELIEF EXTRA STRENGTH) 1 % nasal spray Place 1 drop into both nostrils every 6 (six) hours as needed for congestion.     vitamin B-12 (CYANOCOBALAMIN) 1000 MCG tablet Take 1,000 mcg by mouth every other day.     amLODipine (NORVASC) 5 MG tablet Take 1 tablet (5 mg  total) by mouth daily. 30 tablet 1   escitalopram (LEXAPRO) 10 MG tablet Take 1 tablet (10 mg total) by mouth daily. 90 tablet 3   dicyclomine (BENTYL) 10 MG capsule TAKE 1 CAPSULE (10 MG TOTAL) BY MOUTH 4 (FOUR) TIMES DAILY - BEFORE MEALS AND AT BEDTIME. (Patient not taking: No sig reported) 360 capsule 1   nitroGLYCERIN (NITROSTAT) 0.4 MG SL tablet PLACE 1 TABLET UNDER THE TONGUE EVERY 5 MINUTES X 3 DOSES AS NEEDED FOR CHEST PAIN. (Patient not taking: Reported on 12/17/2020) 25 tablet 6   No facility-administered medications prior to visit.   No Known Allergies  ROS As per HPI  PE: Vitals with BMI 12/17/2020 12/08/2020 12/03/2020  Height 5\' 7"  5\' 7"  5\' 7"   Weight 252 lbs 10 oz 250 lbs 254 lbs 10 oz  BMI 39.55 32.20 25.42  Systolic [phone]  Diastolic 74 70 82  Pulse 65 67 59   Gen: Alert, well appearing.  Patient is oriented to person, place, time, and situation. AFFECT: pleasant, lucid thought and speech. CV: RRR, no m/r/g.   LUNGS: CTA bilat, nonlabored resps, good aeration in all lung fields.   LABS:  Lab Results  Component Value Date   TSH 1.54 08/24/2019   Lab Results  Component Value Date   WBC 6.8 02/20/2020   HGB 14.3 04/22/2020   HCT 42.0 04/22/2020   MCV 95.4 02/20/2020   PLT 184.0 02/20/2020   Lab Results  Component Value Date   CREATININE 1.12 12/03/2020   BUN 18 12/03/2020   NA 142 12/03/2020   K 4.5 12/03/2020   CL 103 12/03/2020   CO2 32 12/03/2020   Lab Results  Component Value Date   ALT 11 12/03/2020   AST 14 12/03/2020   ALKPHOS 40 12/03/2020   BILITOT 0.5 12/03/2020   Lab Results  Component Value Date   CHOL 212 (H) 12/03/2020   Lab Results  Component Value Date   HDL 55.40 12/03/2020   Lab Results  Component Value Date   LDLCALC 125 (H) 12/03/2020  Lab Results  Component Value Date   TRIG 156.0 (H) 12/03/2020   Lab Results  Component Value Date   CHOLHDL 4 12/03/2020   Lab Results  Component Value Date   PSA 1.00  02/20/2020   Lab Results  Component Value Date   HGBA1C 7.0 (H) 12/03/2020   IMPRESSION AND PLAN:  #1: Hypertension, good control now.  You amlodipine 5 mg/day Imdur one half of the 30 mg tab per day lisinopril 40 mg/day and Toprol XL 100 mg/day. Electrolytes and kidney function were stable 2 weeks ago.  #2: Generalized anxiety disorder, with superimposed adjustment disorder with anxious mood.  We will increase Lexapro to 20 mg a day to see if this helps. Therapeutic expectations and side effect profile of medication discussed today.  Patient's questions answered.  An After Visit Summary was printed and given to the patient.  FOLLOW UP: Return in about 4 weeks (around 01/14/2021) for f/u anxiety. Cpe 02/2021  Signed:  Crissie Sickles, MD           12/17/2020

## 2020-12-24 DIAGNOSIS — E119 Type 2 diabetes mellitus without complications: Secondary | ICD-10-CM | POA: Diagnosis not present

## 2020-12-24 LAB — HM DIABETES EYE EXAM

## 2020-12-30 ENCOUNTER — Other Ambulatory Visit: Payer: Self-pay | Admitting: Family Medicine

## 2021-01-10 ENCOUNTER — Other Ambulatory Visit: Payer: Self-pay | Admitting: Family Medicine

## 2021-01-14 ENCOUNTER — Ambulatory Visit: Payer: HMO | Admitting: Family Medicine

## 2021-01-21 ENCOUNTER — Encounter: Payer: Self-pay | Admitting: Family Medicine

## 2021-01-21 ENCOUNTER — Other Ambulatory Visit: Payer: Self-pay

## 2021-01-21 ENCOUNTER — Ambulatory Visit (INDEPENDENT_AMBULATORY_CARE_PROVIDER_SITE_OTHER): Payer: HMO | Admitting: Family Medicine

## 2021-01-21 VITALS — BP 148/75 | HR 80 | Temp 98.4°F | Ht 67.0 in | Wt 254.6 lb

## 2021-01-21 DIAGNOSIS — F411 Generalized anxiety disorder: Secondary | ICD-10-CM

## 2021-01-21 DIAGNOSIS — I2510 Atherosclerotic heart disease of native coronary artery without angina pectoris: Secondary | ICD-10-CM

## 2021-01-21 MED ORDER — ESCITALOPRAM OXALATE 20 MG PO TABS: 20.0000 mg | ORAL_TABLET | Freq: Every day | ORAL | 3 refills | Status: DC

## 2021-01-21 NOTE — Patient Instructions (Signed)
Make sure you have atorvastatin 80 mg tabs.  You should be taking this once a day. Call and let me know.

## 2021-01-21 NOTE — Progress Notes (Signed)
OFFICE VISIT  01/21/2021  CC:  Chief Complaint  Patient presents with   Follow-up    anxiety   HPI:    Patient is a 75 y.o. male who presents for 1 mo f/u GAD and HLD. A/P as of last visit: "#1: Hypertension, good control now.  Cont amlodipine 5 mg/day Imdur one half of the 30 mg tab per day lisinopril 40 mg/day and Toprol XL 100 mg/day. Electrolytes and kidney function were stable 2 weeks ago.   #2: Generalized anxiety disorder, with superimposed adjustment disorder with anxious mood.  We will increase Lexapro to 20 mg a day to see if this helps."  INTERIM HX: Richard [name] feels like things are overall pretty steady lately.  No significant depressed mood.  Admits he still has some ups and downs regarding irritability.  He cannot tell whether or not the medicine increase has changed much.  No adverse side effects. We will dealing with the stress of his wife having parkinsonism.  Some other family problems persist.  No home blood pressure monitoring.  HLD: question of compliance with statin based on pharmacy rx fill hx. Patient not sure of what he takes exactly.  Says his daughter lays out his pills for him and he just takes them.  He does not recall any problems in the past with taking statins.  Past Medical History:  Diagnosis Date   Coronary artery disease    a. DES to LAD 04/2011 - took Effient/ASA x 1 yr, now on ASA only. b. Cath 10/2012: patent stent, otherwise normal cors. c. 05/2018 Prox LAD stent 75% restenosed, DES placed.  08/30/19 cath: patent stent, mild distal LAD dz not requiring intervention, LVEF nl, EDP normal: maximize antianginal meds.   COVID 04/04/2020   asymptomatic   Diabetes mellitus with complication ([place]) 15/1761   A1c 6.9%   GERD (gastroesophageal reflux disease)    HAS HAD ESOPHAGUS STRETCHED SEVERAL TIMES IN THE PAST   History of bladder cancer 04/2020   TURBT 04/2020.  Surveillance cysto clear 07/2020 and 10/2020   Hyperlipidemia    goal LDL < 70   Hypertension     Myocardial infarction ([place]) 04/21/2016   OSA on CPAP 10/18/2011   doesn't wear it   Poor historian    talk to dtr pam hooker cell [phone]   Right knee pain    RIGHT KNEE MEDIAL MENSICAL TEAR    Past Surgical History:  Procedure Laterality Date   CARDIAC CATHETERIZATION  06/2017   In-stent restenosis cleared out   CARDIOVASCULAR STRESS TEST     Latter part of 2019--normal per cardiologist's note from 10/2017   [place] and 1993   X 2   SURGERIES    SLIGHT LIMITATION ROM   COLONOSCOPY  11/24/2004; 2018   NORMAL.  Recall 2016.  Repeat 2018->tubular adenoma x 1 (Dr. Cindee Salt, Dig hea spec).   CORONARY ANGIOPLASTY WITH STENT PLACEMENT     CORONARY STENT INTERVENTION N/A 05/22/2018   Prox LAD for in stent restenosis.  DAPT x 1 yr.  Procedure: CORONARY STENT INTERVENTION;  Surgeon: Jettie Booze, MD;  Location: [facility];  Service: Cardiovascular;  Laterality: N/A;   CYSTOSCOPY W/ RETROGRADES Bilateral 04/22/2020   Procedure: CYSTOSCOPY WITH RETROGRADE PYELOGRAM;  Surgeon: Remi Haggard, MD;  Location: [facility];  Service: Urology;  Laterality: Bilateral;  30 MINS   ESOPHAGOGASTRODUODENOSCOPY  05/2016   mild reactive changes   INTRAVASCULAR ULTRASOUND/IVUS N/A 05/22/2018  Procedure: Intravascular Ultrasound/IVUS;  Surgeon: Jettie Booze, MD;  Location: [place] [facility];  Service: Cardiovascular;  Laterality: N/A;   KNEE ARTHROSCOPY  10/22/2011   Procedure: ARTHROSCOPY KNEE;  Surgeon: Gearlean Alf, MD;  Location: [facility];  Service: Orthopedics;  Laterality: Right;  Right knee scope with debridement   LAPAROSCOPIC CHOLECYSTECTOMY  2018   LEFT HEART CATH AND CORONARY ANGIOGRAPHY N/A 05/22/2018   DES placed for in stent restenosis.  EF 55-60%.  Procedure: LEFT HEART CATH AND CORONARY ANGIOGRAPHY;  Surgeon: Jettie Booze, MD;  Location: [place] [facility];  Service: Cardiovascular;  Laterality: N/A;   LEFT HEART CATH AND  CORONARY ANGIOGRAPHY N/A 08/30/2019   No change from 2020 cath->patent LAD stent, mild distal LAD dz.  No intervention required. Nitrates + possible future CCB antianginal for poss microvasc dz. Procedure: LEFT HEART CATH AND CORONARY ANGIOGRAPHY;  Surgeon: Jettie Booze, MD;  Location: [place] [facility];  Service: Cardiovascular;  Laterality: N/A;   LEFT HEART CATHETERIZATION WITH CORONARY ANGIOGRAM N/A 10/08/2012   Procedure: LEFT HEART CATHETERIZATION WITH CORONARY ANGIOGRAM;  Surgeon: Lorretta Harp, MD;  Location: [facility];  Service: Cardiovascular;  Laterality: N/A;   NASAL SINUS SURGERY  yrs ago   ONE SINUS SURGERY THRU NOSE AND ANOTHER SINUS SURGERY THRU INCISION ABOVE RT EYE   TRANSTHORACIC ECHOCARDIOGRAM  05/21/2018   EF 60-65%, grd I DD, no valvular probs.   TRANSTHORACIC ECHOCARDIOGRAM  05/21/2018   EF 60-65%, normal wall motion, +DD, normal valves and RV fxn.   TRANSURETHRAL RESECTION OF BLADDER TUMOR N/A 04/22/2020   PATH: NONinvasive high grade uroepithelial malignancy. Procedure: TRANSURETHRAL RESECTION OF BLADDER TUMOR (TURBT); FULGERATION;  Surgeon: Remi Haggard, MD;  Location: [facility];  Service: Urology;  Laterality: N/A;    Outpatient Medications Prior to Visit  Medication Sig Dispense Refill   acetaminophen (TYLENOL) 500 MG tablet Take 1,500 mg by mouth every 6 (six) hours as needed for mild pain or moderate pain (Sinus).     amLODipine (NORVASC) 5 MG tablet Take 1 tablet (5 mg total) by mouth daily. 90 tablet 3   Apoaequorin (PREVAGEN) 10 MG CAPS Take 10 mg by mouth daily.     aspirin EC 81 MG EC tablet Take 1 tablet (81 mg total) by mouth daily.     atorvastatin (LIPITOR) 80 MG tablet TAKE 1 TABLET (80 MG TOTAL) BY MOUTH DAILY AT 6 PM. 90 tablet 1   cholecalciferol (VITAMIN D) 1000 UNITS tablet Take 2,000 Units by mouth every evening.     clopidogrel (PLAVIX) 75 MG tablet Take 1 tablet (75 mg total) by mouth daily with breakfast. 90  tablet 3   ezetimibe (ZETIA) 10 MG tablet Take 1 tablet (10 mg total) by mouth daily. 90 tablet 3   fenofibrate (TRICOR) 145 MG tablet Take 1 tablet (145 mg total) by mouth daily. 90 tablet 3   furosemide (LASIX) 40 MG tablet Take 1 tablet (40 mg total) by mouth daily. Take every  day except: Sunday 90 tablet 3   gabapentin (NEURONTIN) 300 MG capsule TAKE 1 CAPSULE BY MOUTH TWICE A DAY 180 capsule 1   isosorbide mononitrate (IMDUR) 30 MG 24 hr tablet TAKE 1/2 TABLET (15 MG TOTAL) BY MOUTH DAILY. 45 tablet 0   lisinopril (ZESTRIL) 40 MG tablet TAKE 1 TABLET (40 MG TOTAL) BY MOUTH DAILY. 90 tablet 3   metFORMIN (GLUCOPHAGE) 500 MG tablet Take 1 tablet (500 mg total) by mouth 2 (  two) times daily with a meal.     metoprolol succinate (TOPROL-XL) 100 MG 24 hr tablet TAKE 1 TABLET BY MOUTH IN THE MORNING AND AT BEDTIME. TAKE WITH OR IMMEDIATELY FOLLOWING A MEAL. 180 tablet 1   pantoprazole (PROTONIX) 40 MG tablet Take 40 mg by mouth every evening.     phenylephrine (SINUS RELIEF EXTRA STRENGTH) 1 % nasal spray Place 1 drop into both nostrils every 6 (six) hours as needed for congestion.     vitamin B-12 (CYANOCOBALAMIN) 1000 MCG tablet Take 1,000 mcg by mouth every other day.     escitalopram (LEXAPRO) 20 MG tablet Take 1 tablet (20 mg total) by mouth daily. 30 tablet 1   dicyclomine (BENTYL) 10 MG capsule TAKE 1 CAPSULE (10 MG TOTAL) BY MOUTH 4 (FOUR) TIMES DAILY - BEFORE MEALS AND AT BEDTIME. (Patient not taking: No sig reported) 360 capsule 1   nitroGLYCERIN (NITROSTAT) 0.4 MG SL tablet PLACE 1 TABLET UNDER THE TONGUE EVERY 5 MINUTES X 3 DOSES AS NEEDED FOR CHEST PAIN. (Patient not taking: No sig reported) 25 tablet 6   No facility-administered medications prior to visit.    No Known Allergies  ROS As per HPI  PE: Vitals with BMI 01/21/2021 12/17/2020 12/08/2020  Height 5\' 7"  5\' 7"  5\' 7"   Weight 254 lbs 10 oz 252 lbs 10 oz 250 lbs  BMI 39.87 07.37 10.62  Systolic 694 854 627  Diastolic 75  74 70  Pulse 80 65 67   Gen: Alert, well appearing.  Patient is oriented to person, place, time, and situation. AFFECT: pleasant, lucid thought and speech. CV: RRR, no m/r/g.   LUNGS: CTA bilat, nonlabored resps, good aeration in all lung fields. EXT: no clubbing or cyanosis.  no edema.    LABS:  Lab Results  Component Value Date   TSH 1.54 08/24/2019   Lab Results  Component Value Date   WBC 6.8 02/20/2020   HGB 14.3 04/22/2020   HCT 42.0 04/22/2020   MCV 95.4 02/20/2020   PLT 184.0 02/20/2020   Lab Results  Component Value Date   CREATININE 1.12 12/03/2020   BUN 18 12/03/2020   NA 142 12/03/2020   K 4.5 12/03/2020   CL 103 12/03/2020   CO2 32 12/03/2020   Lab Results  Component Value Date   ALT 11 12/03/2020   AST 14 12/03/2020   ALKPHOS 40 12/03/2020   BILITOT 0.5 12/03/2020   Lab Results  Component Value Date   CHOL 212 (H) 12/03/2020   Lab Results  Component Value Date   HDL 55.40 12/03/2020   Lab Results  Component Value Date   LDLCALC 125 (H) 12/03/2020   Lab Results  Component Value Date   TRIG 156.0 (H) 12/03/2020   Lab Results  Component Value Date   CHOLHDL 4 12/03/2020   Lab Results  Component Value Date   PSA 1.00 02/20/2020   Lab Results  Component Value Date   HGBA1C 7.0 (H) 12/03/2020    IMPRESSION AND PLAN:  #1 GAD.  Stable.  Questionable improvement over the last month with increased dose of Lexapro.  Decided to continue at current dose and give this longer.  #2 coronary artery disease.  Not clear whether he is taking his statin or not.  Denies past problems with statins.  I asked him to check with his daughter about whether or not atorvastatin is part of his pill pack.  I wrote this on his AVS as well. Asked him to  call and let me know.  No labs today. Next a1c after 03/04/21  An After Visit Summary was printed and given to the patient.  FOLLOW UP: Return for 6-7 wks f/u RCI.  Signed:  Crissie Sickles, MD            01/21/2021

## 2021-01-22 ENCOUNTER — Telehealth: Payer: Self-pay | Admitting: Family Medicine

## 2021-01-22 ENCOUNTER — Other Ambulatory Visit: Payer: Self-pay

## 2021-01-22 MED ORDER — ATORVASTATIN CALCIUM 80 MG PO TABS: 80.0000 mg | ORAL_TABLET | Freq: Every day | ORAL | 3 refills | Status: DC

## 2021-01-22 NOTE — Telephone Encounter (Signed)
Pt's daughter, Jeannene Patella advised refills sent.

## 2021-01-22 NOTE — Telephone Encounter (Signed)
Ok, rx sent in

## 2021-01-22 NOTE — Telephone Encounter (Signed)
Please review and advise. Medication pending 

## 2021-01-22 NOTE — Telephone Encounter (Signed)
Jone Baseman called stating pt needing refill on atorvastatin (LIPITOR) 80 MG tablet. Pt has 1 day. She said Dr. Anitra Lauth asked for call back to notify it pt was taking and he is.   Please send RX to   [id number] - [place], [place] Phone:  [phone]  Fax:  [phone]

## 2021-01-27 DIAGNOSIS — N5201 Erectile dysfunction due to arterial insufficiency: Secondary | ICD-10-CM | POA: Diagnosis not present

## 2021-01-27 DIAGNOSIS — C678 Malignant neoplasm of overlapping sites of bladder: Secondary | ICD-10-CM | POA: Diagnosis not present

## 2021-03-11 ENCOUNTER — Other Ambulatory Visit: Payer: Self-pay

## 2021-03-11 ENCOUNTER — Encounter: Payer: Self-pay | Admitting: Family Medicine

## 2021-03-11 ENCOUNTER — Ambulatory Visit (INDEPENDENT_AMBULATORY_CARE_PROVIDER_SITE_OTHER): Payer: HMO | Admitting: Family Medicine

## 2021-03-11 VITALS — BP 121/76 | HR 72 | Temp 98.3°F | Ht 67.0 in | Wt 253.2 lb

## 2021-03-11 DIAGNOSIS — I10 Essential (primary) hypertension: Secondary | ICD-10-CM | POA: Diagnosis not present

## 2021-03-11 DIAGNOSIS — E119 Type 2 diabetes mellitus without complications: Secondary | ICD-10-CM

## 2021-03-11 DIAGNOSIS — E782 Mixed hyperlipidemia: Secondary | ICD-10-CM

## 2021-03-11 DIAGNOSIS — F411 Generalized anxiety disorder: Secondary | ICD-10-CM | POA: Diagnosis not present

## 2021-03-11 DIAGNOSIS — I2510 Atherosclerotic heart disease of native coronary artery without angina pectoris: Secondary | ICD-10-CM | POA: Diagnosis not present

## 2021-03-11 LAB — BASIC METABOLIC PANEL
BUN: 23 mg/dL (ref 6–23)
CO2: 31 mEq/L (ref 19–32)
Calcium: 9.6 mg/dL (ref 8.4–10.5)
Chloride: 101 mEq/L (ref 96–112)
Creatinine, Ser: 1.54 mg/dL — ABNORMAL HIGH (ref 0.40–1.50)
GFR: 44.28 mL/min — ABNORMAL LOW (ref >60.00)
Glucose, Bld: 119 mg/dL — ABNORMAL HIGH (ref 70–99)
Potassium: 4.3 mEq/L (ref 3.5–5.1)
Sodium: 142 mEq/L (ref 135–145)

## 2021-03-11 LAB — LIPID PANEL
Cholesterol: 180 mg/dL (ref 0–200)
HDL: 45.6 mg/dL (ref >39.00)
NonHDL: 134.51
Total CHOL/HDL Ratio: 4
Triglycerides: 206 mg/dL — ABNORMAL HIGH (ref 0.0–149.0)
VLDL: 41.2 mg/dL — ABNORMAL HIGH (ref 0.0–40.0)

## 2021-03-11 LAB — HEMOGLOBIN A1C: Hgb A1c MFr Bld: 7.5 % — ABNORMAL HIGH (ref 4.6–6.5)

## 2021-03-11 LAB — LDL CHOLESTEROL, DIRECT: Direct LDL: 115 mg/dL

## 2021-03-11 NOTE — Progress Notes (Signed)
OFFICE VISIT  03/11/2021  CC:  Chief Complaint  Patient presents with   Follow-up    RCI; pt is fasting   HPI:    Patient is a 76 y.o. male who presents for 3 mo f/u DM, HTN, HLD, GAD. I last saw him about 6 wks ago. A/P as of that visit: "#1 GAD.  Stable.  Questionable improvement over the last month with increased dose of Lexapro.  Decided to continue at current dose and give this longer.   #2 coronary artery disease.  Not clear whether he is taking his statin or not.  Denies past problems with statins.  I asked him to check with his daughter about whether or not atorvastatin is part of his pill pack.  I wrote this on his AVS as well. Asked him to call and let me know.   No labs today. Next a1c after 03/04/21"  INTERIM HX: Richard [name] feels well. Still struggling with some tough life circumstances, feels like he is probably maxed out on benefit from Lexapro.  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  No persistent depressed mood, no panic attacks.  His main symptom is some irritability and feeling keyed up.  Clarification from last visit:  Pt HAS been taking his atorvastatin.  No home glucose monitoring. No feet symptoms.  No home bp monitoring.  ROS as above, plus--> no fevers, no CP, no SOB, no wheezing, no cough, no dizziness, no HAs, no rashes, no melena/hematochezia.  No polyuria or polydipsia.  No myalgias or arthralgias.  No focal weakness, paresthesias, or tremors.  No acute vision or hearing abnormalities.  No dysuria or unusual/new urinary urgency or frequency.  No recent changes in lower legs. No n/v/d or abd pain.  No palpitations.     Past Medical History:  Diagnosis Date   Coronary artery disease    a. DES to LAD 04/2011 - took Effient/ASA x 1 yr, now on ASA only. b. Cath 10/2012: patent stent, otherwise normal cors. c. 05/2018 Prox LAD stent 75% restenosed, DES placed.  08/30/19 cath: patent stent, mild  distal LAD dz not requiring intervention, LVEF nl, EDP normal: maximize antianginal meds.   COVID 04/04/2020   asymptomatic   Diabetes mellitus with complication ([place]) 83/3825   A1c 6.9%   GERD (gastroesophageal reflux disease)    HAS HAD ESOPHAGUS STRETCHED SEVERAL TIMES IN THE PAST   History of bladder cancer 04/2020   TURBT 04/2020.  Surveillance cysto clear 07/2020 and 10/2020   Hyperlipidemia    goal LDL < 70   Hypertension    Myocardial infarction ([place]) 04/21/2016   OSA on CPAP 10/18/2011   doesn't wear it   Poor historian    talk to dtr pam hooker cell [phone]   Right knee pain    RIGHT KNEE MEDIAL MENSICAL TEAR    Past Surgical History:  Procedure Laterality Date   CARDIAC CATHETERIZATION  06/2017   In-stent restenosis cleared out   CARDIOVASCULAR STRESS TEST     Latter part of 2019--normal per cardiologist's note from 10/2017   [place] and 1993   X 2   SURGERIES    SLIGHT LIMITATION ROM   COLONOSCOPY  11/24/2004; 2018   NORMAL.  Recall 2016.  Repeat 2018->tubular adenoma x 1 (Dr. Cindee Salt, Dig hea spec).   CORONARY ANGIOPLASTY WITH STENT PLACEMENT     CORONARY STENT INTERVENTION N/A  05/22/2018   Prox LAD for in stent restenosis.  DAPT x 1 yr.  Procedure: CORONARY STENT INTERVENTION;  Surgeon: Jettie Booze, MD;  Location: [place] [facility];  Service: Cardiovascular;  Laterality: N/A;   CYSTOSCOPY W/ RETROGRADES Bilateral 04/22/2020   Procedure: CYSTOSCOPY WITH RETROGRADE PYELOGRAM;  Surgeon: Remi Haggard, MD;  Location: [facility];  Service: Urology;  Laterality: Bilateral;  30 MINS   ESOPHAGOGASTRODUODENOSCOPY  05/2016   mild reactive changes   INTRAVASCULAR ULTRASOUND/IVUS N/A 05/22/2018   Procedure: Intravascular Ultrasound/IVUS;  Surgeon: Jettie Booze, MD;  Location: [place] [facility];  Service: Cardiovascular;  Laterality: N/A;   KNEE ARTHROSCOPY  10/22/2011   Procedure: ARTHROSCOPY KNEE;  Surgeon: Gearlean Alf,  MD;  Location: [facility];  Service: Orthopedics;  Laterality: Right;  Right knee scope with debridement   LAPAROSCOPIC CHOLECYSTECTOMY  2018   LEFT HEART CATH AND CORONARY ANGIOGRAPHY N/A 05/22/2018   DES placed for in stent restenosis.  EF 55-60%.  Procedure: LEFT HEART CATH AND CORONARY ANGIOGRAPHY;  Surgeon: Jettie Booze, MD;  Location: [place] [facility];  Service: Cardiovascular;  Laterality: N/A;   LEFT HEART CATH AND CORONARY ANGIOGRAPHY N/A 08/30/2019   No change from 2020 cath->patent LAD stent, mild distal LAD dz.  No intervention required. Nitrates + possible future CCB antianginal for poss microvasc dz. Procedure: LEFT HEART CATH AND CORONARY ANGIOGRAPHY;  Surgeon: Jettie Booze, MD;  Location: [place] [facility];  Service: Cardiovascular;  Laterality: N/A;   LEFT HEART CATHETERIZATION WITH CORONARY ANGIOGRAM N/A 10/08/2012   Procedure: LEFT HEART CATHETERIZATION WITH CORONARY ANGIOGRAM;  Surgeon: Lorretta Harp, MD;  Location: [facility];  Service: Cardiovascular;  Laterality: N/A;   NASAL SINUS SURGERY  yrs ago   ONE SINUS SURGERY THRU NOSE AND ANOTHER SINUS SURGERY THRU INCISION ABOVE RT EYE   TRANSTHORACIC ECHOCARDIOGRAM  05/21/2018   EF 60-65%, grd I DD, no valvular probs.   TRANSTHORACIC ECHOCARDIOGRAM  05/21/2018   EF 60-65%, normal wall motion, +DD, normal valves and RV fxn.   TRANSURETHRAL RESECTION OF BLADDER TUMOR N/A 04/22/2020   PATH: NONinvasive high grade uroepithelial malignancy. Procedure: TRANSURETHRAL RESECTION OF BLADDER TUMOR (TURBT); FULGERATION;  Surgeon: Remi Haggard, MD;  Location: [facility];  Service: Urology;  Laterality: N/A;    Outpatient Medications Prior to Visit  Medication Sig Dispense Refill   acetaminophen (TYLENOL) 500 MG tablet Take 1,500 mg by mouth every 6 (six) hours as needed for mild pain or moderate pain (Sinus).     amLODipine (NORVASC) 5 MG tablet Take 1 tablet (5 mg total) by mouth daily. 90 tablet 3    Apoaequorin (PREVAGEN) 10 MG CAPS Take 10 mg by mouth daily.     aspirin EC 81 MG EC tablet Take 1 tablet (81 mg total) by mouth daily.     atorvastatin (LIPITOR) 80 MG tablet Take 1 tablet (80 mg total) by mouth daily at 6 PM. 90 tablet 3   cholecalciferol (VITAMIN D) 1000 UNITS tablet Take 2,000 Units by mouth every evening.     clopidogrel (PLAVIX) 75 MG tablet Take 1 tablet (75 mg total) by mouth daily with breakfast. 90 tablet 3   escitalopram (LEXAPRO) 20 MG tablet Take 1 tablet (20 mg total) by mouth daily. 90 tablet 3   ezetimibe (ZETIA) 10 MG tablet Take 1 tablet (10 mg total) by mouth daily. 90 tablet 3   fenofibrate (TRICOR) 145 MG tablet Take 1 tablet (  145 mg total) by mouth daily. 90 tablet 3   furosemide (LASIX) 40 MG tablet Take 1 tablet (40 mg total) by mouth daily. Take every  day except: Sunday 90 tablet 3   gabapentin (NEURONTIN) 300 MG capsule TAKE 1 CAPSULE BY MOUTH TWICE A DAY 180 capsule 1   isosorbide mononitrate (IMDUR) 30 MG 24 hr tablet TAKE 1/2 TABLET (15 MG TOTAL) BY MOUTH DAILY. 45 tablet 0   lisinopril (ZESTRIL) 40 MG tablet TAKE 1 TABLET (40 MG TOTAL) BY MOUTH DAILY. 90 tablet 3   metFORMIN (GLUCOPHAGE) 500 MG tablet Take 1 tablet (500 mg total) by mouth 2 (two) times daily with a meal.     metoprolol succinate (TOPROL-XL) 100 MG 24 hr tablet TAKE 1 TABLET BY MOUTH IN THE MORNING AND AT BEDTIME. TAKE WITH OR IMMEDIATELY FOLLOWING A MEAL. 180 tablet 1   pantoprazole (PROTONIX) 40 MG tablet Take 40 mg by mouth every evening.     phenylephrine (SINUS RELIEF EXTRA STRENGTH) 1 % nasal spray Place 1 drop into both nostrils every 6 (six) hours as needed for congestion.     vitamin B-12 (CYANOCOBALAMIN) 1000 MCG tablet Take 1,000 mcg by mouth every other day.     dicyclomine (BENTYL) 10 MG capsule TAKE 1 CAPSULE (10 MG TOTAL) BY MOUTH 4 (FOUR) TIMES DAILY - BEFORE MEALS AND AT BEDTIME. (Patient not taking: Reported on 05/21/2020) 360 capsule 1   nitroGLYCERIN (NITROSTAT)  0.4 MG SL tablet PLACE 1 TABLET UNDER THE TONGUE EVERY 5 MINUTES X 3 DOSES AS NEEDED FOR CHEST PAIN. (Patient not taking: Reported on 12/17/2020) 25 tablet 6   No facility-administered medications prior to visit.    No Known Allergies  ROS As per HPI  PE: Vitals with BMI 03/11/2021 01/21/2021 12/17/2020  Height 5\' 7"  5\' 7"  5\' 7"   Weight 253 lbs 3 oz 254 lbs 10 oz 252 lbs 10 oz  BMI 39.65 33.82 50.53  Systolic 976 734 193  Diastolic 76 75 74  Pulse 72 80 65     Physical Exam  Gen: Alert, well appearing.  Patient is oriented to person, place, time, and situation. AFFECT: pleasant, lucid thought and speech. Foot exam -  no swelling, tenderness or skin or vascular lesions. Color and temperature is normal. Sensation is intact. Peripheral pulses are palpable. Toenails are normal.   LABS:  Last CBC Lab Results  Component Value Date   WBC 6.8 02/20/2020   HGB 14.3 04/22/2020   HCT 42.0 04/22/2020   MCV 95.4 02/20/2020   MCH 31.3 05/22/2018   RDW 13.4 02/20/2020   PLT 184.0 79/04/4095   Last metabolic panel Lab Results  Component Value Date   GLUCOSE 115 (H) 12/03/2020   NA 142 12/03/2020   K 4.5 12/03/2020   CL 103 12/03/2020   CO2 32 12/03/2020   BUN 18 12/03/2020   CREATININE 1.12 12/03/2020   GFRNONAA 74 08/18/2018   CALCIUM 9.6 12/03/2020   PROT 6.4 12/03/2020   ALBUMIN 4.2 12/03/2020   BILITOT 0.5 12/03/2020   ALKPHOS 40 12/03/2020   AST 14 12/03/2020   ALT 11 12/03/2020   ANIONGAP 8 05/22/2018   Last lipids Lab Results  Component Value Date   CHOL 212 (H) 12/03/2020   HDL 55.40 12/03/2020   LDLCALC 125 (H) 12/03/2020   LDLDIRECT 145.0 02/20/2020   TRIG 156.0 (H) 12/03/2020   CHOLHDL 4 12/03/2020   Last hemoglobin A1c Lab Results  Component Value Date   HGBA1C 7.0 (H) 12/03/2020  Last thyroid functions Lab Results  Component Value Date   TSH 1.54 08/24/2019   IMPRESSION AND PLAN:  #1 type 2 diabetes, no complications. No home glucose  monitoring. Hemoglobin A1c and fasting glucose today. Feet exam normal today.  #2 hypertension.  Well-controlled on amlodipine 5 mg a day, lisinopril 40 mg a day, Toprol-XL 100 mg/day, and Imdur 30 mg/day. Electrolytes and creatinine today.  3. HLD, mixed:  last LDL 3 mo ago 125. Pt on atorva 80 qd, fenofibrate, and zetia. If LDL not <70 today then will refer to lipid clinic to see if he's a candidate for PCSK9-I.  #4 GAD, mainly manifesting as irritability.  Continue Lexapro 20 mg a day as I do think this has helped some.  Several social circumstances present and he is trying to cope with these best he can.  I do not think any change in medication will change this, and he agrees. An After Visit Summary was printed and given to the patient.  FOLLOW UP: Return in about 3 months (around 06/09/2021) for annual CPE (fasting) +RCI. Cpe 3 mo  Signed:  Crissie Sickles, MD           03/11/2021

## 2021-03-27 ENCOUNTER — Other Ambulatory Visit: Payer: Self-pay

## 2021-03-27 ENCOUNTER — Ambulatory Visit (INDEPENDENT_AMBULATORY_CARE_PROVIDER_SITE_OTHER): Payer: HMO

## 2021-03-27 DIAGNOSIS — I10 Essential (primary) hypertension: Secondary | ICD-10-CM

## 2021-03-27 DIAGNOSIS — E119 Type 2 diabetes mellitus without complications: Secondary | ICD-10-CM

## 2021-03-27 LAB — BASIC METABOLIC PANEL
BUN: 17 mg/dL (ref 6–23)
CO2: 32 mEq/L (ref 19–32)
Calcium: 9.4 mg/dL (ref 8.4–10.5)
Chloride: 104 mEq/L (ref 96–112)
Creatinine, Ser: 1.29 mg/dL (ref 0.40–1.50)
GFR: 54.76 mL/min — ABNORMAL LOW (ref >60.00)
Glucose, Bld: 136 mg/dL — ABNORMAL HIGH (ref 70–99)
Potassium: 4.4 mEq/L (ref 3.5–5.1)
Sodium: 142 mEq/L (ref 135–145)

## 2021-03-27 NOTE — Progress Notes (Signed)
Per the orders of Dr. Anitra Lauth pt is here for labs-- repeat BMET, pt tolerated draw well.

## 2021-03-31 ENCOUNTER — Encounter: Payer: Self-pay | Admitting: Family Medicine

## 2021-04-07 ENCOUNTER — Other Ambulatory Visit: Payer: Self-pay | Admitting: Family Medicine

## 2021-05-04 DIAGNOSIS — N5201 Erectile dysfunction due to arterial insufficiency: Secondary | ICD-10-CM | POA: Diagnosis not present

## 2021-05-04 DIAGNOSIS — C678 Malignant neoplasm of overlapping sites of bladder: Secondary | ICD-10-CM | POA: Diagnosis not present

## 2021-05-15 NOTE — Telephone Encounter (Signed)
LVM for pt to call back in regards to scheduling AWV with our health coach.  ? ?05/15/2021 ? ?

## 2021-06-01 ENCOUNTER — Telehealth: Payer: Self-pay

## 2021-06-01 NOTE — Telephone Encounter (Signed)
Called pt to schedule AWV. Please schedule with health coach, Toria or Otho Michalik. ? ?

## 2021-06-10 ENCOUNTER — Encounter: Payer: HMO | Admitting: Family Medicine

## 2021-07-01 ENCOUNTER — Encounter: Payer: Self-pay | Admitting: Family Medicine

## 2021-07-01 ENCOUNTER — Ambulatory Visit (INDEPENDENT_AMBULATORY_CARE_PROVIDER_SITE_OTHER): Payer: HMO | Admitting: Family Medicine

## 2021-07-01 VITALS — BP 124/72 | HR 63 | Temp 98.4°F | Ht 67.0 in | Wt 244.0 lb

## 2021-07-01 DIAGNOSIS — I2510 Atherosclerotic heart disease of native coronary artery without angina pectoris: Secondary | ICD-10-CM

## 2021-07-01 DIAGNOSIS — Z0000 Encounter for general adult medical examination without abnormal findings: Secondary | ICD-10-CM

## 2021-07-01 DIAGNOSIS — E119 Type 2 diabetes mellitus without complications: Secondary | ICD-10-CM

## 2021-07-01 DIAGNOSIS — E782 Mixed hyperlipidemia: Secondary | ICD-10-CM

## 2021-07-01 DIAGNOSIS — I10 Essential (primary) hypertension: Secondary | ICD-10-CM

## 2021-07-01 DIAGNOSIS — N1831 Chronic kidney disease, stage 3a: Secondary | ICD-10-CM

## 2021-07-01 LAB — COMPREHENSIVE METABOLIC PANEL
ALT: 10 U/L (ref 0–53)
AST: 15 U/L (ref 0–37)
Albumin: 4.3 g/dL (ref 3.5–5.2)
Alkaline Phosphatase: 40 U/L (ref 39–117)
BUN: 19 mg/dL (ref 6–23)
CO2: 33 mEq/L — ABNORMAL HIGH (ref 19–32)
Calcium: 9.3 mg/dL (ref 8.4–10.5)
Chloride: 103 mEq/L (ref 96–112)
Creatinine, Ser: 1.35 mg/dL (ref 0.40–1.50)
GFR: 51.75 mL/min — ABNORMAL LOW (ref >60.00)
Glucose, Bld: 119 mg/dL — ABNORMAL HIGH (ref 70–99)
Potassium: 4.2 mEq/L (ref 3.5–5.1)
Sodium: 143 mEq/L (ref 135–145)
Total Bilirubin: 0.7 mg/dL (ref 0.2–1.2)
Total Protein: 6.2 g/dL (ref 6.0–8.3)

## 2021-07-01 LAB — CBC
HCT: 41.6 % (ref 39.0–52.0)
Hemoglobin: 14 g/dL (ref 13.0–17.0)
MCHC: 33.6 g/dL (ref 30.0–36.0)
MCV: 97 fl (ref 78.0–100.0)
Platelets: 189 10*3/uL (ref 150.0–400.0)
RBC: 4.29 Mil/uL (ref 4.22–5.81)
RDW: 13.4 % (ref 11.5–15.5)
WBC: 8.7 10*3/uL (ref 4.0–10.5)

## 2021-07-01 LAB — MICROALBUMIN / CREATININE URINE RATIO
Creatinine,U: 83.5 mg/dL
Microalb Creat Ratio: 0.8 mg/g (ref 0.0–30.0)
Microalb, Ur: <0.7 mg/dL (ref 0.0–1.9)

## 2021-07-01 LAB — HEMOGLOBIN A1C: Hgb A1c MFr Bld: 7.2 % — ABNORMAL HIGH (ref 4.6–6.5)

## 2021-07-01 MED ORDER — GABAPENTIN 300 MG PO CAPS: 300.0000 mg | ORAL_CAPSULE | Freq: Two times a day (BID) | ORAL | 1 refills | Status: DC

## 2021-07-01 MED ORDER — METOPROLOL SUCCINATE ER 100 MG PO TB24: ORAL_TABLET | ORAL | 1 refills | Status: DC

## 2021-07-01 MED ORDER — LISINOPRIL 40 MG PO TABS: ORAL_TABLET | ORAL | 3 refills | Status: DC

## 2021-07-01 NOTE — Patient Instructions (Signed)
Health Maintenance, Male Adopting a healthy lifestyle and getting preventive care are important in promoting health and wellness. Ask your health care provider about: The right schedule for you to have regular tests and exams. Things you can do on your own to prevent diseases and keep yourself healthy. What should I know about diet, weight, and exercise? Eat a healthy diet  Eat a diet that includes plenty of vegetables, fruits, low-fat dairy products, and lean protein. Do not eat a lot of foods that are high in solid fats, added sugars, or sodium. Maintain a healthy weight Body mass index (BMI) is a measurement that can be used to identify possible weight problems. It estimates body fat based on height and weight. Your health care provider can help determine your BMI and help you achieve or maintain a healthy weight. Get regular exercise Get regular exercise. This is one of the most important things you can do for your health. Most adults should: Exercise for at least 150 minutes each week. The exercise should increase your heart rate and make you sweat (moderate-intensity exercise). Do strengthening exercises at least twice a week. This is in addition to the moderate-intensity exercise. Spend less time sitting. Even light physical activity can be beneficial. Watch cholesterol and blood lipids Have your blood tested for lipids and cholesterol at 75 years of age, then have this test every 5 years. You may need to have your cholesterol levels checked more often if: Your lipid or cholesterol levels are high. You are older than 75 years of age. You are at high risk for heart disease. What should I know about cancer screening? Many types of cancers can be detected early and may often be prevented. Depending on your health history and family history, you may need to have cancer screening at various ages. This may include screening for: Colorectal cancer. Prostate cancer. Skin cancer. Lung  cancer. What should I know about heart disease, diabetes, and high blood pressure? Blood pressure and heart disease High blood pressure causes heart disease and increases the risk of stroke. This is more likely to develop in people who have high blood pressure readings or are overweight. Talk with your health care provider about your target blood pressure readings. Have your blood pressure checked: Every 3-5 years if you are 18-39 years of age. Every year if you are 40 years old or older. If you are between the ages of 65 and 75 and are a current or former smoker, ask your health care provider if you should have a one-time screening for abdominal aortic aneurysm (AAA). Diabetes Have regular diabetes screenings. This checks your fasting blood sugar level. Have the screening done: Once every three years after age 45 if you are at a normal weight and have a low risk for diabetes. More often and at a younger age if you are overweight or have a high risk for diabetes. What should I know about preventing infection? Hepatitis B If you have a higher risk for hepatitis B, you should be screened for this virus. Talk with your health care provider to find out if you are at risk for hepatitis B infection. Hepatitis C Blood testing is recommended for: Everyone born from 1945 through 1965. Anyone with known risk factors for hepatitis C. Sexually transmitted infections (STIs) You should be screened each year for STIs, including gonorrhea and chlamydia, if: You are sexually active and are younger than 75 years of age. You are older than 75 years of age and your   health care provider tells you that you are at risk for this type of infection. Your sexual activity has changed since you were last screened, and you are at increased risk for chlamydia or gonorrhea. Ask your health care provider if you are at risk. Ask your health care provider about whether you are at high risk for HIV. Your health care provider  may recommend a prescription medicine to help prevent HIV infection. If you choose to take medicine to prevent HIV, you should first get tested for HIV. You should then be tested every 3 months for as long as you are taking the medicine. Follow these instructions at home: Alcohol use Do not drink alcohol if your health care provider tells you not to drink. If you drink alcohol: Limit how much you have to 0-2 drinks a day. Know how much alcohol is in your drink. In the [place] one drink equals one 12 oz bottle of beer (355 mL), one 5 oz glass of wine (148 mL), or one 1 oz glass of hard liquor (44 mL). Lifestyle Do not use any products that contain nicotine or tobacco. These products include cigarettes, chewing tobacco, and vaping devices, such as e-cigarettes. If you need help quitting, ask your health care provider. Do not use street drugs. Do not share needles. Ask your health care provider for help if you need support or information about quitting drugs. General instructions Schedule regular health, dental, and eye exams. Stay current with your vaccines. Tell your health care provider if: You often feel depressed. You have ever been abused or do not feel safe at home. Summary Adopting a healthy lifestyle and getting preventive care are important in promoting health and wellness. Follow your health care provider's instructions about healthy diet, exercising, and getting tested or screened for diseases. Follow your health care provider's instructions on monitoring your cholesterol and blood pressure. This information is not intended to replace advice given to you by your health care provider. Make sure you discuss any questions you have with your health care provider. Document Revised: 07/14/2020 Document Reviewed: 07/14/2020 Elsevier Patient Education  2023 Else[employer] Inc[employer]  

## 2021-07-01 NOTE — Progress Notes (Signed)
Office Note ?07/01/2021 ? ?CC:  ?Chief Complaint  ?Patient presents with  ? Diabetes  ? Hypertension  ? Hyperlipidemia  ?  Pt is not fasting  ? ? ?Patient is a 76 y.o. male who is here for annual health maintenance exam and 4 mo f/u DM, HTN, HLD, GAD. ?A/P as of last visit: ?"#1 type 2 diabetes, no complications. ?No home glucose monitoring. ?Hemoglobin A1c and fasting glucose today. ?Feet exam normal today. ?  ?#2 hypertension.  Well-controlled on amlodipine 5 mg a day, lisinopril 40 mg a day, Toprol-XL 100 mg/day, and Imdur 30 mg/day. ?Electrolytes and creatinine today. ? ?3. HLD, mixed:  last LDL 3 mo ago 125. ?Pt on atorva 80 qd, fenofibrate, and zetia. ?If LDL not <70 today then will refer to lipid clinic to see if he's a candidate for PCSK9-I. ?  ?#4 GAD, mainly manifesting as irritability.  Continue Lexapro 20 mg a day as I do think this has helped some.  Several social circumstances present and he is trying to cope with these best he can.  I do not think any change in medication will change this, and he agrees. ?An After Visit Summary was printed and given to the patient" ? ?INTERIM HX: ?Mikki Santee feels well physically but emotionally he is grieving for the loss of his wife about 1 month ago.  She was in hospice.  He did her eulogy. ?He feels like he is grieving/adjusting appropriately. ? ?No home glucose or blood pressure monitoring. ? ? ?Past Medical History:  ?Diagnosis Date  ? Chronic renal insufficiency, stage III (moderate) (HCC)   ? Coronary artery disease   ? a. DES to LAD 04/2011 - took Effient/ASA x 1 yr, now on ASA only. b. Cath 10/2012: patent stent, otherwise normal cors. c. 05/2018 Prox LAD stent 75% restenosed, DES placed.  08/30/19 cath: patent stent, mild distal LAD dz not requiring intervention, LVEF nl, EDP normal: maximize antianginal meds.  ? COVID 04/04/2020  ? asymptomatic  ? Diabetes mellitus with complication ([place]) 08/3014  ? A1c 6.9%  ? GERD (gastroesophageal reflux disease)   ? HAS HAD  ESOPHAGUS STRETCHED SEVERAL TIMES IN THE PAST  ? History of bladder cancer 04/2020  ? TURBT 04/2020.  Surveillance cysto clear 07/2020 and 10/2020  ? Hyperlipidemia   ? goal LDL < 70  ? Hypertension   ? Myocardial infarction ([place]) 04/21/2016  ? OSA on CPAP 10/18/2011  ? doesn't wear it  ? Poor historian   ? talk to dtr pam hooker cell [phone]  ? Right knee pain   ? RIGHT KNEE MEDIAL MENSICAL TEAR  ? ? ?Past Surgical History:  ?Procedure Laterality Date  ? CARDIAC CATHETERIZATION  06/2017  ? In-stent restenosis cleared out  ? CARDIOVASCULAR STRESS TEST    ? Latter part of 2019--normal per cardiologist's note from 10/2017  ? [place]  ? X 2   SURGERIES    SLIGHT LIMITATION ROM  ? COLONOSCOPY  11/24/2004; 2018  ? NORMAL.  Recall 2016.  Repeat 2018->tubular adenoma x 1 (Dr. Cindee Salt, [facility]).  ? CORONARY ANGIOPLASTY WITH STENT PLACEMENT    ? CORONARY STENT INTERVENTION N/A 05/22/2018  ? Prox LAD for in stent restenosis.  DAPT x 1 yr.  Procedure: CORONARY STENT INTERVENTION;  Surgeon: Jettie Booze, MD;  Location: [place] [facility];  Service: Cardiovascular;  Laterality: N/A;  ? CYSTOSCOPY W/ RETROGRADES Bilateral 04/22/2020  ? Procedure: CYSTOSCOPY WITH RETROGRADE PYELOGRAM;  Surgeon: Remi Haggard, MD;  Location: [facility];  Service: Urology;  Laterality: Bilateral;  30 MINS  ? ESOPHAGOGASTRODUODENOSCOPY  05/2016  ? mild reactive changes  ? INTRAVASCULAR ULTRASOUND/IVUS N/A 05/22/2018  ? Procedure: Intravascular Ultrasound/IVUS;  Surgeon: Jettie Booze, MD;  Location: [place] [facility];  Service: Cardiovascular;  Laterality: N/A;  ? KNEE ARTHROSCOPY  10/22/2011  ? Procedure: ARTHROSCOPY KNEE;  Surgeon: Gearlean Alf, MD;  Location: [facility];  Service: Orthopedics;  Laterality: Right;  Right knee scope with debridement  ? LAPAROSCOPIC CHOLECYSTECTOMY  2018  ? LEFT HEART CATH AND CORONARY ANGIOGRAPHY N/A 05/22/2018  ? DES placed for in stent restenosis.  EF  55-60%.  Procedure: LEFT HEART CATH AND CORONARY ANGIOGRAPHY;  Surgeon: Jettie Booze, MD;  Location: [place] [facility];  Service: Cardiovascular;  Laterality: N/A;  ? LEFT HEART CATH AND CORONARY ANGIOGRAPHY N/A 08/30/2019  ? No change from 2020 cath->patent LAD stent, mild distal LAD dz.  No intervention required. Nitrates + possible future CCB antianginal for poss microvasc dz. Procedure: LEFT HEART CATH AND CORONARY ANGIOGRAPHY;  Surgeon: Jettie Booze, MD;  Location: [place] [facility];  Service: Cardiovascular;  Laterality: N/A;  ? LEFT HEART CATHETERIZATION WITH CORONARY ANGIOGRAM N/A 10/08/2012  ? Procedure: LEFT HEART CATHETERIZATION WITH CORONARY ANGIOGRAM;  Surgeon: Lorretta Harp, MD;  Location: [facility];  Service: Cardiovascular;  Laterality: N/A;  ? NASAL SINUS SURGERY  yrs ago  ? ONE SINUS SURGERY THRU NOSE AND ANOTHER SINUS SURGERY THRU INCISION ABOVE RT EYE  ? TRANSTHORACIC ECHOCARDIOGRAM  05/21/2018  ? EF 60-65%, grd I DD, no valvular probs.  ? TRANSTHORACIC ECHOCARDIOGRAM  05/21/2018  ? EF 60-65%, normal wall motion, +DD, normal valves and RV fxn.  ? TRANSURETHRAL RESECTION OF BLADDER TUMOR N/A 04/22/2020  ? PATH: NONinvasive high grade uroepithelial malignancy. Procedure: TRANSURETHRAL RESECTION OF BLADDER TUMOR (TURBT); FULGERATION;  Surgeon: Remi Haggard, MD;  Location: [facility];  Service: Urology;  Laterality: N/A;  ? ? ?Family History  ?Problem Relation Age of Onset  ? Arthritis Mother   ? Cancer Father   ? ? ?Social History  ? ?Socioeconomic History  ? Marital status: Married  ?  Spouse name: [name]  ? Number of children: Not on file  ? Years of education: Not on file  ? Highest education level: Not on file  ?Occupational History  ? Not on file  ?Tobacco Use  ? Smoking status: Never  ? Smokeless tobacco: Former  ?  Types: Chew  ?Vaping Use  ? Vaping Use: Never used  ?Substance and Sexual Activity  ? Alcohol use: No  ? Drug use: No  ? Sexual  activity: Not on file  ?Other Topics Concern  ? Not on file  ?Social History Narrative  ? Married, 2 daughters.  2 other daughters deceased.  ? Educ: HS  ? Occup: farmer, retired from [employer], Theme park manager at [employer].  ? No T/A/Ds  ? ?Social Determinants of Health  ? ?Financial Resource Strain: Not on file  ?Food Insecurity: Not on file  ?Transportation Needs: Not on file  ?Physical Activity: Not on file  ?Stress: Not on file  ?Social Connections: Not on file  ?Intimate Partner Violence: Not on file  ? ? ?Outpatient Medications Prior to Visit  ?Medication Sig Dispense Refill  ? acetaminophen (TYLENOL) 500 MG tablet Take 1,500 mg by mouth every 6 (six) hours as needed for mild pain or moderate pain (Sinus).    ?  amLODipine (NORVASC) 5 MG tablet Take 1 tablet (5 mg total) by mouth daily. 90 tablet 3  ? Apoaequorin (PREVAGEN) 10 MG CAPS Take 10 mg by mouth daily.    ? aspirin EC 81 MG EC tablet Take 1 tablet (81 mg total) by mouth daily.    ? atorvastatin (LIPITOR) 80 MG tablet Take 1 tablet (80 mg total) by mouth daily at 6 PM. 90 tablet 3  ? cholecalciferol (VITAMIN D) 1000 UNITS tablet Take 2,000 Units by mouth every evening.    ? clopidogrel (PLAVIX) 75 MG tablet Take 1 tablet (75 mg total) by mouth daily with breakfast. 90 tablet 3  ? escitalopram (LEXAPRO) 20 MG tablet Take 1 tablet (20 mg total) by mouth daily. 90 tablet 3  ? ezetimibe (ZETIA) 10 MG tablet Take 1 tablet (10 mg total) by mouth daily. 90 tablet 3  ? fenofibrate (TRICOR) 145 MG tablet Take 1 tablet (145 mg total) by mouth daily. 90 tablet 3  ? furosemide (LASIX) 40 MG tablet Take 1 tablet (40 mg total) by mouth daily. Take every  day except: Sunday 90 tablet 3  ? isosorbide mononitrate (IMDUR) 30 MG 24 hr tablet TAKE 1/2 TABLET (15 MG TOTAL) BY MOUTH DAILY. 45 tablet 0  ? metFORMIN (GLUCOPHAGE) 500 MG tablet Take 1 tablet (500 mg total) by mouth 2 (two) times daily with a meal.    ? pantoprazole (PROTONIX) 40 MG tablet Take 40 mg by mouth every  evening.    ? phenylephrine (SINUS RELIEF EXTRA STRENGTH) 1 % nasal spray Place 1 drop into both nostrils every 6 (six) hours as needed for congestion.    ? vitamin B-12 (CYANOCOBALAMIN) 1000 MCG tablet Take 1,

## 2021-07-28 ENCOUNTER — Telehealth: Payer: Self-pay | Admitting: Family Medicine

## 2021-07-28 NOTE — Telephone Encounter (Signed)
Spoke with patient he stated he cannot do Wednesdays for AWV

## 2021-07-29 DIAGNOSIS — C678 Malignant neoplasm of overlapping sites of bladder: Secondary | ICD-10-CM | POA: Diagnosis not present

## 2021-08-05 ENCOUNTER — Encounter: Payer: Self-pay | Admitting: Family Medicine

## 2021-08-29 ENCOUNTER — Ambulatory Visit (INDEPENDENT_AMBULATORY_CARE_PROVIDER_SITE_OTHER): Payer: HMO

## 2021-08-29 DIAGNOSIS — Z0000 Encounter for general adult medical examination without abnormal findings: Secondary | ICD-10-CM | POA: Diagnosis not present

## 2021-09-30 ENCOUNTER — Ambulatory Visit (INDEPENDENT_AMBULATORY_CARE_PROVIDER_SITE_OTHER): Payer: PPO | Admitting: Family Medicine

## 2021-09-30 ENCOUNTER — Encounter: Payer: Self-pay | Admitting: Family Medicine

## 2021-09-30 VITALS — BP 151/87 | HR 73 | Temp 98.5°F | Ht 67.0 in | Wt 246.8 lb

## 2021-09-30 DIAGNOSIS — I10 Essential (primary) hypertension: Secondary | ICD-10-CM

## 2021-09-30 DIAGNOSIS — E119 Type 2 diabetes mellitus without complications: Secondary | ICD-10-CM | POA: Diagnosis not present

## 2021-09-30 DIAGNOSIS — N1831 Chronic kidney disease, stage 3a: Secondary | ICD-10-CM | POA: Diagnosis not present

## 2021-09-30 LAB — BASIC METABOLIC PANEL
BUN: 11 mg/dL (ref 6–23)
CO2: 32 mEq/L (ref 19–32)
Calcium: 9.6 mg/dL (ref 8.4–10.5)
Chloride: 103 mEq/L (ref 96–112)
Creatinine, Ser: 1.03 mg/dL (ref 0.40–1.50)
GFR: 71.48 mL/min (ref >60.00)
Glucose, Bld: 130 mg/dL — ABNORMAL HIGH (ref 70–99)
Potassium: 4.1 mEq/L (ref 3.5–5.1)
Sodium: 142 mEq/L (ref 135–145)

## 2021-09-30 LAB — POCT GLYCOSYLATED HEMOGLOBIN (HGB A1C)
HbA1c POC (<> result, manual entry): 6.7 % (ref 4.0–5.6)
HbA1c, POC (controlled diabetic range): 6.7 % (ref 0.0–7.0)
HbA1c, POC (prediabetic range): 6.7 % — AB (ref 5.7–6.4)
Hemoglobin A1C: 6.7 % — AB (ref 4.0–5.6)

## 2021-09-30 LAB — MICROALBUMIN / CREATININE URINE RATIO
Creatinine,U: 306.2 mg/dL
Microalb Creat Ratio: 1.4 mg/g (ref 0.0–30.0)
Microalb, Ur: 4.3 mg/dL — ABNORMAL HIGH (ref 0.0–1.9)

## 2021-09-30 MED ORDER — METOPROLOL SUCCINATE ER 100 MG PO TB24: ORAL_TABLET | ORAL | 1 refills | Status: DC

## 2021-09-30 MED ORDER — GABAPENTIN 300 MG PO CAPS: 300.0000 mg | ORAL_CAPSULE | Freq: Two times a day (BID) | ORAL | 1 refills | Status: DC

## 2021-09-30 NOTE — Progress Notes (Signed)
OFFICE VISIT  09/30/2021  CC:  Chief Complaint  Patient presents with   Hypertension   Diabetes    Pt is fasting    HPI:    Patient is a 76 y.o. male who presents for 28-monthfollow-up DM, HTN, HLD, GAD with grief reaction (death of wife). A/P as of last visit: "#1 type 2 diabetes. Metformin 500 every morning and 1000 every afternoon. Nonfasting glucose and hemoglobin A1c today.   #2 hypertension, well controlled on amlodipine 5 mg a day, lisinopril 20 mg a day, Toprol-XL 100 mg daily.  He is also on Imdur 15 mg daily. Electrolytes and creatinine today.   #3 hypercholesterolemia, goal LDL less than 70. LDL was 125 about 8 months ago. Atorvastatin 80 mg a day and Zetia 10 mg a day. He is not interested in any additional cholesterol-lowering medication, specifically PCSK9 inhibitor.   #4 chronic renal insufficiency stage III. He avoids NSAIDs and tries to hydrate well. We will have to watch GFR and if consistently drops below 45 then we will have to get him off metformin.   #5  Grief response.  He is grieving appropriately for the loss of his wife about 1 month ago. Encouragement given today.   #6 Health maintenance exam: Reviewed age and gender appropriate health maintenance issues (prudent diet, regular exercise, health risks of tobacco and excessive alcohol, use of seatbelts, fire alarms in home, use of sunscreen).  Also reviewed age and gender appropriate health screening as well as vaccine recommendations. Vaccines: Pt declines flu, covid, and Tdap vaccines.  Shingrix->he declines.  Prevnar 13->declines. Labs: cbc,cmet,lipids,Hba1c, urine microalb/cr. Prostate ca screening: Through shared decision making process patient made decision to discontinue any prostate cancer screening. Colon ca screening: colonoscopy x 2 normal, most recent 2018."  INTERIM HX: BMikki Santeeis doing pretty good.  He is grieving over the relatively recent loss of his wife still, as expected.  He is not  sure about his med list and says he went over it with his daughter after last visit and he forgot the med sheet he was going to bring in which confirms what exactly he is taking at home.  No home glucose or blood pressure monitoring.   Past Medical History:  Diagnosis Date   Chronic renal insufficiency, stage III (moderate) (HCC)    Coronary artery disease    a. DES to LAD 04/2011 - took Effient/ASA x 1 yr, now on ASA only. b. Cath 10/2012: patent stent, otherwise normal cors. c. 05/2018 Prox LAD stent 75% restenosed, DES placed.  08/30/19 cath: patent stent, mild distal LAD dz not requiring intervention, LVEF nl, EDP normal: maximize antianginal meds.   COVID 04/04/2020   asymptomatic   Diabetes mellitus with complication ([place] 096/2952  A1c 6.9%   GERD (gastroesophageal reflux disease)    HAS HAD ESOPHAGUS STRETCHED SEVERAL TIMES IN THE PAST   History of bladder cancer 04/2020   TURBT 04/2020.  Surveillance cysto clear as of 07/2021   Hyperlipidemia    goal LDL < 70   Hypertension    [place]   OSA on CPAP 10/18/2011   doesn't wear it   Poor historian    talk to dtr pam hooker cell [phone]   Right knee pain    RIGHT KNEE MEDIAL MENSICAL TEAR    Past Surgical History:  Procedure Laterality Date   CARDIAC CATHETERIZATION  06/2017   In-stent restenosis cleared out   CARDIOVASCULAR STRESS TEST  Latter part of 2019--normal per cardiologist's note from 10/2017   [place]   X 2   SURGERIES    SLIGHT LIMITATION ROM   COLONOSCOPY  11/24/2004; 2018   NORMAL.  Recall 2016.  Repeat 2018->tubular adenoma x 1 (Dr. Cindee Salt, Dig [facility] spec).   CORONARY ANGIOPLASTY WITH STENT PLACEMENT     CORONARY STENT INTERVENTION N/A 05/22/2018   Prox LAD for in stent restenosis.  DAPT x 1 yr.  Procedure: CORONARY STENT INTERVENTION;  Surgeon: Jettie Booze, MD;  Location: [place] [facility];  Service: Cardiovascular;  Laterality: N/A;   CYSTOSCOPY  W/ RETROGRADES Bilateral 04/22/2020   Procedure: CYSTOSCOPY WITH RETROGRADE PYELOGRAM;  Surgeon: Remi Haggard, MD;  Location: [facility];  Service: Urology;  Laterality: Bilateral;  30 MINS   ESOPHAGOGASTRODUODENOSCOPY  05/2016   mild reactive changes   INTRAVASCULAR ULTRASOUND/IVUS N/A 05/22/2018   Procedure: Intravascular Ultrasound/IVUS;  Surgeon: Jettie Booze, MD;  Location: [place] [facility];  Service: Cardiovascular;  Laterality: N/A;   KNEE ARTHROSCOPY  10/22/2011   Procedure: ARTHROSCOPY KNEE;  Surgeon: Gearlean Alf, MD;  Location: [facility];  Service: Orthopedics;  Laterality: Right;  Right knee scope with debridement   LAPAROSCOPIC CHOLECYSTECTOMY  2018   LEFT HEART CATH AND CORONARY ANGIOGRAPHY N/A 05/22/2018   DES placed for in stent restenosis.  EF 55-60%.  Procedure: LEFT HEART CATH AND CORONARY ANGIOGRAPHY;  Surgeon: Jettie Booze, MD;  Location: [place] [facility];  Service: Cardiovascular;  Laterality: N/A;   LEFT HEART CATH AND CORONARY ANGIOGRAPHY N/A 08/30/2019   No change from 2020 cath->patent LAD stent, mild distal LAD dz.  No intervention required. Nitrates + possible future CCB antianginal for poss microvasc dz. Procedure: LEFT HEART CATH AND CORONARY ANGIOGRAPHY;  Surgeon: Jettie Booze, MD;  Location: [place] [facility];  Service: Cardiovascular;  Laterality: N/A;   LEFT HEART CATHETERIZATION WITH CORONARY ANGIOGRAM N/A 10/08/2012   Procedure: LEFT HEART CATHETERIZATION WITH CORONARY ANGIOGRAM;  Surgeon: Lorretta Harp, MD;  Location: [facility];  Service: Cardiovascular;  Laterality: N/A;   NASAL SINUS SURGERY  yrs ago   ONE SINUS SURGERY THRU NOSE AND ANOTHER SINUS SURGERY THRU INCISION ABOVE RT EYE   TRANSTHORACIC ECHOCARDIOGRAM  05/21/2018   EF 60-65%, grd I DD, no valvular probs.   TRANSTHORACIC ECHOCARDIOGRAM  05/21/2018   EF 60-65%, normal wall motion, +DD, normal valves and RV fxn.   TRANSURETHRAL RESECTION OF  BLADDER TUMOR N/A 04/22/2020   PATH: NONinvasive high grade uroepithelial malignancy. Procedure: TRANSURETHRAL RESECTION OF BLADDER TUMOR (TURBT); FULGERATION;  Surgeon: Remi Haggard, MD;  Location: [facility];  Service: Urology;  Laterality: N/A;    Outpatient Medications Prior to Visit  Medication Sig Dispense Refill   acetaminophen (TYLENOL) 500 MG tablet Take 500 mg by mouth as needed for mild pain or moderate pain (Sinus).     amLODipine (NORVASC) 5 MG tablet Take 1 tablet (5 mg total) by mouth daily. 90 tablet 3   Apoaequorin (PREVAGEN) 10 MG CAPS Take 10 mg by mouth daily.     aspirin EC 81 MG EC tablet Take 1 tablet (81 mg total) by mouth daily.     atorvastatin (LIPITOR) 80 MG tablet Take 1 tablet (80 mg total) by mouth daily at 6 PM. 90 tablet 3   cholecalciferol (VITAMIN D) 1000 UNITS tablet Take 2,000 Units by mouth every evening.     clopidogrel (PLAVIX) 75  MG tablet Take 1 tablet (75 mg total) by mouth daily with breakfast. 90 tablet 3   escitalopram (LEXAPRO) 20 MG tablet Take 1 tablet (20 mg total) by mouth daily. 90 tablet 3   ezetimibe (ZETIA) 10 MG tablet Take 1 tablet (10 mg total) by mouth daily. 90 tablet 3   fenofibrate (TRICOR) 145 MG tablet Take 1 tablet (145 mg total) by mouth daily. 90 tablet 3   furosemide (LASIX) 40 MG tablet Take 1 tablet (40 mg total) by mouth daily. Take every  day except: Sunday 90 tablet 3   isosorbide mononitrate (IMDUR) 30 MG 24 hr tablet TAKE 1/2 TABLET (15 MG TOTAL) BY MOUTH DAILY. 45 tablet 0   lisinopril (ZESTRIL) 40 MG tablet TAKE 1/2 TABLET (20 MG TOTAL) BY MOUTH DAILY. 45 tablet 3   metFORMIN (GLUCOPHAGE) 500 MG tablet Take 1 tablet (500 mg total) by mouth 2 (two) times daily with a meal.     pantoprazole (PROTONIX) 40 MG tablet Take 40 mg by mouth every evening.     vitamin B-12 (CYANOCOBALAMIN) 1000 MCG tablet Take 1,000 mcg by mouth every other day.     nitroGLYCERIN (NITROSTAT) 0.4 MG SL tablet PLACE 1 TABLET  UNDER THE TONGUE EVERY 5 MINUTES X 3 DOSES AS NEEDED FOR CHEST PAIN. (Patient not taking: Reported on 12/17/2020) 25 tablet 6   gabapentin (NEURONTIN) 300 MG capsule Take 1 capsule (300 mg total) by mouth 2 (two) times daily. 180 capsule 1   metoprolol succinate (TOPROL-XL) 100 MG 24 hr tablet TAKE 1 TABLET BY MOUTH IN THE MORNING AND AT BEDTIME. TAKE WITH OR IMMEDIATELY FOLLOWING A MEAL. 180 tablet 1   phenylephrine (SINUS RELIEF EXTRA STRENGTH) 1 % nasal spray Place 1 drop into both nostrils every 6 (six) hours as needed for congestion. (Patient not taking: Reported on 09/30/2021)     No facility-administered medications prior to visit.    No Known Allergies  ROS As per HPI  PE:    09/30/2021   10:09 AM 09/30/2021   10:02 AM 07/01/2021    9:49 AM  Vitals with BMI  Height  '5\' 7"'$  '5\' 7"'$   Weight  246 lbs 13 oz 244 lbs  BMI  57.32 20.25  Systolic 427 062 376  Diastolic 87 81 72  Pulse  73 63     Physical Exam  Gen: Alert, well appearing.  Patient is oriented to person, place, time, and situation. AFFECT: pleasant, lucid thought and speech. EXT: no clubbing or cyanosis.  no edema.   Foot exam - no swelling, tenderness or skin or vascular lesions. Color and temperature is normal. Sensation is intact. Peripheral pulses are palpable. Toenails are normal.   LABS:  Last CBC Lab Results  Component Value Date   WBC 8.7 07/01/2021   HGB 14.0 07/01/2021   HCT 41.6 07/01/2021   MCV 97.0 07/01/2021   MCH 31.3 05/22/2018   RDW 13.4 07/01/2021   PLT 189.0 28/31/5176   Last metabolic panel Lab Results  Component Value Date   GLUCOSE 119 (H) 07/01/2021   NA 143 07/01/2021   K 4.2 07/01/2021   CL 103 07/01/2021   CO2 33 (H) 07/01/2021   BUN 19 07/01/2021   CREATININE 1.35 07/01/2021   GFRNONAA 74 08/18/2018   CALCIUM 9.3 07/01/2021   PROT 6.2 07/01/2021   ALBUMIN 4.3 07/01/2021   BILITOT 0.7 07/01/2021   ALKPHOS 40 07/01/2021   AST 15 07/01/2021   ALT 10 07/01/2021    ANIONGAP  8 05/22/2018   Last lipids Lab Results  Component Value Date   CHOL 180 03/11/2021   HDL 45.60 03/11/2021   LDLCALC 125 (H) 12/03/2020   LDLDIRECT 115.0 03/11/2021   TRIG 206.0 (H) 03/11/2021   CHOLHDL 4 03/11/2021   Last hemoglobin A1c Lab Results  Component Value Date   HGBA1C 7.2 (H) 07/01/2021   Last thyroid functions Lab Results  Component Value Date   TSH 1.54 08/24/2019   POC Hba1c today is 6.7%  IMPRESSION AND PLAN:  #1 type 2 diabetes. POC Hba1c today is 6.7%.   Continue metformin 500 mg twice a day.  #2 hypertension, poor control. He should be on amlodipine 5 mg a day, and lisinopril one half of a 40 mg tablet daily as well as Toprol-XL 100 mg twice a day.  I will wait till he drops off his med list at home before making any changes today. Basic metabolic panel today.  #3 chronic renal insufficiency stage III. He avoids NSAIDs. Basic metabolic panel today.  An After Visit Summary was printed and given to the patient.  FOLLOW UP: Return in about 3 months (around 12/31/2021) for routine chronic illness f/u. Next cpe 06/2022  Signed:  Crissie Sickles, MD           09/30/2021

## 2021-10-01 ENCOUNTER — Telehealth: Payer: Self-pay

## 2021-10-01 NOTE — Telephone Encounter (Signed)
Patient returning call regarding results.  Please call patient again

## 2021-10-01 NOTE — Telephone Encounter (Signed)
Spoke with pt regarding results/recommendations,voiced understanding. ? ?

## 2021-10-12 ENCOUNTER — Other Ambulatory Visit: Payer: Self-pay

## 2021-10-19 ENCOUNTER — Other Ambulatory Visit: Payer: Self-pay

## 2021-12-21 ENCOUNTER — Encounter (HOSPITAL_BASED_OUTPATIENT_CLINIC_OR_DEPARTMENT_OTHER): Payer: Self-pay | Admitting: Family

## 2021-12-30 ENCOUNTER — Ambulatory Visit (INDEPENDENT_AMBULATORY_CARE_PROVIDER_SITE_OTHER): Payer: HMO | Admitting: Family Medicine

## 2021-12-30 ENCOUNTER — Encounter: Payer: Self-pay | Admitting: Family Medicine

## 2021-12-30 VITALS — BP 126/78 | HR 56 | Temp 98.7°F | Ht 67.0 in | Wt 233.2 lb

## 2021-12-30 DIAGNOSIS — E1121 Type 2 diabetes mellitus with diabetic nephropathy: Secondary | ICD-10-CM

## 2021-12-30 DIAGNOSIS — E7800 Pure hypercholesterolemia, unspecified: Secondary | ICD-10-CM

## 2021-12-30 DIAGNOSIS — N1831 Chronic kidney disease, stage 3a: Secondary | ICD-10-CM | POA: Diagnosis not present

## 2021-12-30 DIAGNOSIS — I10 Essential (primary) hypertension: Secondary | ICD-10-CM | POA: Diagnosis not present

## 2021-12-30 LAB — POCT GLYCOSYLATED HEMOGLOBIN (HGB A1C)
HbA1c POC (<> result, manual entry): 6.7 % (ref 4.0–5.6)
HbA1c, POC (controlled diabetic range): 6.7 % (ref 0.0–7.0)
HbA1c, POC (prediabetic range): 6.7 % — AB (ref 5.7–6.4)
Hemoglobin A1C: 6.7 % — AB (ref 4.0–5.6)

## 2021-12-30 NOTE — Progress Notes (Signed)
OFFICE VISIT  12/30/2021  CC:  Chief Complaint  Patient presents with   Diabetes   Hypertension    Pt is fasting   Chronic Kidney Disease         Patient is a 76 y.o. male who presents for 8-monthfollow-up diabetes, hypertension, and chronic renal insufficiency stage III. A/P as of last visit: "#1 type 2 diabetes. POC Hba1c today is 6.7%.   Continue metformin 500 mg twice a day.   #2 hypertension, poor control. He should be on amlodipine 5 mg a day, and lisinopril one half of a 40 mg tablet daily as well as Toprol-XL 100 mg twice a day.  I will wait till he drops off his med list at home before making any changes today. Basic metabolic panel today.   #3 chronic renal insufficiency stage III. He avoids NSAIDs. Basic metabolic panel today."  INTERIM HX: BMikki Santeesays he is feeling well. He continues to feel like he is growing through his grieving process for his wife who died about 7 months ago. He does say his appetite was down initially and he lost some weight but feels like it is back to normal lately.  No home blood pressure or glucose monitoring.  ROS as above, plus--> no fevers, no CP, no SOB, no wheezing, no cough, no dizziness, no HAs, no rashes, no melena/hematochezia.  No polyuria or polydipsia.  No myalgias or arthralgias.  No focal weakness, paresthesias, or tremors.  No acute vision or hearing abnormalities.  No dysuria or unusual/new urinary urgency or frequency.  No recent changes in lower legs. No n/v/d or abd pain.  No palpitations.     Past Medical History:  Diagnosis Date   Chronic renal insufficiency, stage III (moderate) (HCC)    Coronary artery disease    a. DES to LAD 04/2011 - took Effient/ASA x 1 yr, now on ASA only. b. Cath 10/2012: patent stent, otherwise normal cors. c. 05/2018 Prox LAD stent 75% restenosed, DES placed.  08/30/19 cath: patent stent, mild distal LAD dz not requiring intervention, LVEF nl, EDP normal: maximize antianginal meds.   COVID  04/04/2020   asymptomatic   Diabetes mellitus with complication ([place] 083/4196  A1c 6.9%   GERD (gastroesophageal reflux disease)    HAS HAD ESOPHAGUS STRETCHED SEVERAL TIMES IN THE PAST   History of bladder cancer 04/2020   TURBT 04/2020.  Surveillance cysto clear as of 07/2021   Hyperlipidemia    goal LDL < 70   Hypertension    [place]   OSA on CPAP 10/18/2011   doesn't wear it   Poor historian    talk to dtr pam hooker cell [phone]   Right knee pain    RIGHT KNEE MEDIAL MENSICAL TEAR    Past Surgical History:  Procedure Laterality Date   CARDIAC CATHETERIZATION  06/2017   In-stent restenosis cleared out   CARDIOVASCULAR STRESS TEST     Latter part of 2019--normal per cardiologist's note from 10/2017   [place]   X 2   SURGERIES    SLIGHT LIMITATION ROM   COLONOSCOPY  11/24/2004; 2018   NORMAL.  Recall 2016.  Repeat 2018->tubular adenoma x 1 (Dr. JCindee Salt Dig [facility]a spec).   CORONARY ANGIOPLASTY WITH STENT PLACEMENT     CORONARY STENT INTERVENTION N/A 05/22/2018   Prox LAD for in stent restenosis.  DAPT x 1 yr.  Procedure: CORONARY STENT INTERVENTION;  Surgeon: VJettie Booze  MD;  Location: [place] [facility];  Service: Cardiovascular;  Laterality: N/A;   CYSTOSCOPY W/ RETROGRADES Bilateral 04/22/2020   Procedure: CYSTOSCOPY WITH RETROGRADE PYELOGRAM;  Surgeon: Remi Haggard, MD;  Location: [facility];  Service: Urology;  Laterality: Bilateral;  30 MINS   ESOPHAGOGASTRODUODENOSCOPY  05/2016   mild reactive changes   INTRAVASCULAR ULTRASOUND/IVUS N/A 05/22/2018   Procedure: Intravascular Ultrasound/IVUS;  Surgeon: Jettie Booze, MD;  Location: [place] [facility];  Service: Cardiovascular;  Laterality: N/A;   KNEE ARTHROSCOPY  10/22/2011   Procedure: ARTHROSCOPY KNEE;  Surgeon: Gearlean Alf, MD;  Location: [facility];  Service: Orthopedics;  Laterality: Right;  Right knee scope with debridement    LAPAROSCOPIC CHOLECYSTECTOMY  2018   LEFT HEART CATH AND CORONARY ANGIOGRAPHY N/A 05/22/2018   DES placed for in stent restenosis.  EF 55-60%.  Procedure: LEFT HEART CATH AND CORONARY ANGIOGRAPHY;  Surgeon: Jettie Booze, MD;  Location: [place] [facility];  Service: Cardiovascular;  Laterality: N/A;   LEFT HEART CATH AND CORONARY ANGIOGRAPHY N/A 08/30/2019   No change from 2020 cath->patent LAD stent, mild distal LAD dz.  No intervention required. Nitrates + possible future CCB antianginal for poss microvasc dz. Procedure: LEFT HEART CATH AND CORONARY ANGIOGRAPHY;  Surgeon: Jettie Booze, MD;  Location: [place] [facility];  Service: Cardiovascular;  Laterality: N/A;   LEFT HEART CATHETERIZATION WITH CORONARY ANGIOGRAM N/A 10/08/2012   Procedure: LEFT HEART CATHETERIZATION WITH CORONARY ANGIOGRAM;  Surgeon: Lorretta Harp, MD;  Location[facility];  Service: Cardiovascular;  Laterality: N/A;   NASAL SINUS SURGERY  yrs ago   ONE SINUS SURGERY THRU NOSE AND ANOTHER SINUS SURGERY THRU INCISION ABOVE RT EYE   TRANSTHORACIC ECHOCARDIOGRAM  05/21/2018   EF 60-65%, grd I DD, no valvular probs.   TRANSTHORACIC ECHOCARDIOGRAM  05/21/2018   EF 60-65%, normal wall motion, +DD, normal valves and RV fxn.   TRANSURETHRAL RESECTION OF BLADDER TUMOR N/A 04/22/2020   PATH: NONinvasive high grade uroepithelial malignancy. Procedure: TRANSURETHRAL RESECTION OF BLADDER TUMOR (TURBT); FULGERATION;  Surgeon: Remi Haggard, MD;  Location: [facility];  Service: Urology;  Laterality: N/A;    Outpatient Medications Prior to Visit  Medication Sig Dispense Refill   acetaminophen (TYLENOL) 500 MG tablet Take 500 mg by mouth as needed for mild pain or moderate pain (Sinus).     aspirin EC 81 MG EC tablet Take 1 tablet (81 mg total) by mouth daily.     atorvastatin (LIPITOR) 80 MG tablet Take 1 tablet (80 mg total) by mouth daily at 6 PM. 90 tablet 3   clopidogrel (PLAVIX) 75 MG tablet  Take 1 tablet (75 mg total) by mouth daily with breakfast. 90 tablet 3   escitalopram (LEXAPRO) 20 MG tablet Take 1 tablet (20 mg total) by mouth daily. 90 tablet 3   ezetimibe (ZETIA) 10 MG tablet Take 1 tablet (10 mg total) by mouth daily. 90 tablet 3   furosemide (LASIX) 40 MG tablet Take 1 tablet (40 mg total) by mouth daily. Take every  day except: Sunday 90 tablet 3   gabapentin (NEURONTIN) 300 MG capsule Take 1 capsule (300 mg total) by mouth 2 (two) times daily. 180 capsule 1   lisinopril (ZESTRIL) 40 MG tablet TAKE 1/2 TABLET (20 MG TOTAL) BY MOUTH DAILY. 45 tablet 3   metFORMIN (GLUCOPHAGE) 500 MG tablet Take 1 tablet (500 mg total) by mouth 2 (two) times daily with a meal.  metoprolol succinate (TOPROL-XL) 100 MG 24 hr tablet TAKE 1 TABLET BY MOUTH IN THE MORNING AND AT BEDTIME. TAKE WITH OR IMMEDIATELY FOLLOWING A MEAL. 180 tablet 1   vitamin B-12 (CYANOCOBALAMIN) 1000 MCG tablet Take 1,000 mcg by mouth every other day.     nitroGLYCERIN (NITROSTAT) 0.4 MG SL tablet PLACE 1 TABLET UNDER THE TONGUE EVERY 5 MINUTES X 3 DOSES AS NEEDED FOR CHEST PAIN. (Patient not taking: Reported on 12/17/2020) 25 tablet 6   No facility-administered medications prior to visit.    No Known Allergies  ROS As per HPI  PE:    12/30/2021   10:05 AM 09/30/2021   10:09 AM 09/30/2021   10:02 AM  Vitals with BMI  Height '5\' 7"'$   '5\' 7"'$   Weight 233 lbs 3 oz  246 lbs 13 oz  BMI 25.85  27.78  Systolic 242 353 614  Diastolic 78 87 81  Pulse 56  73     Physical Exam  Gen: Alert, well appearing.  Patient is oriented to person, place, time, and situation. AFFECT: pleasant, lucid thought and speech. CV: RRR, no m/r/g.   LUNGS: CTA bilat, nonlabored resps, good aeration in all lung fields. EXT: no clubbing or cyanosis.  no edema.    LABS:  Last CBC Lab Results  Component Value Date   WBC 8.7 07/01/2021   HGB 14.0 07/01/2021   HCT 41.6 07/01/2021   MCV 97.0 07/01/2021   MCH 31.3 05/22/2018    RDW 13.4 07/01/2021   PLT 189.0 43/15/4008   Last metabolic panel Lab Results  Component Value Date   GLUCOSE 130 (H) 09/30/2021   NA 142 09/30/2021   K 4.1 09/30/2021   CL 103 09/30/2021   CO2 32 09/30/2021   BUN 11 09/30/2021   CREATININE 1.03 09/30/2021   GFRNONAA 74 08/18/2018   CALCIUM 9.6 09/30/2021   PROT 6.2 07/01/2021   ALBUMIN 4.3 07/01/2021   BILITOT 0.7 07/01/2021   ALKPHOS 40 07/01/2021   AST 15 07/01/2021   ALT 10 07/01/2021   ANIONGAP 8 05/22/2018   Last lipids Lab Results  Component Value Date   CHOL 180 03/11/2021   HDL 45.60 03/11/2021   LDLCALC 125 (H) 12/03/2020   LDLDIRECT 115.0 03/11/2021   TRIG 206.0 (H) 03/11/2021   CHOLHDL 4 03/11/2021   Last hemoglobin A1c Lab Results  Component Value Date   HGBA1C 6.7 (A) 12/30/2021   HGBA1C 6.7 12/30/2021   HGBA1C 6.7 (A) 12/30/2021   HGBA1C 6.7 12/30/2021   Last thyroid functions Lab Results  Component Value Date   TSH 1.54 08/24/2019   IMPRESSION AND PLAN:  #1 diabetes with nephropathy. Good control on metformin 500 mg twice daily. POC Hba1c today is 6.7%  #2 hypertension, doing well on lisinopril 20 mg a day and Toprol-XL 100 mg twice daily. Electrolytes and creatinine today.  3.  Hypercholesterolemia. He is on Zetia 10 mg a day and a atorvastatin 80 mg a day. Last LDL in January of this year was 125, triglycerides 206, HDL 45. Discussed possibility of referral to advanced lipid clinic today for consideration of PCSK9 inhibitor but he declined.  #4 coronary artery disease. Ongoing DAPT, beta-blocker, statin. Last cardiology follow-up was 1 year ago.  An After Visit Summary was printed and given to the patient.  FOLLOW UP: Return in about 3 months (around 04/01/2022) for routine chronic illness f/u. Next cpe 06/2022  Signed:  Crissie Sickles, MD  12/30/2021  

## 2021-12-31 LAB — COMPREHENSIVE METABOLIC PANEL
ALT: 7 U/L (ref 0–53)
AST: 20 U/L (ref 0–37)
Albumin: 4.3 g/dL (ref 3.5–5.2)
Alkaline Phosphatase: 47 U/L (ref 39–117)
BUN: 12 mg/dL (ref 6–23)
CO2: 26 mEq/L (ref 19–32)
Calcium: 9.4 mg/dL (ref 8.4–10.5)
Chloride: 105 mEq/L (ref 96–112)
Creatinine, Ser: 0.9 mg/dL (ref 0.40–1.50)
GFR: 83.89 mL/min (ref >60.00)
Glucose, Bld: 119 mg/dL — ABNORMAL HIGH (ref 70–99)
Potassium: 6 mEq/L — ABNORMAL HIGH (ref 3.5–5.1)
Sodium: 142 mEq/L (ref 135–145)
Total Bilirubin: 1.6 mg/dL — ABNORMAL HIGH (ref 0.2–1.2)
Total Protein: 6.3 g/dL (ref 6.0–8.3)

## 2021-12-31 LAB — LIPID PANEL
Cholesterol: 125 mg/dL (ref 0–200)
HDL: 44.6 mg/dL (ref >39.00)
LDL Cholesterol: 53 mg/dL (ref 0–99)
NonHDL: 80.19
Total CHOL/HDL Ratio: 3
Triglycerides: 134 mg/dL (ref 0.0–149.0)
VLDL: 26.8 mg/dL (ref 0.0–40.0)

## 2022-01-04 ENCOUNTER — Other Ambulatory Visit: Payer: Self-pay | Admitting: Family Medicine

## 2022-01-22 ENCOUNTER — Other Ambulatory Visit: Payer: Self-pay | Admitting: Family Medicine

## 2022-02-03 LAB — HM DIABETES EYE EXAM

## 2022-03-17 ENCOUNTER — Other Ambulatory Visit: Payer: Self-pay | Admitting: Family Medicine

## 2022-03-17 DIAGNOSIS — C678 Malignant neoplasm of overlapping sites of bladder: Secondary | ICD-10-CM | POA: Diagnosis not present

## 2022-03-31 ENCOUNTER — Ambulatory Visit (INDEPENDENT_AMBULATORY_CARE_PROVIDER_SITE_OTHER): Payer: HMO | Admitting: Family Medicine

## 2022-03-31 ENCOUNTER — Encounter: Payer: Self-pay | Admitting: Family Medicine

## 2022-03-31 VITALS — BP 110/69 | HR 58 | Temp 97.7°F | Ht 67.0 in | Wt 232.0 lb

## 2022-03-31 DIAGNOSIS — I10 Essential (primary) hypertension: Secondary | ICD-10-CM

## 2022-03-31 DIAGNOSIS — E1121 Type 2 diabetes mellitus with diabetic nephropathy: Secondary | ICD-10-CM

## 2022-03-31 DIAGNOSIS — E7800 Pure hypercholesterolemia, unspecified: Secondary | ICD-10-CM

## 2022-03-31 DIAGNOSIS — N1831 Chronic kidney disease, stage 3a: Secondary | ICD-10-CM

## 2022-03-31 LAB — BASIC METABOLIC PANEL
BUN: 15 mg/dL (ref 6–23)
CO2: 31 mEq/L (ref 19–32)
Calcium: 9.4 mg/dL (ref 8.4–10.5)
Chloride: 104 mEq/L (ref 96–112)
Creatinine, Ser: 1.13 mg/dL (ref 0.40–1.50)
GFR: 63.73 mL/min (ref >60.00)
Glucose, Bld: 131 mg/dL — ABNORMAL HIGH (ref 70–99)
Potassium: 4.4 mEq/L (ref 3.5–5.1)
Sodium: 141 mEq/L (ref 135–145)

## 2022-03-31 LAB — POCT GLYCOSYLATED HEMOGLOBIN (HGB A1C)
HbA1c POC (<> result, manual entry): 6.3 % (ref 4.0–5.6)
HbA1c, POC (controlled diabetic range): 6.3 % (ref 0.0–7.0)
HbA1c, POC (prediabetic range): 6.3 % (ref 5.7–6.4)
Hemoglobin A1C: 6.3 % — AB (ref 4.0–5.6)

## 2022-03-31 MED ORDER — ESCITALOPRAM OXALATE 20 MG PO TABS: 20.0000 mg | ORAL_TABLET | Freq: Every day | ORAL | 1 refills | Status: DC

## 2022-03-31 MED ORDER — CLOPIDOGREL BISULFATE 75 MG PO TABS: 75.0000 mg | ORAL_TABLET | Freq: Every day | ORAL | 1 refills | Status: DC

## 2022-03-31 MED ORDER — GABAPENTIN 300 MG PO CAPS: 300.0000 mg | ORAL_CAPSULE | Freq: Two times a day (BID) | ORAL | 1 refills | Status: DC

## 2022-03-31 MED ORDER — FUROSEMIDE 40 MG PO TABS: 40.0000 mg | ORAL_TABLET | Freq: Every day | ORAL | 1 refills | Status: DC

## 2022-03-31 MED ORDER — LISINOPRIL 40 MG PO TABS: ORAL_TABLET | ORAL | 1 refills | Status: DC

## 2022-03-31 MED ORDER — ATORVASTATIN CALCIUM 80 MG PO TABS: 80.0000 mg | ORAL_TABLET | Freq: Every day | ORAL | 1 refills | Status: DC

## 2022-03-31 MED ORDER — METOPROLOL SUCCINATE ER 100 MG PO TB24: ORAL_TABLET | ORAL | 1 refills | Status: DC

## 2022-03-31 NOTE — Progress Notes (Signed)
OFFICE VISIT  03/31/2022  CC:  Chief Complaint  Patient presents with   Medical Management of Chronic Issues    Pt is fasting   Patient is a 77 y.o. male who presents for 57-monthfollow-up diabetes, hypertension, hyperlipidemia, and chronic renal insufficiency stage III. A/P as of last visit: "#1 diabetes with nephropathy. Good control on metformin 500 mg twice daily. POC Hba1c today is 6.7%   #2 hypertension, doing well on lisinopril 20 mg a day and Toprol-XL 100 mg twice daily. Electrolytes and creatinine today.   3.  Hypercholesterolemia. He is on Zetia 10 mg a day and a atorvastatin 80 mg a day. Last LDL in January of this year was 125, triglycerides 206, HDL 45. Discussed possibility of referral to advanced lipid clinic today for consideration of PCSK9 inhibitor but he declined.   #4 coronary artery disease. Ongoing DAPT, beta-blocker, statin. Last cardiology follow-up was 1 year ago."  INTERIM HX: BMikki Santeeis feeling well. He is happy because he is getting married in a few days!  No problems with medications.  He does take his Lasix every day.  ROS as above, plus--> he does have an occasional feeling of brief lightheadedness when he first gets out of bed in the morning. No fevers, no CP, no SOB, no wheezing, no cough, no HAs, no rashes, no melena/hematochezia.  No polyuria or polydipsia.  No myalgias or arthralgias.  No focal weakness, paresthesias, or tremors.  No acute vision or hearing abnormalities.  No dysuria or unusual/new urinary urgency or frequency.  No recent changes in lower legs. No n/v/d or abd pain.  No palpitations.      Past Medical History:  Diagnosis Date   Chronic renal insufficiency, stage III (moderate) (HCC)    Coronary artery disease    a. DES to LAD 04/2011 - took Effient/ASA x 1 yr, now on ASA only. b. Cath 10/2012: patent stent, otherwise normal cors. c. 05/2018 Prox LAD stent 75% restenosed, DES placed.  08/30/19 cath: patent stent, mild distal LAD  dz not requiring intervention, LVEF nl, EDP normal: maximize antianginal meds.   COVID 04/04/2020   asymptomatic   Diabetes mellitus with complication ([facility] 017/6160  A1c 6.9%   GERD (gastroesophageal reflux disease)    HAS HAD ESOPHAGUS STRETCHED SEVERAL TIMES IN THE PAST   History of bladder cancer 04/2020   TURBT 04/2020.  Surveillance cysto clear as of 07/2021   Hyperlipidemia    goal LDL < 70   Hypertension    Myocardial infarction ([facility] 04/21/2016   OSA on CPAP 10/18/2011   doesn't wear it   Poor historian    talk to dtr pam hooker cell [phone]   Right knee pain    RIGHT KNEE MEDIAL MENSICAL TEAR    Past Surgical History:  Procedure Laterality Date   CARDIAC CATHETERIZATION  06/2017   In-stent restenosis cleared out   CARDIOVASCULAR STRESS TEST     Latter part of 2019--normal per cardiologist's note from 10/2017   [place] 1993   X 2   SURGERIES    SLIGHT LIMITATION ROM   COLONOSCOPY  11/24/2004; 2018   NORMAL.  Recall 2016.  Repeat 2018->tubular adenoma x 1 (Dr. JCindee [facility] spec).   CORONARY ANGIOPLASTY WITH STENT PLACEMENT     CORONARY STENT INTERVENTION N/A 05/22/2018   Prox LAD for in stent restenosis.  DAPT x 1 yr.  Procedure: CORONARY STENT INTERVENTION;  Surgeon: VJettie Booze MD;  Location: [place] [facility];  Service: Cardiovascular;  Laterality: N/A;   CYSTOSCOPY W/ RETROGRADES Bilateral 04/22/2020   Procedure: CYSTOSCOPY WITH RETROGRADE PYELOGRAM;  Surgeon: Remi Haggard, MD;  Location[facility];  Service: Urology;  Laterality: Bilateral;  30 MINS   ESOPHAGOGASTRODUODENOSCOPY  05/2016   mild reactive changes   INTRAVASCULAR ULTRASOUND/IVUS N/A 05/22/2018   Procedure: Intravascular Ultrasound/IVUS;  Surgeon: Jettie Booze, MD;  Location: [place] [facility];  Service: Cardiovascular;  Laterality: N/A;   KNEE ARTHROSCOPY  10/22/2011   Procedure: ARTHROSCOPY KNEE;  Surgeon: Gearlean Alf, MD;  Location:  [facility];  Service: Orthopedics;  Laterality: Right;  Right knee scope with debridement   LAPAROSCOPIC CHOLECYSTECTOMY  2018   LEFT HEART CATH AND CORONARY ANGIOGRAPHY N/A 05/22/2018   DES placed for in stent restenosis.  EF 55-60%.  Procedure: LEFT HEART CATH AND CORONARY ANGIOGRAPHY;  Surgeon: Jettie Booze, MD;  Location: [place] [facility];  Service: Cardiovascular;  Laterality: N/A;   LEFT HEART CATH AND CORONARY ANGIOGRAPHY N/A 08/30/2019   No change from 2020 cath->patent LAD stent, mild distal LAD dz.  No intervention required. Nitrates + possible future CCB antianginal for poss microvasc dz. Procedure: LEFT HEART CATH AND CORONARY ANGIOGRAPHY;  Surgeon: Jettie Booze, MD;  Location: [facility];  Service: Cardiovascular;  Laterality: N/A;   LEFT HEART CATHETERIZATION WITH CORONARY ANGIOGRAM N/A 10/08/2012   Procedure: LEFT HEART CATHETERIZATION WITH CORONARY ANGIOGRAM;  Surgeon: Lorretta Harp, MD;  Location: [facility];  Service: Cardiovascular;  Laterality: N/A;   NASAL SINUS SURGERY  yrs ago   ONE SINUS SURGERY THRU NOSE AND ANOTHER SINUS SURGERY THRU INCISION ABOVE RT EYE   TRANSTHORACIC ECHOCARDIOGRAM  05/21/2018   EF 60-65%, grd I DD, no valvular probs.   TRANSTHORACIC ECHOCARDIOGRAM  05/21/2018   EF 60-65%, normal wall motion, +DD, normal valves and RV fxn.   TRANSURETHRAL RESECTION OF BLADDER TUMOR N/A 04/22/2020   PATH: NONinvasive high grade uroepithelial malignancy. Procedure: TRANSURETHRAL RESECTION OF BLADDER TUMOR (TURBT); FULGERATION;  Surgeon: Remi Haggard, MD;  Location: [facility];  Service: Urology;  Laterality: N/A;    Outpatient Medications Prior to Visit  Medication Sig Dispense Refill   acetaminophen (TYLENOL) 500 MG tablet Take 500 mg by mouth as needed for mild pain or moderate pain (Sinus).     aspirin EC 81 MG EC tablet Take 1 tablet (81 mg total) by mouth daily.     ezetimibe (ZETIA) 10 MG tablet Take 1 tablet (10  mg total) by mouth daily. 90 tablet 3   metFORMIN (GLUCOPHAGE) 500 MG tablet Take 1 tablet (500 mg total) by mouth 2 (two) times daily with a meal.     vitamin B-12 (CYANOCOBALAMIN) 1000 MCG tablet Take 1,000 mcg by mouth every other day.     nitroGLYCERIN (NITROSTAT) 0.4 MG SL tablet PLACE 1 TABLET UNDER THE TONGUE EVERY 5 MINUTES X 3 DOSES AS NEEDED FOR CHEST PAIN. (Patient not taking: Reported on 12/17/2020) 25 tablet 6   atorvastatin (LIPITOR) 80 MG tablet TAKE 1 TABLET BY MOUTH DAILY AT 6 PM. 30 tablet 0   clopidogrel (PLAVIX) 75 MG tablet TAKE 1 TABLET BY MOUTH DAILY WITH BREAKFAST. 90 tablet 1   escitalopram (LEXAPRO) 20 MG tablet TAKE 1 TABLET BY MOUTH EVERY DAY 30 tablet 0   furosemide (LASIX) 40 MG tablet TAKE 1 TABLET (40 MG TOTAL) BY MOUTH DAILY. TAKE EVERY DAY EXCEPT: SUNDAY 90 tablet  1   gabapentin (NEURONTIN) 300 MG capsule TAKE 1 CAPSULE BY MOUTH TWICE A DAY 60 capsule 0   lisinopril (ZESTRIL) 40 MG tablet TAKE 1/2 TABLET (20 MG TOTAL) BY MOUTH DAILY. 45 tablet 3   metoprolol succinate (TOPROL-XL) 100 MG 24 hr tablet TAKE 1 TABLET BY MOUTH IN THE MORNING AND AT BEDTIME. TAKE WITH OR IMMEDIATELY FOLLOWING A MEAL. 30 tablet 0   No facility-administered medications prior to visit.    No Known Allergies  Review of Systems As per HPI  PE:    03/31/2022    9:14 AM 12/30/2021   10:05 AM 09/30/2021   10:09 AM  Vitals with BMI  Height '5\' 7"'$  '5\' 7"'$    Weight 232 lbs 233 lbs 3 oz   BMI 83.38 25.05   Systolic 397 673 419  Diastolic 69 78 87  Pulse 58 56      Physical Exam  Gen: Alert, well appearing.  Patient is oriented to person, place, time, and situation. AFFECT: pleasant, lucid thought and speech. CV: RRR, no m/r/g.   LUNGS: CTA bilat, nonlabored resps, good aeration in all lung fields. EXT: no clubbing or cyanosis.  no edema.   LABS:  Last CBC Lab Results  Component Value Date   WBC 8.7 07/01/2021   HGB 14.0 07/01/2021   HCT 41.6 07/01/2021   MCV 97.0  07/01/2021   MCH 31.3 05/22/2018   RDW 13.4 07/01/2021   PLT 189.0 37/90/2409   Last metabolic panel Lab Results  Component Value Date   GLUCOSE 119 (H) 12/30/2021   NA 142 12/30/2021   K 6.0 Hemolysis seen.. (H) 12/30/2021   CL 105 12/30/2021   CO2 26 12/30/2021   BUN 12 12/30/2021   CREATININE 0.90 12/30/2021   GFRNONAA 74 08/18/2018   CALCIUM 9.4 12/30/2021   PROT 6.3 12/30/2021   ALBUMIN 4.3 12/30/2021   BILITOT 1.6 (H) 12/30/2021   ALKPHOS 47 12/30/2021   AST 20 12/30/2021   ALT 7 12/30/2021   ANIONGAP 8 05/22/2018   Last lipids Lab Results  Component Value Date   CHOL 125 12/30/2021   HDL 44.60 12/30/2021   LDLCALC 53 12/30/2021   LDLDIRECT 115.0 03/11/2021   TRIG 134.0 12/30/2021   CHOLHDL 3 12/30/2021   Last hemoglobin A1c Lab Results  Component Value Date   HGBA1C 6.3 (A) 03/31/2022   HGBA1C 6.3 03/31/2022   HGBA1C 6.3 03/31/2022   HGBA1C 6.3 03/31/2022   Last thyroid functions Lab Results  Component Value Date   TSH 1.54 08/24/2019   IMPRESSION AND PLAN:  1 diabetes with nephropathy. Good control on metformin 500 mg twice daily. POC Hba1c today is 6.3%.   #2 hypertension, doing well on lisinopril 20 mg a day and Toprol-XL 100 mg twice daily. Electrolytes and creatinine today.  #3 chronic renal insufficiency stage II.   GFR has been much better over the last 6 months (70-80s). Basic metabolic panel today. Of note, he does take Lasix 40 mg daily.  #4 hypercholesterolemia. He is on Zetia 10 mg a day and a atorvastatin 80 mg a day. Last LDL in Oct this year was 25. Next lipid panel 3 months  An After Visit Summary was printed and given to the patient.  FOLLOW UP: Return in about 3 months (around 06/30/2022) for annual CPE (fasting).  Signed:  Crissie Sickles, MD           03/31/2022

## 2022-04-01 ENCOUNTER — Telehealth: Payer: Self-pay

## 2022-04-01 NOTE — Telephone Encounter (Signed)
Patient returning call about lab results.   Patient is aware all labs are normal, no new recommendations per Dr. Idelle Leech notes.  Patient does not have any questions or concerns at this time.

## 2022-04-01 NOTE — Telephone Encounter (Signed)
Noted  

## 2022-06-07 ENCOUNTER — Telehealth: Payer: Self-pay | Admitting: Family Medicine

## 2022-06-07 NOTE — Telephone Encounter (Signed)
Letter/communications only.

## 2022-06-14 DIAGNOSIS — D123 Benign neoplasm of transverse colon: Secondary | ICD-10-CM | POA: Diagnosis not present

## 2022-06-14 DIAGNOSIS — Z09 Encounter for follow-up examination after completed treatment for conditions other than malignant neoplasm: Secondary | ICD-10-CM | POA: Diagnosis not present

## 2022-06-14 DIAGNOSIS — Z86010 Personal history of colonic polyps: Secondary | ICD-10-CM | POA: Diagnosis not present

## 2022-06-14 LAB — HM COLONOSCOPY

## 2022-06-28 NOTE — Patient Instructions (Incomplete)
It was very nice to see you today!   PLEASE NOTE:   If you had any lab tests please let us know if you have not heard back within a few days. You may see your results on MyChart before we have a chance to review them but we will give you a call once they are reviewed by us. If we ordered any referrals today, please let us know if you have not heard from their office within the next 2 weeks. You should receive a letter via MyChart confirming if the referral was approved and their office contact information to schedule. Health Maintenance, Male Adopting a healthy lifestyle and getting preventive care are important in promoting health and wellness. Ask your health care provider about: The right schedule for you to have regular tests and exams. Things you can do on your own to prevent diseases and keep yourself healthy. What should I know about diet, weight, and exercise? Eat a healthy diet  Eat a diet that includes plenty of vegetables, fruits, low-fat dairy products, and lean protein. Do not eat a lot of foods that are high in solid fats, added sugars, or sodium. Maintain a healthy weight Body mass index (BMI) is a measurement that can be used to identify possible weight problems. It estimates body fat based on height and weight. Your health care provider can help determine your BMI and help you achieve or maintain a healthy weight. Get regular exercise Get regular exercise. This is one of the most important things you can do for your health. Most adults should: Exercise for at least 150 minutes each week. The exercise should increase your heart rate and make you sweat (moderate-intensity exercise). Do strengthening exercises at least twice a week. This is in addition to the moderate-intensity exercise. Spend less time sitting. Even light physical activity can be beneficial. Watch cholesterol and blood lipids Have your blood tested for lipids and cholesterol at 76 years of age, then have this  test every 5 years. You may need to have your cholesterol levels checked more often if: Your lipid or cholesterol levels are high. You are older than 76 years of age. You are at high risk for heart disease. What should I know about cancer screening? Many types of cancers can be detected early and may often be prevented. Depending on your health history and family history, you may need to have cancer screening at various ages. This may include screening for: Colorectal cancer. Prostate cancer. Skin cancer. Lung cancer. What should I know about heart disease, diabetes, and high blood pressure? Blood pressure and heart disease High blood pressure causes heart disease and increases the risk of stroke. This is more likely to develop in people who have high blood pressure readings or are overweight. Talk with your health care provider about your target blood pressure readings. Have your blood pressure checked: Every 3-5 years if you are 18-39 years of age. Every year if you are 40 years old or older. If you are between the ages of 65 and 75 and are a current or former smoker, ask your health care provider if you should have a one-time screening for abdominal aortic aneurysm (AAA). Diabetes Have regular diabetes screenings. This checks your fasting blood sugar level. Have the screening done: Once every three years after age 45 if you are at a normal weight and have a low risk for diabetes. More often and at a younger age if you are overweight or have a high risk   for diabetes. What should I know about preventing infection? Hepatitis B If you have a higher risk for hepatitis B, you should be screened for this virus. Talk with your health care provider to find out if you are at risk for hepatitis B infection. Hepatitis C Blood testing is recommended for: Everyone born from 1945 through 1965. Anyone with known risk factors for hepatitis C. Sexually transmitted infections (STIs) You should be  screened each year for STIs, including gonorrhea and chlamydia, if: You are sexually active and are younger than 76 years of age. You are older than 76 years of age and your health care provider tells you that you are at risk for this type of infection. Your sexual activity has changed since you were last screened, and you are at increased risk for chlamydia or gonorrhea. Ask your health care provider if you are at risk. Ask your health care provider about whether you are at high risk for HIV. Your health care provider may recommend a prescription medicine to help prevent HIV infection. If you choose to take medicine to prevent HIV, you should first get tested for HIV. You should then be tested every 3 months for as long as you are taking the medicine. Follow these instructions at home: Alcohol use Do not drink alcohol if your health care provider tells you not to drink. If you drink alcohol: Limit how much you have to 0-2 drinks a day. Know how much alcohol is in your drink. In the [place] one drink equals one 12 oz bottle of beer (355 mL), one 5 oz glass of wine (148 mL), or one 1 oz glass of hard liquor (44 mL). Lifestyle Do not use any products that contain nicotine or tobacco. These products include cigarettes, chewing tobacco, and vaping devices, such as e-cigarettes. If you need help quitting, ask your health care provider. Do not use street drugs. Do not share needles. Ask your health care provider for help if you need support or information about quitting drugs. General instructions Schedule regular health, dental, and eye exams. Stay current with your vaccines. Tell your health care provider if: You often feel depressed. You have ever been abused or do not feel safe at home. Summary Adopting a healthy lifestyle and getting preventive care are important in promoting health and wellness. Follow your health care provider's instructions about healthy diet, exercising, and getting tested  or screened for diseases. Follow your health care provider's instructions on monitoring your cholesterol and blood pressure. This information is not intended to replace advice given to you by your health care provider. Make sure you discuss any questions you have with your health care provider. Document Revised: 07/14/2020 Document Reviewed: 07/14/2020 Elsevier Patient Education  2023 [employer]  

## 2022-06-30 ENCOUNTER — Encounter: Payer: Self-pay | Admitting: Family Medicine

## 2022-06-30 ENCOUNTER — Ambulatory Visit (INDEPENDENT_AMBULATORY_CARE_PROVIDER_SITE_OTHER): Payer: PPO | Admitting: Family Medicine

## 2022-06-30 VITALS — BP 124/80 | HR 57 | Wt 238.6 lb

## 2022-06-30 DIAGNOSIS — E7800 Pure hypercholesterolemia, unspecified: Secondary | ICD-10-CM | POA: Diagnosis not present

## 2022-06-30 DIAGNOSIS — Z0000 Encounter for general adult medical examination without abnormal findings: Secondary | ICD-10-CM

## 2022-06-30 DIAGNOSIS — N182 Chronic kidney disease, stage 2 (mild): Secondary | ICD-10-CM | POA: Diagnosis not present

## 2022-06-30 DIAGNOSIS — Z7984 Long term (current) use of oral hypoglycemic drugs: Secondary | ICD-10-CM

## 2022-06-30 DIAGNOSIS — I10 Essential (primary) hypertension: Secondary | ICD-10-CM | POA: Diagnosis not present

## 2022-06-30 DIAGNOSIS — E1121 Type 2 diabetes mellitus with diabetic nephropathy: Secondary | ICD-10-CM

## 2022-06-30 LAB — CBC
HCT: 44 % (ref 39.0–52.0)
Hemoglobin: 14.7 g/dL (ref 13.0–17.0)
MCHC: 33.5 g/dL (ref 30.0–36.0)
MCV: 98.2 fl (ref 78.0–100.0)
Platelets: 201 10*3/uL (ref 150.0–400.0)
RBC: 4.48 Mil/uL (ref 4.22–5.81)
RDW: 13.6 % (ref 11.5–15.5)
WBC: 7.8 10*3/uL (ref 4.0–10.5)

## 2022-06-30 LAB — LIPID PANEL
Cholesterol: 148 mg/dL (ref 0–200)
HDL: 49.7 mg/dL (ref >39.00)
LDL Cholesterol: 66 mg/dL (ref 0–99)
NonHDL: 98.58
Total CHOL/HDL Ratio: 3
Triglycerides: 162 mg/dL — ABNORMAL HIGH (ref 0.0–149.0)
VLDL: 32.4 mg/dL (ref 0.0–40.0)

## 2022-06-30 LAB — COMPREHENSIVE METABOLIC PANEL
ALT: 10 U/L (ref 0–53)
AST: 13 U/L (ref 0–37)
Albumin: 4.1 g/dL (ref 3.5–5.2)
Alkaline Phosphatase: 59 U/L (ref 39–117)
BUN: 15 mg/dL (ref 6–23)
CO2: 30 mEq/L (ref 19–32)
Calcium: 9.3 mg/dL (ref 8.4–10.5)
Chloride: 103 mEq/L (ref 96–112)
Creatinine, Ser: 1.04 mg/dL (ref 0.40–1.50)
GFR: 70.28 mL/min (ref >60.00)
Glucose, Bld: 120 mg/dL — ABNORMAL HIGH (ref 70–99)
Potassium: 4.6 mEq/L (ref 3.5–5.1)
Sodium: 141 mEq/L (ref 135–145)
Total Bilirubin: 0.6 mg/dL (ref 0.2–1.2)
Total Protein: 6.1 g/dL (ref 6.0–8.3)

## 2022-06-30 LAB — POCT GLYCOSYLATED HEMOGLOBIN (HGB A1C)
HbA1c POC (<> result, manual entry): 6.2 % (ref 4.0–5.6)
HbA1c, POC (controlled diabetic range): 6.2 % (ref 0.0–7.0)
HbA1c, POC (prediabetic range): 6.2 % (ref 5.7–6.4)
Hemoglobin A1C: 6.2 % — AB (ref 4.0–5.6)

## 2022-06-30 NOTE — Progress Notes (Signed)
Office Note 06/30/2022  CC:  Chief Complaint  Patient presents with   Follow-up    Follow up. No other questions or concerns.   Patient is a 77 y.o. male who is here for annual health maintenance exam and 2-month follow-up diabetes, hypertension, hyperlipidemia, and chronic renal insufficiency stage II. A/P as of last visit: "1 diabetes with nephropathy. Good control on metformin 500 mg twice daily. POC Hba1c today is 6.3%.   #2 hypertension, doing well on lisinopril 20 mg a day and Toprol-XL 100 mg twice daily. Electrolytes and creatinine today.   #3 chronic renal insufficiency stage II.   GFR has been much better over the last 6 months (70-80s). Basic metabolic panel today. Of note, he does take Lasix 40 mg daily.   #4 hypercholesterolemia. He is on Zetia 10 mg a day and a atorvastatin 80 mg a day. Last LDL in Oct this year was 37. Next lipid panel 3 months"  INTERIM HX: Feeling well.  He keeps active.  He does things like split wood and mowing his yard.  Past Medical History:  Diagnosis Date   Chronic renal insufficiency, stage III (moderate)    Coronary artery disease    a. DES to LAD 04/2011 - took Effient/ASA x 1 yr, now on ASA only. b. Cath 10/2012: patent stent, otherwise normal cors. c. 05/2018 Prox LAD stent 75% restenosed, DES placed.  08/30/19 cath: patent stent, mild distal LAD dz not requiring intervention, LVEF nl, EDP normal: maximize antianginal meds.   COVID 04/04/2020   asymptomatic   Diabetes mellitus with complication 05/2018   A1c 6.9%   GERD (gastroesophageal reflux disease)    HAS HAD ESOPHAGUS STRETCHED SEVERAL TIMES IN THE PAST   History of bladder cancer 04/2020   TURBT 04/2020.  Surveillance cysto clear as of 07/2021   Hyperlipidemia    goal LDL < 70   Hypertension    Myocardial infarction 04/21/2016   OSA on CPAP 10/18/2011   doesn't wear it   Poor historian    talk to dtr pam hooker cell [phone]   Right knee pain    RIGHT KNEE  MEDIAL MENSICAL TEAR    Past Surgical History:  Procedure Laterality Date   CARDIAC CATHETERIZATION  06/2017   In-stent restenosis cleared out   CARDIOVASCULAR STRESS TEST     Latter part of 2019--normal per cardiologist's note from 10/2017   CERVICAL FUSION  1990 and 1993   X 2   SURGERIES    SLIGHT LIMITATION ROM   COLONOSCOPY  11/24/2004; 2018   NORMAL.  Recall 2016.  Repeat 2018->tubular adenoma x 1 (Dr. Caryl Never, Dig [facility]a spec).   CORONARY ANGIOPLASTY WITH STENT PLACEMENT     CORONARY STENT INTERVENTION N/A 05/22/2018   Prox LAD for in stent restenosis.  DAPT x 1 yr.  Procedure: CORONARY STENT INTERVENTION;  Surgeon: Corky Crafts, MD;  Location: [facility] INVASIVE CV LAB;  Service: Cardiovascular;  Laterality: N/A;   CORONARY ULTRASOUND/IVUS N/A 05/22/2018   Procedure: Intravascular Ultrasound/IVUS;  Surgeon: Corky Crafts, MD;  Location: [facility] INVASIVE CV LAB;  Service: Cardiovascular;  Laterality: N/A;   CYSTOSCOPY W/ RETROGRADES Bilateral 04/22/2020   Procedure: CYSTOSCOPY WITH RETROGRADE PYELOGRAM;  Surgeon: Belva Agee, MD;  Location: [facility];  Service: Urology;  Laterality: Bilateral;  30 MINS   ESOPHAGOGASTRODUODENOSCOPY  05/2016   mild reactive changes   KNEE ARTHROSCOPY  10/22/2011   Procedure: ARTHROSCOPY KNEE;  Surgeon: Loanne Drilling, MD;  Location: [facility];  Service: Orthopedics;  Laterality: Right;  Right knee scope with debridement   LAPAROSCOPIC CHOLECYSTECTOMY  2018   LEFT HEART CATH AND CORONARY ANGIOGRAPHY N/A 05/22/2018   DES placed for in stent restenosis.  EF 55-60%.  Procedure: LEFT HEART CATH AND CORONARY ANGIOGRAPHY;  Surgeon: Corky Crafts, MD;  Location: [facility] INVASIVE CV LAB;  Service: Cardiovascular;  Laterality: N/A;   LEFT HEART CATH AND CORONARY ANGIOGRAPHY N/A 08/30/2019   No change from 2020 cath->patent LAD stent, mild distal LAD dz.  No intervention required. Nitrates + possible future CCB antianginal for poss microvasc  dz. Procedure: LEFT HEART CATH AND CORONARY ANGIOGRAPHY;  Surgeon: Corky Crafts, MD;  Location: [facility] INVASIVE CV LAB;  Service: Cardiovascular;  Laterality: N/A;   LEFT HEART CATHETERIZATION WITH CORONARY ANGIOGRAM N/A 10/08/2012   Procedure: LEFT HEART CATHETERIZATION WITH CORONARY ANGIOGRAM;  Surgeon: Runell Gess, MD;  Location: [facility] CATH LAB;  Service: Cardiovascular;  Laterality: N/A;   NASAL SINUS SURGERY  yrs ago   ONE SINUS SURGERY THRU NOSE AND ANOTHER SINUS SURGERY THRU INCISION ABOVE RT EYE   TRANSTHORACIC ECHOCARDIOGRAM  05/21/2018   EF 60-65%, grd I DD, no valvular probs.   TRANSTHORACIC ECHOCARDIOGRAM  05/21/2018   EF 60-65%, normal wall motion, +DD, normal valves and RV fxn.   TRANSURETHRAL RESECTION OF BLADDER TUMOR N/A 04/22/2020   PATH: NONinvasive high grade uroepithelial malignancy. Procedure: TRANSURETHRAL RESECTION OF BLADDER TUMOR (TURBT); FULGERATION;  Surgeon: Belva Agee, MD;  Location: [facility];  Service: Urology;  Laterality: N/A;    Family History  Problem Relation Age of Onset   Arthritis Mother    Cancer Father     Social History   Socioeconomic History   Marital status: Widowed    Spouse name: Talbert Forest   Number of children: Not on file   Years of education: Not on file   Highest education level: Not on file  Occupational History   Not on file  Tobacco Use   Smoking status: Never   Smokeless tobacco: Former    Types: Associate Professor Use: Never used  Substance and Sexual Activity   Alcohol use: No   Drug use: No   Sexual activity: Not on file  Other Topics Concern   Not on file  Social History Narrative   Married, 2 daughters.  2 other daughters deceased.   Educ: HS   Occup: farmer, retired from [employer], Education officer, environmental at [employer].   No T/A/Ds   Social Determinants of Health   Financial Resource Strain: Patient Declined (08/29/2021)   Overall Financial Resource Strain (CARDIA)    Difficulty of  Paying Living Expenses: Patient declined  Food Insecurity: No Food Insecurity (08/29/2021)   Hunger Vital Sign    Worried About Running Out of Food in the Last Year: Never true    Ran Out of Food in the Last Year: Never true  Transportation Needs: No Transportation Needs (08/29/2021)   PRAPARE - Administrator, Civil Service (Medical): No    Lack of Transportation (Non-Medical): No  Physical Activity: Not on file  Stress: Patient Declined (08/29/2021)   [employer] of Occupational Health - Occupational Stress Questionnaire    Feeling of Stress : Patient declined  Social Connections: Unknown (08/29/2021)   Social Connection and Isolation Panel [NHANES]    Frequency of Communication with Friends and Family: More than three times a week    Frequency of Social Gatherings  with Friends and Family: Twice a week    Attends Religious Services: More than 4 times per year    Active Member of Clubs or Organizations: Not on file    Attends Banker Meetings: Not on file    Marital Status: Widowed  Intimate Partner Violence: Not At Risk (08/29/2021)   Humiliation, Afraid, Rape, and Kick questionnaire    Fear of Current or Ex-Partner: No    Emotionally Abused: No    Physically Abused: No    Sexually Abused: No    Outpatient Medications Prior to Visit  Medication Sig Dispense Refill   acetaminophen (TYLENOL) 500 MG tablet Take 500 mg by mouth as needed for mild pain or moderate pain (Sinus).     aspirin EC 81 MG EC tablet Take 1 tablet (81 mg total) by mouth daily.     atorvastatin (LIPITOR) 80 MG tablet Take 1 tablet (80 mg total) by mouth daily. TAKE 1 TABLET BY MOUTH DAILY AT 6 PM. 90 tablet 1   clopidogrel (PLAVIX) 75 MG tablet Take 1 tablet (75 mg total) by mouth daily with breakfast. 90 tablet 1   escitalopram (LEXAPRO) 20 MG tablet Take 1 tablet (20 mg total) by mouth daily. 90 tablet 1   ezetimibe (ZETIA) 10 MG tablet Take 1 tablet (10 mg total) by mouth daily. 90  tablet 3   furosemide (LASIX) 40 MG tablet Take 1 tablet (40 mg total) by mouth daily. Take every  day except: Sunday 90 tablet 1   gabapentin (NEURONTIN) 300 MG capsule Take 1 capsule (300 mg total) by mouth 2 (two) times daily. 180 capsule 1   lisinopril (ZESTRIL) 40 MG tablet TAKE 1/2 TABLET (20 MG TOTAL) BY MOUTH DAILY. 45 tablet 1   metFORMIN (GLUCOPHAGE) 500 MG tablet Take 1 tablet (500 mg total) by mouth 2 (two) times daily with a meal.     metoprolol succinate (TOPROL-XL) 100 MG 24 hr tablet Take with or immediately following a meal. 90 tablet 1   vitamin B-12 (CYANOCOBALAMIN) 1000 MCG tablet Take 1,000 mcg by mouth every other day.     nitroGLYCERIN (NITROSTAT) 0.4 MG SL tablet PLACE 1 TABLET UNDER THE TONGUE EVERY 5 MINUTES X 3 DOSES AS NEEDED FOR CHEST PAIN. (Patient not taking: Reported on 12/17/2020) 25 tablet 6   No facility-administered medications prior to visit.    No Known Allergies  Review of Systems  Constitutional:  Negative for appetite change, chills, fatigue and fever.  HENT:  Negative for congestion, dental problem, ear pain and sore throat.   Eyes:  Negative for discharge, redness and visual disturbance.  Respiratory:  Negative for cough, chest tightness, shortness of breath and wheezing.   Cardiovascular:  Negative for chest pain, palpitations and leg swelling.  Gastrointestinal:  Negative for abdominal pain, blood in stool, diarrhea, nausea and vomiting.  Genitourinary:  Negative for difficulty urinating, dysuria, flank pain, frequency, hematuria and urgency.  Musculoskeletal:  Negative for arthralgias, back pain, joint swelling, myalgias and neck stiffness.  Skin:  Negative for pallor and rash.  Neurological:  Negative for dizziness, speech difficulty, weakness and headaches.  Hematological:  Negative for adenopathy. Does not bruise/bleed easily.  Psychiatric/Behavioral:  Negative for confusion and sleep disturbance. The patient is not nervous/anxious.      PE;    06/30/2022    9:22 AM 03/31/2022    9:14 AM 12/30/2021   10:05 AM  Vitals with BMI  Height  5\' 7"  5\' 7"   Weight 238 lbs  10 oz 232 lbs 233 lbs 3 oz  BMI  36.33 36.52  Systolic 124 110 161  Diastolic 80 69 78  Pulse 57 58 56    Gen: Alert, well appearing.  Patient is oriented to person, place, time, and situation. AFFECT: pleasant, lucid thought and speech. ENT: Ears: EACs clear, normal epithelium.  TMs with good light reflex and landmarks bilaterally.  Eyes: no injection, icteris, swelling, or exudate.  EOMI, PERRLA. Nose: no drainage or turbinate edema/swelling.  No injection or focal lesion.  Mouth: lips without lesion/swelling.  Oral mucosa pink and moist.  Dentition intact and without obvious caries or gingival swelling.  Oropharynx without erythema, exudate, or swelling.  Neck: supple/nontender.  No LAD, mass, or TM.  Carotid pulses 2+ bilaterally, without bruits. CV: RRR, no m/r/g.   LUNGS: CTA bilat, nonlabored resps, good aeration in all lung fields. ABD: soft, NT, ND, BS normal.  No hepatospenomegaly or mass.  No bruits. EXT: no clubbing, cyanosis, or edema.  Musculoskeletal: no joint swelling, erythema, warmth, or tenderness.  ROM of all joints intact. Skin - no sores or suspicious lesions or rashes or color changes  Pertinent labs:  Lab Results  Component Value Date   TSH 1.54 08/24/2019   Lab Results  Component Value Date   WBC 8.7 07/01/2021   HGB 14.0 07/01/2021   HCT 41.6 07/01/2021   MCV 97.0 07/01/2021   PLT 189.0 07/01/2021   Lab Results  Component Value Date   CREATININE 1.13 03/31/2022   BUN 15 03/31/2022   NA 141 03/31/2022   K 4.4 03/31/2022   CL 104 03/31/2022   CO2 31 03/31/2022   Lab Results  Component Value Date   ALT 7 12/30/2021   AST 20 12/30/2021   ALKPHOS 47 12/30/2021   BILITOT 1.6 (H) 12/30/2021   Lab Results  Component Value Date   CHOL 125 12/30/2021   Lab Results  Component Value Date   HDL 44.60 12/30/2021    Lab Results  Component Value Date   LDLCALC 53 12/30/2021   Lab Results  Component Value Date   TRIG 134.0 12/30/2021   Lab Results  Component Value Date   CHOLHDL 3 12/30/2021   Lab Results  Component Value Date   PSA 1.00 02/20/2020   Lab Results  Component Value Date   HGBA1C 6.2 (A) 06/30/2022   HGBA1C 6.2 06/30/2022   HGBA1C 6.2 06/30/2022   HGBA1C 6.2 06/30/2022   ASSESSMENT AND PLAN:   #1 health maintenance exam: Reviewed age and gender appropriate health maintenance issues (prudent diet, regular exercise, health risks of tobacco and excessive alcohol, use of seatbelts, fire alarms in home, use of sunscreen).  Also reviewed age and gender appropriate health screening as well as vaccine recommendations. Vaccines: Pt declines flu, covid, and Tdap vaccines.  Shingrix->he declines.  Prevnar 13->declines. Labs: cbc,cmet,lipids,Hba1c, urine microalb/cr. Prostate ca screening: Through shared decision making process patient made decision to discontinue any prostate cancer screening. Colon ca screening: colonoscopy x 2 normal, most recent was earlier this month, 1 small polyp found, no further colonoscopies recommended by GI.   1 diabetes with nephropathy. Good control on metformin 500 mg twice daily. POC Hba1c today is 6.2%.   #2 hypertension, doing well on lisinopril 20 mg a day and Toprol-XL 100 mg twice daily. Electrolytes and creatinine today.   #3 chronic renal insufficiency stage II.   Basic metabolic panel today. Of note, he does take Lasix 40 mg daily.   #4 hypercholesterolemia.  He is on Zetia 10 mg a day and a atorvastatin 80 mg a day. Last LDL about 6 months ago was 53. Lipid panel and hepatic panel today.  An After Visit Summary was printed and given to the patient.  FOLLOW UP:  Return in about 3 months (around 09/29/2022) for routine chronic illness f/u.  Signed:  Santiago Bumpers, MD           06/30/2022

## 2022-07-01 ENCOUNTER — Encounter: Payer: Self-pay | Admitting: Family Medicine

## 2022-07-01 ENCOUNTER — Telehealth: Payer: Self-pay | Admitting: Family Medicine

## 2022-07-01 NOTE — Telephone Encounter (Signed)
Contacted Richard [name] to schedule their annual wellness visit. Appointment made for 07/07/2022.  Gabriel Cirri [facility] AW[facility] TEAM Direct Dial [phone]

## 2022-07-07 ENCOUNTER — Ambulatory Visit (INDEPENDENT_AMBULATORY_CARE_PROVIDER_SITE_OTHER): Payer: PPO

## 2022-07-07 VITALS — Wt 225.0 lb

## 2022-07-07 DIAGNOSIS — Z0000 Encounter for general adult medical examination without abnormal findings: Secondary | ICD-10-CM | POA: Diagnosis not present

## 2022-07-07 NOTE — Progress Notes (Addendum)
I connected with  [name] on 07/12/22 by a audio enabled telemedicine application and verified that I am speaking with the correct person using two identifiers.  Patient Location: Home  Provider Location: Home Office  I discussed the limitations of evaluation and management by telemedicine. The patient expressed understanding and agreed to proceed.   Subjective:   Richard [name] is a 77 y.o. male who presents for Medicare Annual/Subsequent preventive examination.  Review of Systems     Cardiac Risk Factors include: advanced age (>85men, >51 women);hypertension;dyslipidemia;male gender;obesity (BMI >30kg/m2)     Objective:    Today's Vitals   07/07/22 1533  Weight: 225 lb (102.1 kg)   Body mass index is 35.24 kg/m.     07/07/2022    3:38 PM 08/29/2021    9:54 AM 04/22/2020    8:00 AM 08/30/2019    6:09 AM 06/28/2018   12:03 PM 05/20/2018    6:00 AM 05/19/2018    3:34 PM  Advanced Directives  Does Patient Have a Medical Advance Directive? Yes No No Yes No No No  Type of Estate agent of [place];Living will   Healthcare Power of [place];Living will     Does patient want to make changes to medical advance directive?   Yes (MAU/Ambulatory/Procedural Areas - Information given)      Copy of Healthcare Power of Attorney in Chart? No - copy requested        Would patient like information on creating a medical advance directive?  No - Patient declined   No - Patient declined No - Patient declined     Current Medications (verified) Outpatient Encounter Medications as of 07/07/2022  Medication Sig   acetaminophen (TYLENOL) 500 MG tablet Take 500 mg by mouth as needed for mild pain or moderate pain (Sinus).   aspirin EC 81 MG EC tablet Take 1 tablet (81 mg total) by mouth daily.   atorvastatin (LIPITOR) 80 MG tablet Take 1 tablet (80 mg total) by mouth daily. TAKE 1 TABLET BY MOUTH DAILY AT 6 PM.   clopidogrel (PLAVIX) 75 MG tablet Take 1 tablet (75 mg total) by  mouth daily with breakfast.   escitalopram (LEXAPRO) 20 MG tablet Take 1 tablet (20 mg total) by mouth daily.   ezetimibe (ZETIA) 10 MG tablet Take 1 tablet (10 mg total) by mouth daily.   furosemide (LASIX) 40 MG tablet Take 1 tablet (40 mg total) by mouth daily. Take every  day except: Sunday   gabapentin (NEURONTIN) 300 MG capsule Take 1 capsule (300 mg total) by mouth 2 (two) times daily.   lisinopril (ZESTRIL) 40 MG tablet TAKE 1/2 TABLET (20 MG TOTAL) BY MOUTH DAILY.   metFORMIN (GLUCOPHAGE) 500 MG tablet Take 1 tablet (500 mg total) by mouth 2 (two) times daily with a meal.   metoprolol succinate (TOPROL-XL) 100 MG 24 hr tablet Take with or immediately following a meal.   vitamin B-12 (CYANOCOBALAMIN) 1000 MCG tablet Take 1,000 mcg by mouth every other day.   nitroGLYCERIN (NITROSTAT) 0.4 MG SL tablet PLACE 1 TABLET UNDER THE TONGUE EVERY 5 MINUTES X 3 DOSES AS NEEDED FOR CHEST PAIN. (Patient not taking: Reported on 12/17/2020)   No facility-administered encounter medications on file as of 07/07/2022.    Allergies (verified) Patient has no known allergies.   History: Past Medical History:  Diagnosis Date   Chronic renal insufficiency, stage III (moderate) (HCC)    Coronary artery disease    a. DES to LAD  04/2011 - took Effient/ASA x 1 yr, now on ASA only. b. Cath 10/2012: patent stent, otherwise normal cors. c. 05/2018 Prox LAD stent 75% restenosed, DES placed.  08/30/19 cath: patent stent, mild distal LAD dz not requiring intervention, LVEF nl, EDP normal: maximize antianginal meds.   COVID 04/04/2020   asymptomatic   Diabetes mellitus with complication (HCC) 05/2018   A1c 6.9%   GERD (gastroesophageal reflux disease)    HAS HAD ESOPHAGUS STRETCHED SEVERAL TIMES IN THE PAST   History of bladder cancer 04/2020   TURBT 04/2020.  Surveillance cysto clear as of 07/2021   Hyperlipidemia    goal LDL < 70   Hypertension    Myocardial infarction (HCC) 04/21/2016   OSA on CPAP 10/18/2011    doesn't wear it   Poor historian    talk to dtr pam hooker cell [phone]   Right knee pain    RIGHT KNEE MEDIAL MENSICAL TEAR   Past Surgical History:  Procedure Laterality Date   CARDIAC CATHETERIZATION  06/2017   In-stent restenosis cleared out   CARDIOVASCULAR STRESS TEST     Latter part of 2019--normal per cardiologist's note from 10/2017   CERVICAL FUSION  1990 and 1993   X 2   SURGERIES    SLIGHT LIMITATION ROM   COLONOSCOPY  11/24/2004; 2018   NORMAL.  Recall 2016.  Repeat 2018->tubular adenoma x 1 (Dr. Caryl Never, Dig hea spec).  06/2022 polyp x 1   CORONARY ANGIOPLASTY WITH STENT PLACEMENT     CORONARY STENT INTERVENTION N/A 05/22/2018   Prox LAD for in stent restenosis.  DAPT x 1 yr.  Procedure: CORONARY STENT INTERVENTION;  Surgeon: Corky Crafts, MD;  Location: [facility] INVASIVE CV LAB;  Service: Cardiovascular;  Laterality: N/A;   CORONARY ULTRASOUND/IVUS N/A 05/22/2018   Procedure: Intravascular Ultrasound/IVUS;  Surgeon: Corky Crafts, MD;  Location: [facility] INVASIVE CV LAB;  Service: Cardiovascular;  Laterality: N/A;   CYSTOSCOPY W/ RETROGRADES Bilateral 04/22/2020   Procedure: CYSTOSCOPY WITH RETROGRADE PYELOGRAM;  Surgeon: Belva Agee, MD;  Location: [facility];  Service: Urology;  Laterality: Bilateral;  30 MINS   ESOPHAGOGASTRODUODENOSCOPY  05/2016   mild reactive changes   KNEE ARTHROSCOPY  10/22/2011   Procedure: ARTHROSCOPY KNEE;  Surgeon: Loanne Drilling, MD;  Location: [facility];  Service: Orthopedics;  Laterality: Right;  Right knee scope with debridement   LAPAROSCOPIC CHOLECYSTECTOMY  2018   LEFT HEART CATH AND CORONARY ANGIOGRAPHY N/A 05/22/2018   DES placed for in stent restenosis.  EF 55-60%.  Procedure: LEFT HEART CATH AND CORONARY ANGIOGRAPHY;  Surgeon: Corky Crafts, MD;  Location: [facility] INVASIVE CV LAB;  Service: Cardiovascular;  Laterality: N/A;   LEFT HEART CATH AND CORONARY ANGIOGRAPHY N/A 08/30/2019   No change from 2020  cath->patent LAD stent, mild distal LAD dz.  No intervention required. Nitrates + possible future CCB antianginal for poss microvasc dz. Procedure: LEFT HEART CATH AND CORONARY ANGIOGRAPHY;  Surgeon: Corky Crafts, MD;  Location: [facility] INVASIVE CV LAB;  Service: Cardiovascular;  Laterality: N/A;   LEFT HEART CATHETERIZATION WITH CORONARY ANGIOGRAM N/A 10/08/2012   Procedure: LEFT HEART CATHETERIZATION WITH CORONARY ANGIOGRAM;  Surgeon: Runell Gess, MD;  Location: [facility] CATH LAB;  Service: Cardiovascular;  Laterality: N/A;   NASAL SINUS SURGERY  yrs ago   ONE SINUS SURGERY THRU NOSE AND ANOTHER SINUS SURGERY THRU INCISION ABOVE RT EYE   TRANSTHORACIC ECHOCARDIOGRAM  05/21/2018   EF 60-65%, grd I DD, no  valvular probs.   TRANSTHORACIC ECHOCARDIOGRAM  05/21/2018   EF 60-65%, normal wall motion, +DD, normal valves and RV fxn.   TRANSURETHRAL RESECTION OF BLADDER TUMOR N/A 04/22/2020   PATH: NONinvasive high grade uroepithelial malignancy. Procedure: TRANSURETHRAL RESECTION OF BLADDER TUMOR (TURBT); FULGERATION;  Surgeon: Belva Agee, MD;  Location: [facility];  Service: Urology;  Laterality: N/A;   Family History  Problem Relation Age of Onset   Arthritis Mother    Cancer Father    Social History   Socioeconomic History   Marital status: Married    Spouse name: Talbert Forest   Number of children: Not on file   Years of education: Not on file   Highest education level: Not on file  Occupational History   Not on file  Tobacco Use   Smoking status: Never   Smokeless tobacco: Former    Types: Associate Professor Use: Never used  Substance and Sexual Activity   Alcohol use: No   Drug use: No   Sexual activity: Not on file  Other Topics Concern   Not on file  Social History Narrative   Married, 2 daughters.  2 other daughters deceased.   Educ: HS   Occup: farmer, retired from [employer], Education officer, environmental at [employer].   No T/A/Ds   Social Determinants of  Health   Financial Resource Strain: Low Risk  (07/07/2022)   Overall Financial Resource Strain (CARDIA)    Difficulty of Paying Living Expenses: Not hard at all  Food Insecurity: No Food Insecurity (07/07/2022)   Hunger Vital Sign    Worried About Running Out of Food in the Last Year: Never true    Ran Out of Food in the Last Year: Never true  Transportation Needs: No Transportation Needs (07/07/2022)   PRAPARE - Administrator, Civil Service (Medical): No    Lack of Transportation (Non-Medical): No  Physical Activity: Inactive (07/07/2022)   Exercise Vital Sign    Days of Exercise per Week: 0 days    Minutes of Exercise per Session: 0 min  Stress: No Stress Concern Present (07/07/2022)   [employer] of Occupational Health - Occupational Stress Questionnaire    Feeling of Stress : Not at all  Social Connections: Moderately Isolated (07/07/2022)   Social Connection and Isolation Panel [NHANES]    Frequency of Communication with Friends and Family: More than three times a week    Frequency of Social Gatherings with Friends and Family: More than three times a week    Attends Religious Services: More than 4 times per year    Active Member of [employer] or Organizations: No    Attends Banker Meetings: Never    Marital Status: Widowed    Tobacco Counseling Counseling given: Not Answered   Clinical Intake:  Pre-visit preparation completed: Yes  Pain : No/denies pain     Diabetic Nutrition Risk Assessment:  Has the patient had any N/V/D within the last 2 months?  No  Does the patient have any non-healing wounds?  No  Has the patient had any unintentional weight loss or weight gain?  No   Diabetes:  Is the patient diabetic?  Yes  If diabetic, was a CBG obtained today?  No  Did the patient bring in their glucometer from home?  No  How often do you monitor your CBG's? N/a.   Financial Strains and Diabetes Management:  Are you having any financial strains  with the device, your  supplies or your medication? No .  Does the patient want to be seen by Chronic Care Management for management of their diabetes?  No  Would the patient like to be referred to a Nutritionist or for Diabetic Management?  No   Diabetic Exams:  Diabetic Eye Exam: Completed 02/03/22 Diabetic Foot Exam: Completed 06/30/22   How often do you need to have someone help you when you read instructions, pamphlets, or other written materials from your doctor or pharmacy?: 1 - Never   Interpreter Needed?: No  Information entered by :: Lanier Ensign, LPN   Activities of Daily Living    07/07/2022    3:39 PM 08/29/2021    9:33 AM  In your present state of health, do you have any difficulty performing the following activities:  Hearing? 0 0  Vision? 0 0  Difficulty concentrating or making decisions? 0 0  Walking or climbing stairs? 0 0  Dressing or bathing? 0 0  Doing errands, shopping? 0 0  Preparing Food and eating ? N N  Using the Toilet? N N  In the past six months, have you accidently leaked urine? N N  Do you have problems with loss of bowel control? N N  Managing your Medications? N N  Managing your Finances? N N  Housekeeping or managing your Housekeeping? N N    Patient Care Team: Jeoffrey Massed, MD as PCP - General (Family Medicine) Corky Crafts, MD as PCP - Cardiology (Cardiology) Alba Cory as Attending Physician (Family Medicine) Elana Alm, MD as Attending Physician (Cardiology) Concepcion Elk, MD as Consulting Physician (Gastroenterology) Belva Agee, MD as Consulting Physician (Urology)  Indicate any recent Medical Services you may have received from other than [facility] providers in the past year (date may be approximate).     Assessment:   This is a routine wellness examination for [place].  Hearing/Vision screen Hearing Screening - Comments:: Pt denies any hearing issues  Vision Screening - Comments:: Pt follows up  with Dr Shea Evans for annul eye exams   Dietary issues and exercise activities discussed: Current Exercise Habits: The patient does not participate in regular exercise at present   Goals Addressed             This Visit's Progress    Patient Stated       Stay healthy and alive        Depression Screen    07/07/2022    3:37 PM 06/30/2022    9:23 AM 03/31/2022    9:12 AM 12/30/2021   10:10 AM 08/29/2021    9:53 AM 01/21/2021   11:00 AM 12/03/2020    9:14 AM  PHQ 2/9 Scores  PHQ - 2 Score 0 0 0 0 0 1 0  PHQ- 9 Score 0 0 3        Fall Risk    07/07/2022    3:39 PM 06/30/2022    9:22 AM 12/30/2021   10:10 AM 08/29/2021    9:54 AM 01/21/2021   10:59 AM  Fall Risk   Falls in the past year? 0 0 0 0 0  Number falls in past yr: 0 0 0 0 0  Injury with Fall? 0 0 0 0 0  Risk for fall due to : Impaired vision No Fall Risks Impaired vision    Follow up Falls prevention discussed Falls evaluation completed Falls evaluation completed Falls evaluation completed Falls evaluation completed    FALL RISK PREVENTION PERTAINING TO THE HOME:  Any stairs in or around the home? Yes  If so, are there any without handrails? No  Home free of loose throw rugs in walkways, pet beds, electrical cords, etc? Yes  Adequate lighting in your home to reduce risk of falls? Yes   ASSISTIVE DEVICES UTILIZED TO PREVENT FALLS:  Life alert? No  Use of a cane, walker or w/c? No  Grab bars in the bathroom? Yes  Shower chair or bench in shower? Yes  Elevated toilet seat or a handicapped toilet? No   TIMED UP AND GO:  Was the test performed? No .   Cognitive Function:declined         08/29/2021    9:53 AM  6CIT Screen  What Year? 0 points  What month? 0 points  What time? 0 points  Count back from 20 0 points  Months in reverse 0 points  Repeat phrase 0 points  Total Score 0 points    Immunizations Immunization History  Administered Date(s) Administered   Pneumococcal Polysaccharide-23  11/15/2012   Zoster, Live 10/31/2012    TDAP status: Due, Education has been provided regarding the importance of this vaccine. Advised may receive this vaccine at local pharmacy or Health Dept. Aware to provide a copy of the vaccination record if obtained from local pharmacy or Health Dept. Verbalized acceptance and understanding.  Flu Vaccine status: Declined, Education has been provided regarding the importance of this vaccine but patient still declined. Advised may receive this vaccine at local pharmacy or Health Dept. Aware to provide a copy of the vaccination record if obtained from local pharmacy or Health Dept. Verbalized acceptance and understanding.  Pneumococcal vaccine status: Due, Education has been provided regarding the importance of this vaccine. Advised may receive this vaccine at local pharmacy or Health Dept. Aware to provide a copy of the vaccination record if obtained from local pharmacy or Health Dept. Verbalized acceptance and understanding.  Covid-19 vaccine status: Declined, Education has been provided regarding the importance of this vaccine but patient still declined. Advised may receive this vaccine at local pharmacy or Health Dept.or vaccine clinic. Aware to provide a copy of the vaccination record if obtained from local pharmacy or Health Dept. Verbalized acceptance and understanding.  Qualifies for Shingles Vaccine? No   Zostavax completed No   Shingrix Completed?: No.    Education has been provided regarding the importance of this vaccine. Patient has been advised to call insurance company to determine out of pocket expense if they have not yet received this vaccine. Advised may also receive vaccine at local pharmacy or Health Dept. Verbalized acceptance and understanding.  Screening Tests Health Maintenance  Topic Date Due   COVID-19 Vaccine (1) Never done   Zoster Vaccines- Shingrix (1 of 2) Never done   Pneumonia Vaccine 36+ Years old (2 of 2 - PCV) 11/15/2013    Diabetic kidney evaluation - Urine ACR  10/01/2022   INFLUENZA VACCINE  10/07/2022   HEMOGLOBIN A1C  12/30/2022   OPHTHALMOLOGY EXAM  02/04/2023   Diabetic kidney evaluation - eGFR measurement  06/30/2023   FOOT EXAM  06/30/2023   Medicare Annual Wellness (AWV)  07/07/2023   COLONOSCOPY (Pts 45-87yrs Insurance coverage will need to be confirmed)  06/13/2032   HPV VACCINES  Aged Out   DTaP/Tdap/Td  Discontinued   Hepatitis C Screening  Discontinued    Health Maintenance  Health Maintenance Due  Topic Date Due   COVID-19 Vaccine (1) Never done   Zoster Vaccines- Shingrix (1 of 2)  Never done   Pneumonia Vaccine 64+ Years old (2 of 2 - PCV) 11/15/2013    Colorectal cancer screening: Type of screening: Colonoscopy. Completed 06/14/22. Repeat every 10 years   Additional Screening:  Hepatitis C Screening: does not qualify  Vision Screening: Recommended annual ophthalmology exams for early detection of glaucoma and other disorders of the eye. Is the patient up to date with their annual eye exam?  Yes  Who is the provider or what is the name of the office in which the patient attends annual eye exams? Dr Shea Evans If pt is not established with a provider, would they like to be referred to a provider to establish care? No .   Dental Screening: Recommended annual dental exams for proper oral hygiene  Community Resource Referral / Chronic Care Management: CRR required this visit?  No   CCM required this visit?  No      Plan:     I have personally reviewed and noted the following in the patient's chart:   Medical and social history Use of alcohol, tobacco or illicit drugs  Current medications and supplements including opioid prescriptions. Patient is not currently taking opioid prescriptions. Functional ability and status Nutritional status Physical activity Advanced directives List of other physicians Hospitalizations, surgeries, and ER visits in previous 12  months Vitals Screenings to include cognitive, depression, and falls Referrals and appointments  In addition, I have reviewed and discussed with patient certain preventive protocols, quality metrics, and best practice recommendations. A written personalized care plan for preventive services as well as general preventive health recommendations were provided to patient.     Marzella Schlein, LPN   [id number]   Nurse Notes: Pt declined cognition, pt was knowledgable of questions asked

## 2022-07-07 NOTE — Patient Instructions (Signed)
Richard [name]. Langham , Thank you for taking time to come for your Medicare Wellness Visit. I appreciate your ongoing commitment to your health goals. Please review the following plan we discussed and let me know if I can assist you in the future.   These are the goals we discussed:  Goals      HEMOGLOBIN A1C < 7      Last Hemoglobin A1C 7.2% on 08/24/19 Plan to check blood sugars as directed with goals fasting or 1 1/2 hours after eating with goal of 80-130 fasting and 180 or less after meals Plan to follow a low carbohydrate, low salt diet, watch portion sizes and avoid sugar sweetened drinks     Patient Stated     Stay healthy and alive         This is a list of the screening recommended for you and due dates:  Health Maintenance  Topic Date Due   COVID-19 Vaccine (1) Never done   Zoster (Shingles) Vaccine (1 of 2) Never done   Pneumonia Vaccine (2 of 2 - PCV) 11/15/2013   Yearly kidney health urinalysis for diabetes  10/01/2022   Flu Shot  10/07/2022   Hemoglobin A1C  12/30/2022   Eye exam for diabetics  02/04/2023   Yearly kidney function blood test for diabetes  06/30/2023   Complete foot exam   06/30/2023   Medicare Annual Wellness Visit  07/07/2023   Colon Cancer Screening  06/13/2032   HPV Vaccine  Aged Out   DTaP/Tdap/Td vaccine  Discontinued   Hepatitis C Screening: USPSTF Recommendation to screen - Ages 13-79 yo.  Discontinued    Advanced directives: Please bring a copy of your health care power of attorney and living will to the office at your convenience.  Conditions/risks identified: stay healthy  Next appointment: Follow up in one year for your annual wellness visit.   Preventive Care 40-64 Years, Male Preventive care refers to lifestyle choices and visits with your health care provider that can promote health and wellness. What does preventive care include? A yearly physical exam. This is also called an annual well check. Dental exams once or twice a  year. Routine eye exams. Ask your health care provider how often you should have your eyes checked. Personal lifestyle choices, including: Daily care of your teeth and gums. Regular physical activity. Eating a healthy diet. Avoiding tobacco and drug use. Limiting alcohol use. Practicing safe sex. Taking low-dose aspirin daily starting at age 64. Taking vitamin and mineral supplements as recommended by your health care provider. What happens during an annual well check? The services and screenings done by your health care provider during your annual well check will depend on your age, overall health, lifestyle risk factors, and family history of disease. Counseling  Your health care provider may ask you questions about your: Alcohol use. Tobacco use. Drug use. Emotional well-being. Home and relationship well-being. Sexual activity. Eating habits. Work and work Astron[employer]. Method of birth control. Menstrual cycle. Pregnancy history. Screening  You may have the following tests or measurements: Height, weight, and BMI. Blood pressure. Lipid and cholesterol levels. These may be checked every 5 years, or more frequently if you are over 23 years old. Skin check. Lung cancer screening. You may have this screening every year starting at age 22 if you have a 30-pack-year history of smoking and currently smoke or have quit within the past 15 years. Fecal occult blood test (FOBT) of the stool. You may have this test every  year starting at age 69. Flexible sigmoidoscopy or colonoscopy. You may have a sigmoidoscopy every 5 years or a colonoscopy every 10 years starting at age 53. Hepatitis C blood test. Hepatitis B blood test. Sexually transmitted disease (STD) testing. Diabetes screening. This is done by checking your blood sugar (glucose) after you have not eaten for a while (fasting). You may have this done every 1-3 years. Mammogram. This may be done every 1-2 years. Talk to your health  care provider about when you should start having regular mammograms. This may depend on whether you have a family history of breast cancer. BRCA-related cancer screening. This may be done if you have a family history of breast, ovarian, tubal, or peritoneal cancers. Pelvic exam and Pap test. This may be done every 3 years starting at age 26. Starting at age 67, this may be done every 5 years if you have a Pap test in combination with an HPV test. Bone density scan. This is done to screen for osteoporosis. You may have this scan if you are at high risk for osteoporosis. Discuss your test results, treatment options, and if necessary, the need for more tests with your health care provider. Vaccines  Your health care provider may recommend certain vaccines, such as: Influenza vaccine. This is recommended every year. Tetanus, diphtheria, and acellular pertussis (Tdap, Td) vaccine. You may need a Td booster every 10 years. Zoster vaccine. You may need this after age 8. Pneumococcal 13-valent conjugate (PCV13) vaccine. You may need this if you have certain conditions and were not previously vaccinated. Pneumococcal polysaccharide (PPSV23) vaccine. You may need one or two doses if you smoke cigarettes or if you have certain conditions. Talk to your health care provider about which screenings and vaccines you need and how often you need them. This information is not intended to replace advice given to you by your health care provider. Make sure you discuss any questions you have with your health care provider. Document Released: 03/21/2015 Document Revised: 11/12/2015 Document Reviewed: 12/24/2014 Elsevier Interactive Patient Education  2017 [employer].    Fall Prevention in the Home Falls can cause injuries. They can happen to people of all ages. There are many things you can do to make your home safe and to help prevent falls. What can I do on the outside of my home? Regularly fix the edges of  walkways and driveways and fix any cracks. Remove anything that might make you trip as you walk through a door, such as a raised step or threshold. Trim any bushes or trees on the path to your home. Use bright outdoor lighting. Clear any walking paths of anything that might make someone trip, such as rocks or tools. Regularly check to see if handrails are loose or broken. Make sure that both sides of any steps have handrails. Any raised decks and porches should have guardrails on the edges. Have any leaves, snow, or ice cleared regularly. Use sand or salt on walking paths during winter. Clean up any spills in your garage right away. This includes oil or grease spills. What can I do in the bathroom? Use night lights. Install grab bars by the toilet and in the tub and shower. Do not use towel bars as grab bars. Use non-skid mats or decals in the tub or shower. If you need to sit down in the shower, use a plastic, non-slip stool. Keep the floor dry. Clean up any water that spills on the floor as soon as it  happens. Remove soap buildup in the tub or shower regularly. Attach bath mats securely with double-sided non-slip rug tape. Do not have throw rugs and other things on the floor that can make you trip. What can I do in the bedroom? Use night lights. Make sure that you have a light by your bed that is easy to reach. Do not use any sheets or blankets that are too big for your bed. They should not hang down onto the floor. Have a firm chair that has side arms. You can use this for support while you get dressed. Do not have throw rugs and other things on the floor that can make you trip. What can I do in the kitchen? Clean up any spills right away. Avoid walking on wet floors. Keep items that you use a lot in easy-to-reach places. If you need to reach something above you, use a strong step stool that has a grab bar. Keep electrical cords out of the way. Do not use floor polish or wax that  makes floors slippery. If you must use wax, use non-skid floor wax. Do not have throw rugs and other things on the floor that can make you trip. What can I do with my stairs? Do not leave any items on the stairs. Make sure that there are handrails on both sides of the stairs and use them. Fix handrails that are broken or loose. Make sure that handrails are as long as the stairways. Check any carpeting to make sure that it is firmly attached to the stairs. Fix any carpet that is loose or worn. Avoid having throw rugs at the top or bottom of the stairs. If you do have throw rugs, attach them to the floor with carpet tape. Make sure that you have a light switch at the top of the stairs and the bottom of the stairs. If you do not have them, ask someone to add them for you. What else can I do to help prevent falls? Wear shoes that: Do not have high heels. Have rubber bottoms. Are comfortable and fit you well. Are closed at the toe. Do not wear sandals. If you use a stepladder: Make sure that it is fully opened. Do not climb a closed stepladder. Make sure that both sides of the stepladder are locked into place. Ask someone to hold it for you, if possible. Clearly mark and make sure that you can see: Any grab bars or handrails. First and last steps. Where the edge of each step is. Use tools that help you move around (mobility aids) if they are needed. These include: Canes. Walkers. Scooters. Crutches. Turn on the lights when you go into a dark area. Replace any light bulbs as soon as they burn out. Set up your furniture so you have a clear path. Avoid moving your furniture around. If any of your floors are uneven, fix them. If there are any pets around you, be aware of where they are. Review your medicines with your doctor. Some medicines can make you feel dizzy. This can increase your chance of falling. Ask your doctor what other things that you can do to help prevent falls. This  information is not intended to replace advice given to you by your health care provider. Make sure you discuss any questions you have with your health care provider. Document Released: 12/19/2008 Document Revised: 07/31/2015 Document Reviewed: 03/29/2014 Elsevier Interactive Patient Education  2017 [employer].

## 2022-08-03 DIAGNOSIS — H353132 Nonexudative age-related macular degeneration, bilateral, intermediate dry stage: Secondary | ICD-10-CM | POA: Diagnosis not present

## 2022-08-18 ENCOUNTER — Ambulatory Visit: Payer: PPO | Admitting: Family Medicine

## 2022-08-24 ENCOUNTER — Ambulatory Visit: Payer: PPO | Admitting: Family Medicine

## 2022-09-15 DIAGNOSIS — C678 Malignant neoplasm of overlapping sites of bladder: Secondary | ICD-10-CM | POA: Diagnosis not present

## 2022-09-15 DIAGNOSIS — N5201 Erectile dysfunction due to arterial insufficiency: Secondary | ICD-10-CM | POA: Diagnosis not present

## 2022-09-15 DIAGNOSIS — N400 Enlarged prostate without lower urinary tract symptoms: Secondary | ICD-10-CM | POA: Diagnosis not present

## 2022-09-24 ENCOUNTER — Other Ambulatory Visit: Payer: Self-pay | Admitting: Family Medicine

## 2022-09-28 NOTE — Patient Instructions (Signed)

## 2022-09-29 ENCOUNTER — Encounter: Payer: Self-pay | Admitting: Student

## 2022-09-29 ENCOUNTER — Ambulatory Visit (INDEPENDENT_AMBULATORY_CARE_PROVIDER_SITE_OTHER): Payer: PPO | Admitting: Family Medicine

## 2022-09-29 ENCOUNTER — Encounter: Payer: Self-pay | Admitting: Family Medicine

## 2022-09-29 ENCOUNTER — Ambulatory Visit: Payer: PPO | Attending: Student | Admitting: Student

## 2022-09-29 ENCOUNTER — Telehealth: Payer: Self-pay | Admitting: Interventional Cardiology

## 2022-09-29 VITALS — BP 129/87 | HR 69 | Wt 246.0 lb

## 2022-09-29 VITALS — BP 132/76 | HR 73 | Ht 67.0 in | Wt 244.8 lb

## 2022-09-29 DIAGNOSIS — E7800 Pure hypercholesterolemia, unspecified: Secondary | ICD-10-CM | POA: Diagnosis not present

## 2022-09-29 DIAGNOSIS — R079 Chest pain, unspecified: Secondary | ICD-10-CM | POA: Diagnosis not present

## 2022-09-29 DIAGNOSIS — Z7984 Long term (current) use of oral hypoglycemic drugs: Secondary | ICD-10-CM

## 2022-09-29 DIAGNOSIS — I10 Essential (primary) hypertension: Secondary | ICD-10-CM

## 2022-09-29 DIAGNOSIS — E785 Hyperlipidemia, unspecified: Secondary | ICD-10-CM | POA: Diagnosis not present

## 2022-09-29 DIAGNOSIS — R072 Precordial pain: Secondary | ICD-10-CM

## 2022-09-29 DIAGNOSIS — N182 Chronic kidney disease, stage 2 (mild): Secondary | ICD-10-CM

## 2022-09-29 DIAGNOSIS — I25110 Atherosclerotic heart disease of native coronary artery with unstable angina pectoris: Secondary | ICD-10-CM

## 2022-09-29 DIAGNOSIS — I25118 Atherosclerotic heart disease of native coronary artery with other forms of angina pectoris: Secondary | ICD-10-CM | POA: Diagnosis not present

## 2022-09-29 DIAGNOSIS — E1121 Type 2 diabetes mellitus with diabetic nephropathy: Secondary | ICD-10-CM

## 2022-09-29 LAB — POCT GLYCOSYLATED HEMOGLOBIN (HGB A1C)
HbA1c POC (<> result, manual entry): 6.6 % (ref 4.0–5.6)
HbA1c, POC (controlled diabetic range): 6.6 % (ref 0.0–7.0)
HbA1c, POC (prediabetic range): 6.6 % — AB (ref 5.7–6.4)
Hemoglobin A1C: 6.6 % — AB (ref 4.0–5.6)

## 2022-09-29 LAB — BASIC METABOLIC PANEL
BUN: 17 mg/dL (ref 6–23)
CO2: 28 mEq/L (ref 19–32)
Calcium: 9.3 mg/dL (ref 8.4–10.5)
Chloride: 105 mEq/L (ref 96–112)
Creatinine, Ser: 0.96 mg/dL (ref 0.40–1.50)
GFR: 77.23 mL/min (ref >60.00)
Glucose, Bld: 136 mg/dL — ABNORMAL HIGH (ref 70–99)
Potassium: 4.3 mEq/L (ref 3.5–5.1)
Sodium: 142 mEq/L (ref 135–145)

## 2022-09-29 MED ORDER — CLOPIDOGREL BISULFATE 75 MG PO TABS: 75.0000 mg | ORAL_TABLET | Freq: Every day | ORAL | 1 refills | Status: DC

## 2022-09-29 MED ORDER — ATORVASTATIN CALCIUM 80 MG PO TABS: 80.0000 mg | ORAL_TABLET | Freq: Every day | ORAL | 1 refills | Status: DC

## 2022-09-29 MED ORDER — METOPROLOL SUCCINATE ER 100 MG PO TB24: ORAL_TABLET | ORAL | 1 refills | Status: DC

## 2022-09-29 MED ORDER — ESCITALOPRAM OXALATE 20 MG PO TABS: 20.0000 mg | ORAL_TABLET | Freq: Every day | ORAL | 1 refills | Status: DC

## 2022-09-29 MED ORDER — FUROSEMIDE 40 MG PO TABS: 40.0000 mg | ORAL_TABLET | Freq: Every day | ORAL | 1 refills | Status: DC

## 2022-09-29 MED ORDER — LISINOPRIL 40 MG PO TABS: ORAL_TABLET | ORAL | 1 refills | Status: DC

## 2022-09-29 MED ORDER — GABAPENTIN 300 MG PO CAPS: 300.0000 mg | ORAL_CAPSULE | Freq: Two times a day (BID) | ORAL | 1 refills | Status: DC

## 2022-09-29 NOTE — Progress Notes (Signed)
Cardiology Clinic Note   Date: 09/29/2022 ID: Richard [name], DOB 19-Jan-1946, MRN [id number]  Primary Cardiologist:  Lance Muss, MD  Patient Profile    Richard [name] is a 77 y.o. male who presents to the clinic today for evaluation of chest pain.     Past medical history significant for: CAD. LHC performed at [facility] 04/20/2011: Stent to LAD. LHC 10/08/2012 (unstable angina): Patent proximal LAD stent with 30% in-stent restenosis. LHC 05/22/2018 (unstable angina): Distal LM 20%.  Proximal LAD 75% with in-stent restenosis.  PCI with DES to proximal LAD. Echo 05/21/2018: EF 60 to 65%.  Grade I DD.  Normal RV function.  No significant valve abnormalities. LHC 08/30/2019: Distal LM 20%. Mid LAD 25%. Patent proximal LAD stent.  Hypertension.  Hyperlipidemia.  Lipid panel 06/30/2022: LDL 66, HDL 50, TG 162, total 140. OSA. Bladder cancer.  S/p TURBT.      History of Present Illness    Richard [name] is a longtime patient of cardiology.  Stent placed to LAD at [facility] February 2013.  He was first evaluated by Dr. Elease Hashimoto on 10/18/2012 during hospital admission for chest pain. He underwent LHC which showed patent proximal Lad sent with 30% in-stent restenosis. He was not seen again until 05/22/2018 during another hospital admission for chest pain. He underwent PCI with DES to proximal LAD for in-stent restenosis. He continued to be followed by Dr. Eldridge Dace. On 08/24/2019 he was evaluated by Dr. Excell Seltzer as a work in on the DOD schedule for chest pain. He was scheduled for a LHC which showed a patent proximal LAD stent.  He was last seen in the office by Richard Shields, NP for routine follow up. At that time he was doing well and no medication changes were made.  Today, patient is added to my schedule at the request of PCP. Patient was seen by PCP for routine follow up and reported chest pain. He is accompanied by his wife. He reports a 2 month history of central chest pain  described as a dull/heavy feeling that typically occurs with exertion like walking or climbing stairs, however he has also had episodes at rest. He is unsure how long the pain lasts. NTG relieved the pain somewhat but not completely and there were several times he took several doses in an hour. Pain is associated with shortness of breath. Pain is similar and sometimes identical to prior anginal pain.  He also reports increased fatigue in the same time period. He states he can get up in the morning and then go right back to sleep until the afternoon and then he is ready to go back to bed by early evening. This is unusual for him, as he likes to stay busy. He reports lower extremity edema is well managed with Lasix.    ROS: All other systems reviewed and are otherwise negative except as noted in History of Present Illness.  Studies Reviewed    EKG Interpretation Date/Time:  Wednesday September 29 2022 14:43:00 EDT Ventricular Rate:  73 PR Interval:  194 QRS Duration:  112 QT Interval:  406 QTC Calculation: 447 R Axis:   -67  Text Interpretation: Normal sinus rhythm Left anterior fascicular block Moderate voltage criteria for LVH, may be normal variant ( R in aVL , Cornell product ) When compared with ECG of 22-May-2018 13:01, No significant change was found Confirmed by Richard [name] [phone]) on 09/29/2022 2:55:43 PM    Physical Exam    VS:  BP 132/76 (BP Location: Left Arm, Patient Position: Sitting, Cuff Size: Large)   Pulse 73   Ht 5\' 7"  (1.702 m)   Wt 244 lb 12.8 oz (111 kg)   SpO2 97%   BMI 38.34 kg/m  , BMI Body mass index is 38.34 kg/m.  GEN: Well nourished, well developed, in no acute distress. Neck: No JVD or carotid bruits. Cardiac:  RRR. No murmurs. No rubs or gallops.   Respiratory:  Respirations regular and unlabored. Clear to auscultation without rales, wheezing or rhonchi. GI: Soft, nontender, nondistended. Extremities: Radials/DP/PT 2+ and equal bilaterally. No clubbing  or cyanosis. Mild nonpitting edema bilateral lower extremities.   Skin: Warm and dry, no rash. Neuro: Strength intact.  Assessment & Plan    CAD/unstable angina. S/p proximal LAD stent February 2013 with in-stent restenosis and PCI with DES March 2020. Last [facility] June 2021 showed patent proximal LAD stent. Patient reports a 2 month history of central chest pain described as dull heaviness with associated shortness of breath. Pain is somewhat relieved with NTG. He is unsure how long pain lasts but there were a few episodes of pain that he took several NTG in an hour. This pain is his anginal equivalent. Discussed with Dr. Wyline Mood, DOD, who agrees patient should undergo LHC for definitive evaluation of chest pain. Patient and wife are in agreement. Patient will undergo LHC on 10/01/2022 with Dr. Tresa Endo. Continue aspirin, Plavix, atorvastatin, Zetia, metoprolol, prn SL NTG.  Hypertension: BP today 164/82 on intake and 132/76 on my recheck. Patient denies headaches, dizziness or vision changes. Continue metoprolol, lisinopril.   Hyperlipidemia. LDL April 2024 66, at goal. Continue atorvastatin and Zetia.   Disposition: BMP and CBC today. LHC for unstable angina. Return in 3 weeks or sooner as needed.      Informed Consent   Shared Decision Making/Informed Consent The risks [stroke (1 in 1000), death (1 in 1000), kidney failure [usually temporary] (1 in 500), bleeding (1 in 200), allergic reaction [possibly serious] (1 in 200)], benefits (diagnostic support and management of coronary artery disease) and alternatives of a cardiac catheterization were discussed in detail with Richard [name] and he is willing to proceed.      Signed, Etta Grandchild. Deliah Strehlow, DNP, NP-C

## 2022-09-29 NOTE — Progress Notes (Signed)
OFFICE VISIT  09/29/2022  CC:  Chief Complaint  Patient presents with   Medical Management of Chronic Issues    Pt states he has been feeling fatigued, as well as having some allergy issues.    Patient is a 77 y.o. male who presents accompanied by his wife for 57-month follow-up diabetes, hypertension, hyperlipidemia, and chronic renal insufficiency stage II. A/P as of last visit: " 1 diabetes with nephropathy. Good control on metformin 500 mg twice daily. POC Hba1c today is 6.2%.   #2 hypertension, doing well on lisinopril 20 mg a day and Toprol-XL 100 mg twice daily. Electrolytes and creatinine today.   #3 chronic renal insufficiency stage II.   Basic metabolic panel today. Of note, he does take Lasix 40 mg daily.   #4 hypercholesterolemia. He is on Zetia 10 mg a day and a atorvastatin 80 mg a day. Last LDL about 6 months ago was 53. Lipid panel and hepatic panel today."  INTERIM HX: His wife accompanies him today because she was concerned he would not tell me about his chest pain. For approximately the last several weeks he has been having substernal chest pain and significant pressure.  It occurs both at rest and with going up stairs or excessive walking.  He does break out in sweats sometimes and gets some nausea on and off.  He has to take nitroglycerin but is unclear how often.  He says he sometimes takes several in a row in the course of an hour and it did not help much at all. He endorses some intermittent dizziness.  He also has cough.  No fever. He has still been compliant with all of his medications. No home blood pressure or glucose monitoring.  During history taking he attempts to minimize his symptoms.  ROS as above, plus--> no wheezing, no dizziness, no HAs, no rashes, no melena/hematochezia.  No polyuria or polydipsia.  No myalgias or arthralgias.  No focal weakness, paresthesias, or tremors.  No acute vision or hearing abnormalities.  No dysuria or unusual/new  urinary urgency or frequency.  No recent changes in lower legs. No n/v/d or abd pain.  No palpitations.    Past Medical History:  Diagnosis Date   Chronic renal insufficiency, stage III (moderate) (HCC)    Coronary artery disease    a. DES to LAD 04/2011 - took Effient/ASA x 1 yr, now on ASA only. b. Cath 10/2012: patent stent, otherwise normal cors. c. 05/2018 Prox LAD stent 75% restenosed, DES placed.  08/30/19 cath: patent stent, mild distal LAD dz not requiring intervention, LVEF nl, EDP normal: maximize antianginal meds.   COVID 04/04/2020   asymptomatic   Diabetes mellitus with complication (HCC) 05/2018   A1c 6.9%   GERD (gastroesophageal reflux disease)    HAS HAD ESOPHAGUS STRETCHED SEVERAL TIMES IN THE PAST   History of bladder cancer 04/2020   TURBT 04/2020.  Surveillance cysto clear as of 07/2021   Hyperlipidemia    goal LDL < 70   Hypertension    Myocardial infarction (HCC) 04/21/2016   OSA on CPAP 10/18/2011   doesn't wear it   Poor historian    talk to dtr [facility] cell [phone]   Right knee pain    RIGHT KNEE MEDIAL MENSICAL TEAR    Past Surgical History:  Procedure Laterality Date   CARDIAC CATHETERIZATION  06/2017   In-stent restenosis cleared out   CARDIOVASCULAR STRESS TEST     Latter part of 2019--normal per cardiologist's  note from 10/2017   CERVICAL FUSION  1990 and 1993   X 2   SURGERIES    SLIGHT LIMITATION ROM   COLONOSCOPY  11/24/2004; 2018   NORMAL.  Recall 2016.  Repeat 2018->tubular adenoma x 1 (Dr. Caryl Never, Dig [facility] spec).  06/2022 polyp x 1   CORONARY ANGIOPLASTY WITH STENT PLACEMENT     CORONARY STENT INTERVENTION N/A 05/22/2018   Prox LAD for in stent restenosis.  DAPT x 1 yr.  Procedure: CORONARY STENT INTERVENTION;  Surgeon: Corky Crafts, MD;  Location: [facility] INVASIVE CV LAB;  Service: Cardiovascular;  Laterality: N/A;   CORONARY ULTRASOUND/IVUS N/A 05/22/2018   Procedure: Intravascular Ultrasound/IVUS;  Surgeon: Corky Crafts, MD;   Location: [facility] INVASIVE CV LAB;  Service: Cardiovascular;  Laterality: N/A;   CYSTOSCOPY W/ RETROGRADES Bilateral 04/22/2020   Procedure: CYSTOSCOPY WITH RETROGRADE PYELOGRAM;  Surgeon: Belva Agee, MD;  Location: [facility];  Service: Urology;  Laterality: Bilateral;  30 MINS   ESOPHAGOGASTRODUODENOSCOPY  05/2016   mild reactive changes   KNEE ARTHROSCOPY  10/22/2011   Procedure: ARTHROSCOPY KNEE;  Surgeon: Loanne Drilling, MD;  Location: [facility];  Service: Orthopedics;  Laterality: Right;  Right knee scope with debridement   LAPAROSCOPIC CHOLECYSTECTOMY  2018   LEFT HEART CATH AND CORONARY ANGIOGRAPHY N/A 05/22/2018   DES placed for in stent restenosis.  EF 55-60%.  Procedure: LEFT HEART CATH AND CORONARY ANGIOGRAPHY;  Surgeon: Corky Crafts, MD;  Location: [facility] INVASIVE CV LAB;  Service: Cardiovascular;  Laterality: N/A;   LEFT HEART CATH AND CORONARY ANGIOGRAPHY N/A 08/30/2019   No change from 2020 cath->patent LAD stent, mild distal LAD dz.  No intervention required. Nitrates + possible future CCB antianginal for poss microvasc dz. Procedure: LEFT HEART CATH AND CORONARY ANGIOGRAPHY;  Surgeon: Corky Crafts, MD;  Location: [facility] INVASIVE CV LAB;  Service: Cardiovascular;  Laterality: N/A;   LEFT HEART CATHETERIZATION WITH CORONARY ANGIOGRAM N/A 10/08/2012   Procedure: LEFT HEART CATHETERIZATION WITH CORONARY ANGIOGRAM;  Surgeon: Runell Gess, MD;  Location: [facility] CATH LAB;  Service: Cardiovascular;  Laterality: N/A;   NASAL SINUS SURGERY  yrs ago   ONE SINUS SURGERY THRU NOSE AND ANOTHER SINUS SURGERY THRU INCISION ABOVE RT EYE   TRANSTHORACIC ECHOCARDIOGRAM  05/21/2018   EF 60-65%, grd I DD, no valvular probs.   TRANSTHORACIC ECHOCARDIOGRAM  05/21/2018   EF 60-65%, normal wall motion, +DD, normal valves and RV fxn.   TRANSURETHRAL RESECTION OF BLADDER TUMOR N/A 04/22/2020   PATH: NONinvasive high grade uroepithelial malignancy. Procedure: TRANSURETHRAL  RESECTION OF BLADDER TUMOR (TURBT); FULGERATION;  Surgeon: Belva Agee, MD;  Location: [facility];  Service: Urology;  Laterality: N/A;    Outpatient Medications Prior to Visit  Medication Sig Dispense Refill   acetaminophen (TYLENOL) 500 MG tablet Take 500 mg by mouth as needed for mild pain or moderate pain (Sinus).     aspirin EC 81 MG EC tablet Take 1 tablet (81 mg total) by mouth daily.     ezetimibe (ZETIA) 10 MG tablet Take 1 tablet (10 mg total) by mouth daily. 90 tablet 3   metFORMIN (GLUCOPHAGE) 500 MG tablet Take 1 tablet (500 mg total) by mouth 2 (two) times daily with a meal.     nitroGLYCERIN (NITROSTAT) 0.4 MG SL tablet PLACE 1 TABLET UNDER THE TONGUE EVERY 5 MINUTES X 3 DOSES AS NEEDED FOR CHEST PAIN. 25 tablet 6   vitamin B-12 (CYANOCOBALAMIN) 1000 MCG tablet  Take 1,000 mcg by mouth every other day.     atorvastatin (LIPITOR) 80 MG tablet Take 1 tablet (80 mg total) by mouth daily. TAKE 1 TABLET BY MOUTH DAILY AT 6 PM. 90 tablet 1   clopidogrel (PLAVIX) 75 MG tablet Take 1 tablet (75 mg total) by mouth daily with breakfast. 90 tablet 1   escitalopram (LEXAPRO) 20 MG tablet Take 1 tablet (20 mg total) by mouth daily. 90 tablet 1   furosemide (LASIX) 40 MG tablet Take 1 tablet (40 mg total) by mouth daily. Take every  day except: Sunday 90 tablet 1   gabapentin (NEURONTIN) 300 MG capsule Take 1 capsule (300 mg total) by mouth 2 (two) times daily. 180 capsule 1   lisinopril (ZESTRIL) 40 MG tablet TAKE 1/2 TABLET (20 MG TOTAL) BY MOUTH DAILY. 45 tablet 1   metoprolol succinate (TOPROL-XL) 100 MG 24 hr tablet Take with or immediately following a meal. 90 tablet 1   No facility-administered medications prior to visit.    No Known Allergies  Review of Systems As per HPI  PE:    09/29/2022    8:12 AM 07/07/2022    3:33 PM 06/30/2022    9:22 AM  Vitals with BMI  Weight 246 lbs 225 lbs 238 lbs 10 oz  Systolic 129  124  Diastolic 87  80  Pulse 69  57      Physical Exam  Gen: Alert, well appearing.  Patient is oriented to person, place, time, and situation. CV: RRR, no m/r/g.   LUNGS: CTA bilat, nonlabored resps, good aeration in all lung fields. EXT: no clubbing or cyanosis.  no edema.    LABS:  Last CBC Lab Results  Component Value Date   WBC 7.8 06/30/2022   HGB 14.7 06/30/2022   HCT 44.0 06/30/2022   MCV 98.2 06/30/2022   MCH 31.3 05/22/2018   RDW 13.6 06/30/2022   PLT 201.0 06/30/2022   Last metabolic panel Lab Results  Component Value Date   GLUCOSE 120 (H) 06/30/2022   NA 141 06/30/2022   K 4.6 06/30/2022   CL 103 06/30/2022   CO2 30 06/30/2022   BUN 15 06/30/2022   CREATININE 1.04 06/30/2022   GFR 70.28 06/30/2022   CALCIUM 9.3 06/30/2022   PROT 6.1 06/30/2022   ALBUMIN 4.1 06/30/2022   BILITOT 0.6 06/30/2022   ALKPHOS 59 06/30/2022   AST 13 06/30/2022   ALT 10 06/30/2022   ANIONGAP 8 05/22/2018   Last lipids Lab Results  Component Value Date   CHOL 148 06/30/2022   HDL 49.70 06/30/2022   LDLCALC 66 06/30/2022   LDLDIRECT 115.0 03/11/2021   TRIG 162.0 (H) 06/30/2022   CHOLHDL 3 06/30/2022   Last hemoglobin A1c Lab Results  Component Value Date   HGBA1C 6.6 (A) 09/29/2022   HGBA1C 6.6 09/29/2022   HGBA1C 6.6 (A) 09/29/2022   HGBA1C 6.6 09/29/2022   Last thyroid functions Lab Results  Component Value Date   TSH 1.54 08/24/2019    12 lead EKG today: Normal sinus rhythm, rate 66, no ST-T wave changes, no Q waves, no T wave inversions.  QRS duration normal QT interval normal.  No hypertrophy. Anterior fascicular block noted. This EKG is unchanged compared to 12/08/2020 and 08/24/2019.  IMPRESSION AND PLAN:  #1 chest pain in the setting of known coronary artery disease/history of stent. Concerning for unstable angina. EKG today with no acute ischemic changes. He has not seen his cardiologist since October 2022. Cardiology graciously  got him on their schedule for this afternoon.  #2  hypertension, well-controlled on lisinopril 20 mg a day and Toprol-XL 100 mg a day. Electrolytes and creatinine today.  3.  Chronic renal insufficiency stage II.   Basic metabolic panel today. Of note, he does take Lasix 40 mg daily.  #4 hypercholesterolemia. He is on Zetia 10 mg a day and a atorvastatin 80 mg a day. Last LDL about 3 months ago was 66. Plan repeat lipids and hepatic panel in 3 months.  5.  DM, good control on metformin 500 bid. Hba1c today is 6.6%.  An After Visit Summary was printed and given to the patient.  FOLLOW UP: TBD based on further cardiology eval Next CPE 06/2023 Signed:  Santiago Bumpers, MD           09/29/2022

## 2022-09-29 NOTE — H&P (View-Only) (Signed)
Cardiology Clinic Note   Date: 09/29/2022 ID: HUDSEN SECHLER, DOB 1945/11/07, MRN [id number]  Primary Cardiologist:  Lance Muss, MD  Patient Profile    Richard [name] is a 77 y.o. male who presents to the clinic today for evaluation of chest pain.     Past medical history significant for: CAD. LHC performed at [facility] 04/20/2011: Stent to LAD. LHC 10/08/2012 (unstable angina): Patent proximal LAD stent with 30% in-stent restenosis. LHC 05/22/2018 (unstable angina): Distal LM 20%.  Proximal LAD 75% with in-stent restenosis.  PCI with DES to proximal LAD. Echo 05/21/2018: EF 60 to 65%.  Grade I DD.  Normal RV function.  No significant valve abnormalities. LHC 08/30/2019: Distal LM 20%. Mid LAD 25%. Patent proximal LAD stent.  Hypertension.  Hyperlipidemia.  Lipid panel 06/30/2022: LDL 66, HDL 50, TG 162, total 140. OSA. Bladder cancer.  S/p TURBT.      History of Present Illness    Richard [name] is a longtime patient of cardiology.  Stent placed to LAD at [facility] February 2013.  He was first evaluated by Dr. Elease Hashimoto on 10/18/2012 during hospital admission for chest pain. He underwent LHC which showed patent proximal Lad sent with 30% in-stent restenosis. He was not seen again until 05/22/2018 during another hospital admission for chest pain. He underwent PCI with DES to proximal LAD for in-stent restenosis. He continued to be followed by Dr. Eldridge Dace. On 08/24/2019 he was evaluated by Dr. Excell Seltzer as a work in on the DOD schedule for chest pain. He was scheduled for a LHC which showed a patent proximal LAD stent.  He was last seen in the office by Gillian Shields, NP for routine follow up. At that time he was doing well and no medication changes were made.  Today, patient is added to my schedule at the request of PCP. Patient was seen by PCP for routine follow up and reported chest pain. He is accompanied by his wife. He reports a 2 month history of central chest pain  described as a dull/heavy feeling that typically occurs with exertion like walking or climbing stairs, however he has also had episodes at rest. He is unsure how long the pain lasts. NTG relieved the pain somewhat but not completely and there were several times he took several doses in an hour. Pain is associated with shortness of breath. Pain is similar and sometimes identical to prior anginal pain.  He also reports increased fatigue in the same time period. He states he can get up in the morning and then go right back to sleep until the afternoon and then he is ready to go back to bed by early evening. This is unusual for him, as he likes to stay busy. He reports lower extremity edema is well managed with Lasix.    ROS: All other systems reviewed and are otherwise negative except as noted in History of Present Illness.  Studies Reviewed    EKG Interpretation Date/Time:  Wednesday September 29 2022 14:43:00 EDT Ventricular Rate:  73 PR Interval:  194 QRS Duration:  112 QT Interval:  406 QTC Calculation: 447 R Axis:   -67  Text Interpretation: Normal sinus rhythm Left anterior fascicular block Moderate voltage criteria for LVH, may be normal variant ( R in aVL , Cornell product ) When compared with ECG of 22-May-2018 13:01, No significant change was found Confirmed by Carlos Levering [phone]) on 09/29/2022 2:55:43 PM    Physical Exam    VS:  BP 132/76 (BP Location: Left Arm, Patient Position: Sitting, Cuff Size: Large)   Pulse 73   Ht 5\' 7"  (1.702 m)   Wt 244 lb 12.8 oz (111 kg)   SpO2 97%   BMI 38.34 kg/m  , BMI Body mass index is 38.34 kg/m.  GEN: Well nourished, well developed, in no acute distress. Neck: No JVD or carotid bruits. Cardiac:  RRR. No murmurs. No rubs or gallops.   Respiratory:  Respirations regular and unlabored. Clear to auscultation without rales, wheezing or rhonchi. GI: Soft, nontender, nondistended. Extremities: Radials/DP/PT 2+ and equal bilaterally. No clubbing  or cyanosis. Mild nonpitting edema bilateral lower extremities.   Skin: Warm and dry, no rash. Neuro: Strength intact.  Assessment & Plan    CAD/unstable angina. S/p proximal LAD stent February 2013 with in-stent restenosis and PCI with DES March 2020. Last [facility] June 2021 showed patent proximal LAD stent. Patient reports a 2 month history of central chest pain described as dull heaviness with associated shortness of breath. Pain is somewhat relieved with NTG. He is unsure how long pain lasts but there were a few episodes of pain that he took several NTG in an hour. This pain is his anginal equivalent. Discussed with Dr. Wyline Mood, DOD, who agrees patient should undergo LHC for definitive evaluation of chest pain. Patient and wife are in agreement. Patient will undergo LHC on 10/01/2022 with Dr. Tresa Endo. Continue aspirin, Plavix, atorvastatin, Zetia, metoprolol, prn SL NTG.  Hypertension: BP today 164/82 on intake and 132/76 on my recheck. Patient denies headaches, dizziness or vision changes. Continue metoprolol, lisinopril.   Hyperlipidemia. LDL April 2024 66, at goal. Continue atorvastatin and Zetia.   Disposition: BMP and CBC today. LHC for unstable angina. Return in 3 weeks or sooner as needed.      Informed Consent   Shared Decision Making/Informed Consent The risks [stroke (1 in 1000), death (1 in 1000), kidney failure [usually temporary] (1 in 500), bleeding (1 in 200), allergic reaction [possibly serious] (1 in 200)], benefits (diagnostic support and management of coronary artery disease) and alternatives of a cardiac catheterization were discussed in detail with Mr. Sport and he is willing to proceed.      Signed, Etta Grandchild. Ashani Pumphrey, DNP, NP-C

## 2022-09-29 NOTE — Telephone Encounter (Signed)
[facility] at [facility] called stating they would like patient to be scheduled today due to abnormal EKG and prior chest pain. Scheduled patient with Richard Levering, NP at 2:45 P.M. today 07/24 with Judeth Cornfield, CMA giving the ok.

## 2022-09-29 NOTE — Patient Instructions (Signed)
Medication Instructions:  No Changes *If you need a refill on your cardiac medications before your next appointment, please call your pharmacy*   Lab Work: BMET, CBC Today If you have labs (blood work) drawn today and your tests are completely normal, you will receive your results only by: MyChart Message (if you have MyChart) OR A paper copy in the mail If you have any lab test that is abnormal or we need to change your treatment, we will call you to review the results.   Testing/Procedures:       Cardiac/Peripheral Catheterization   You are scheduled for a Cardiac Catheterization on Friday, July 26 with Dr. Nicki Guadalajara.  1. Please arrive at the [facility] (Main Entrance A) at [facility]: [address] [place], [place] [address] at 5:30 AM (This time is 2 hour(s) before your procedure to ensure your preparation). Free valet parking service is available. You will check in at ADMITTING. The support person will be asked to wait in the waiting room.  It is OK to have someone drop you off and come back when you are ready to be discharged.        Special note: Every effort is made to have your procedure done on time. Please understand that emergencies sometimes delay scheduled procedures.  2. Diet: Do not eat solid foods after midnight.  You may have clear liquids until 5 AM the day of the procedure.  3. Labs: You will need to have blood drawn on Wednesday, July 24 at [facility] [address], [place]  Open: 8am - 5pm (Lunch 12:30 - 1:30)   Phone: [phone]. You do not need to be fasting.  4. Medication instructions in preparation for your procedure:   Contrast Allergy: No   Current Outpatient Medications (Endocrine & Metabolic):    metFORMIN (GLUCOPHAGE) 500 MG tablet, Take 1 tablet (500 mg total) by mouth 2 (two) times daily with a meal.  Current Outpatient Medications (Cardiovascular):    atorvastatin (LIPITOR) 80 MG tablet, Take 1 tablet (80 mg  total) by mouth daily. TAKE 1 TABLET BY MOUTH DAILY AT 6 PM.   ezetimibe (ZETIA) 10 MG tablet, Take 1 tablet (10 mg total) by mouth daily.   furosemide (LASIX) 40 MG tablet, Take 1 tablet (40 mg total) by mouth daily. Take every  day except: Sunday   lisinopril (ZESTRIL) 40 MG tablet, TAKE 1/2 TABLET (20 MG TOTAL) BY MOUTH DAILY.   metoprolol succinate (TOPROL-XL) 100 MG 24 hr tablet, Take with or immediately following a meal.   nitroGLYCERIN (NITROSTAT) 0.4 MG SL tablet, PLACE 1 TABLET UNDER THE TONGUE EVERY 5 MINUTES X 3 DOSES AS NEEDED FOR CHEST PAIN.   Current Outpatient Medications (Analgesics):    acetaminophen (TYLENOL) 500 MG tablet, Take 500 mg by mouth as needed for mild pain or moderate pain (Sinus).   aspirin EC 81 MG EC tablet, Take 1 tablet (81 mg total) by mouth daily.  Current Outpatient Medications (Hematological):    clopidogrel (PLAVIX) 75 MG tablet, Take 1 tablet (75 mg total) by mouth daily with breakfast.   vitamin B-12 (CYANOCOBALAMIN) 1000 MCG tablet, Take 1,000 mcg by mouth every other day.  Current Outpatient Medications (Other):    escitalopram (LEXAPRO) 20 MG tablet, Take 1 tablet (20 mg total) by mouth daily.   gabapentin (NEURONTIN) 300 MG capsule, Take 1 capsule (300 mg total) by mouth 2 (two) times daily. *For reference purposes while preparing patient instructions.   Delete this  med list prior to printing instructions for patient.*    Stop taking, Lisinopril (Zestril or Prinivil) Thursday, July 25,    Do not take Diabetes Med Glucophage (Metformin) on the day of the procedure and HOLD 48 HOURS AFTER THE PROCEDURE.  On the morning of your procedure, take Aspirin 81 mg and any morning medicines NOT listed above.  You may use sips of water.  5. Plan to go home the same day, you will only stay overnight if medically necessary. 6. You MUST have a responsible adult to drive you home. 7. An adult MUST be with you the first 24 hours after you arrive  home. 8. Bring a current list of your medications, and the last time and date medication taken. 9. Bring ID and current insurance cards. 10.Please wear clothes that are easy to get on and off and wear slip-on shoes.  Thank you for allowing [place] to care for you!   -- [facility]    Follow-Up: At [facility], you and your health needs are our priority.  As part of our continuing mission to provide you with exceptional heart care, we have created designated Provider Care Teams.  These Care Teams include your primary Cardiologist (physician) and Advanced Practice Providers (APPs -  Physician Assistants and Nurse Practitioners) who all work together to provide you with the care you need, when you need it.  We recommend signing up for the patient portal called "MyChart".  Sign up information is provided on this After Visit Summary.  MyChart is used to connect with patients for Virtual Visits (Telemedicine).  Patients are able to view lab/test results, encounter notes, upcoming appointments, etc.  Non-urgent messages can be sent to your provider as well.   To learn more about what you can do with MyChart, go to [email or web address].    Your next appointment:   2-3 week(s) Post Cath  Provider:   Carlos Levering, NP

## 2022-09-30 ENCOUNTER — Telehealth: Payer: Self-pay | Admitting: *Deleted

## 2022-09-30 LAB — BASIC METABOLIC PANEL
BUN/Creatinine Ratio: 16 (ref 10–24)
BUN: 15 mg/dL (ref 8–27)
CO2: 26 mmol/L (ref 20–29)
Chloride: 103 mmol/L (ref 96–106)
Creatinine, Ser: 0.91 mg/dL (ref 0.76–1.27)
Glucose: 113 mg/dL — ABNORMAL HIGH (ref 70–99)
Potassium: 4.5 mmol/L (ref 3.5–5.2)
Sodium: 143 mmol/L (ref 134–144)
eGFR: 88 mL/min/{1.73_m2} (ref >59)

## 2022-09-30 LAB — CBC
Hematocrit: 43.9 % (ref 37.5–51.0)
Hemoglobin: 15 g/dL (ref 13.0–17.7)
MCH: 33.3 pg — ABNORMAL HIGH (ref 26.6–33.0)
MCHC: 34.2 g/dL (ref 31.5–35.7)
MCV: 98 fL — ABNORMAL HIGH (ref 79–97)
Platelets: 162 10*3/uL (ref 150–450)
RBC: 4.5 x10E6/uL (ref 4.14–5.80)
WBC: 7.7 10*3/uL (ref 3.4–10.8)

## 2022-09-30 NOTE — Telephone Encounter (Addendum)
Cardiac Catheterization scheduled at [facility] for: Friday July 26,2024 7:30 AM Arrival time [facility] Main Entrance A at: 5:30 AM  Nothing to eat after midnight prior to procedure, clear liquids until 5 AM day of procedure.  Medication instructions: -Hold:  Metformin-pt reports he is currently not taking Metformin  Lasix-AM of procedure -Other usual morning medications can be taken with sips of water including aspirin 81 mg and Plavix 75 mg  Confirmed patient has responsible adult to drive home post procedure and be with patient first 24 hours after arriving home.  Plan to go home the same day, you will only stay overnight if medically necessary.  Reviewed procedure instructions with patient.

## 2022-10-01 ENCOUNTER — Ambulatory Visit (HOSPITAL_COMMUNITY)
Admission: RE | Admit: 2022-10-01 | Discharge: 2022-10-01 | Disposition: A | Discharge status: Home / Self Care | Payer: PPO | Source: Ambulatory Visit | Attending: Cardiovascular Disease | Admitting: Cardiovascular Disease

## 2022-10-01 ENCOUNTER — Encounter (HOSPITAL_COMMUNITY)
Admission: RE | Disposition: A | Discharge status: Home / Self Care | Payer: Self-pay | Source: Ambulatory Visit | Attending: Cardiovascular Disease

## 2022-10-01 ENCOUNTER — Other Ambulatory Visit: Payer: Self-pay

## 2022-10-01 DIAGNOSIS — I10 Essential (primary) hypertension: Secondary | ICD-10-CM | POA: Insufficient documentation

## 2022-10-01 DIAGNOSIS — I2510 Atherosclerotic heart disease of native coronary artery without angina pectoris: Secondary | ICD-10-CM | POA: Diagnosis not present

## 2022-10-01 DIAGNOSIS — Z79899 Other long term (current) drug therapy: Secondary | ICD-10-CM | POA: Insufficient documentation

## 2022-10-01 DIAGNOSIS — R600 Localized edema: Secondary | ICD-10-CM | POA: Insufficient documentation

## 2022-10-01 DIAGNOSIS — Z7902 Long term (current) use of antithrombotics/antiplatelets: Secondary | ICD-10-CM | POA: Diagnosis not present

## 2022-10-01 DIAGNOSIS — Z7982 Long term (current) use of aspirin: Secondary | ICD-10-CM | POA: Diagnosis not present

## 2022-10-01 DIAGNOSIS — E785 Hyperlipidemia, unspecified: Secondary | ICD-10-CM | POA: Diagnosis not present

## 2022-10-01 DIAGNOSIS — Z955 Presence of coronary angioplasty implant and graft: Secondary | ICD-10-CM | POA: Insufficient documentation

## 2022-10-01 DIAGNOSIS — I200 Unstable angina: Secondary | ICD-10-CM | POA: Diagnosis present

## 2022-10-01 DIAGNOSIS — I25110 Atherosclerotic heart disease of native coronary artery with unstable angina pectoris: Secondary | ICD-10-CM | POA: Insufficient documentation

## 2022-10-01 HISTORY — PX: LEFT HEART CATH AND CORONARY ANGIOGRAPHY: CATH118249

## 2022-10-01 LAB — GLUCOSE, CAPILLARY
Glucose-Capillary: 165 mg/dL — ABNORMAL HIGH (ref 70–99)
Glucose-Capillary: 121 mg/dL — ABNORMAL HIGH (ref 70–99)

## 2022-10-01 SURGERY — LEFT HEART CATH AND CORONARY ANGIOGRAPHY
Anesthesia: LOCAL

## 2022-10-01 MED ORDER — ASPIRIN 81 MG PO CHEW: 81.0000 mg | CHEWABLE_TABLET | ORAL | Status: DC

## 2022-10-01 MED ORDER — LABETALOL HCL 5 MG/ML IV SOLN: 10.0000 mg | INTRAVENOUS | Status: DC | PRN

## 2022-10-01 MED ORDER — MIDAZOLAM HCL 2 MG/2ML IJ SOLN
INTRAMUSCULAR | Status: DC | PRN
  Administered 2022-10-01: 1 mg via INTRAVENOUS

## 2022-10-01 MED ORDER — SODIUM CHLORIDE 0.9 % IV SOLN: INTRAVENOUS | Status: DC

## 2022-10-01 MED ORDER — LIDOCAINE HCL (PF) 1 % IJ SOLN
INTRAMUSCULAR | Status: AC
  Filled 2022-10-01: qty 30

## 2022-10-01 MED ORDER — HYDRALAZINE HCL 20 MG/ML IJ SOLN: 10.0000 mg | INTRAMUSCULAR | Status: DC | PRN

## 2022-10-01 MED ORDER — IOHEXOL 350 MG/ML SOLN
INTRAVENOUS | Status: DC | PRN
  Administered 2022-10-01: 52 mL via INTRA_ARTERIAL

## 2022-10-01 MED ORDER — LIDOCAINE HCL (PF) 1 % IJ SOLN
INTRAMUSCULAR | Status: DC | PRN
  Administered 2022-10-01: 2 mL

## 2022-10-01 MED ORDER — HEPARIN (PORCINE) IN NACL 1000-0.9 UT/500ML-% IV SOLN
INTRAVENOUS | Status: DC | PRN
  Administered 2022-10-01 (×2): 500 mL via INTRA_ARTERIAL

## 2022-10-01 MED ORDER — VERAPAMIL HCL 2.5 MG/ML IV SOLN
INTRAVENOUS | Status: AC
  Filled 2022-10-01: qty 2

## 2022-10-01 MED ORDER — SODIUM CHLORIDE 0.9 % IV SOLN: 250.0000 mL | INTRAVENOUS | Status: DC | PRN

## 2022-10-01 MED ORDER — ASPIRIN 81 MG PO CHEW: 81.0000 mg | CHEWABLE_TABLET | Freq: Every day | ORAL | Status: DC

## 2022-10-01 MED ORDER — HEPARIN SODIUM (PORCINE) 1000 UNIT/ML IJ SOLN
INTRAMUSCULAR | Status: AC
  Filled 2022-10-01: qty 10

## 2022-10-01 MED ORDER — ATORVASTATIN CALCIUM 80 MG PO TABS: 80.0000 mg | ORAL_TABLET | Freq: Every day | ORAL | Status: DC

## 2022-10-01 MED ORDER — CLOPIDOGREL BISULFATE 75 MG PO TABS: 75.0000 mg | ORAL_TABLET | Freq: Every day | ORAL | Status: DC

## 2022-10-01 MED ORDER — FENTANYL CITRATE (PF) 100 MCG/2ML IJ SOLN
INTRAMUSCULAR | Status: DC | PRN
  Administered 2022-10-01: 25 ug via INTRAVENOUS

## 2022-10-01 MED ORDER — ACETAMINOPHEN 325 MG PO TABS: 650.0000 mg | ORAL_TABLET | ORAL | Status: DC | PRN

## 2022-10-01 MED ORDER — SODIUM CHLORIDE 0.9 % WEIGHT BASED INFUSION: 1.0000 mL/kg/h | INTRAVENOUS | Status: DC

## 2022-10-01 MED ORDER — HEPARIN SODIUM (PORCINE) 1000 UNIT/ML IJ SOLN
INTRAMUSCULAR | Status: DC | PRN
  Administered 2022-10-01: 5500 [IU] via INTRAVENOUS

## 2022-10-01 MED ORDER — ONDANSETRON HCL 4 MG/2ML IJ SOLN: 4.0000 mg | Freq: Four times a day (QID) | INTRAMUSCULAR | Status: DC | PRN

## 2022-10-01 MED ORDER — FENTANYL CITRATE (PF) 100 MCG/2ML IJ SOLN
INTRAMUSCULAR | Status: AC
  Filled 2022-10-01: qty 2

## 2022-10-01 MED ORDER — MIDAZOLAM HCL 2 MG/2ML IJ SOLN
INTRAMUSCULAR | Status: AC
  Filled 2022-10-01: qty 2

## 2022-10-01 MED ORDER — CLOPIDOGREL BISULFATE 75 MG PO TABS
75.0000 mg | ORAL_TABLET | Freq: Once | ORAL | Status: AC
  Administered 2022-10-01: 75 mg via ORAL
  Filled 2022-10-01: qty 1

## 2022-10-01 MED ORDER — SODIUM CHLORIDE 0.9% FLUSH: 3.0000 mL | Freq: Two times a day (BID) | INTRAVENOUS | Status: DC

## 2022-10-01 MED ORDER — SODIUM CHLORIDE 0.9 % WEIGHT BASED INFUSION
3.0000 mL/kg/h | INTRAVENOUS | Status: DC
  Administered 2022-10-01: 3 mL/kg/h via INTRAVENOUS

## 2022-10-01 MED ORDER — VERAPAMIL HCL 2.5 MG/ML IV SOLN
INTRAVENOUS | Status: DC | PRN
  Administered 2022-10-01 (×2): 10 mL via INTRA_ARTERIAL

## 2022-10-01 MED ORDER — SODIUM CHLORIDE 0.9% FLUSH: 3.0000 mL | INTRAVENOUS | Status: DC | PRN

## 2022-10-01 SURGICAL SUPPLY — 12 items
CATH INFINITI 5 FR JL3.5 (CATHETERS) IMPLANT
CATH INFINITI AMBI 5FR TG (CATHETERS) IMPLANT
CATH INFINITI JR4 5F (CATHETERS) IMPLANT
DEVICE RAD COMP TR BAND LRG (VASCULAR PRODUCTS) IMPLANT
GLIDESHEATH SLEND SS 6F .021 (SHEATH) IMPLANT
GUIDEWIRE INQWIRE 1.5J.035X260 (WIRE) IMPLANT
INQWIRE 1.5J .035X260CM (WIRE) ×2
KIT SINGLE USE MANIFOLD (KITS) IMPLANT
KIT SYRINGE INJ CVI SPIKEX1 (MISCELLANEOUS) IMPLANT
PACK CARDIAC CATHETERIZATION (CUSTOM PROCEDURE TRAY) ×2 IMPLANT
SET ATX-X65L (MISCELLANEOUS) IMPLANT
SHEATH PROBE COVER 6X72 (BAG) IMPLANT

## 2022-10-01 NOTE — Interval H&P Note (Signed)
Cath Lab Visit (complete for each Cath Lab visit)  Clinical Evaluation Leading to the Procedure:   ACS: No.  Non-ACS:    Anginal Classification: CCS II  Anti-ischemic medical therapy: Minimal Therapy (1 class of medications)  Non-Invasive Test Results: No non-invasive testing performed  Prior CABG: No previous CABG      History and Physical Interval Note:  10/01/2022 7:47 AM  Richard [name]  has presented today for surgery, with the diagnosis of cad - angina.  The various methods of treatment have been discussed with the patient and family. After consideration of risks, benefits and other options for treatment, the patient has consented to  Procedure(s): LEFT HEART CATH AND CORONARY ANGIOGRAPHY (N/A) as a surgical intervention.  The patient's history has been reviewed, patient examined, no change in status, stable for surgery.  I have reviewed the patient's chart and labs.  Questions were answered to the patient's satisfaction.     Nicki Guadalajara

## 2022-10-01 NOTE — Discharge Instructions (Signed)
NO METFORMIN FOR 2 DAYS 

## 2022-10-04 ENCOUNTER — Encounter (HOSPITAL_COMMUNITY): Payer: Self-pay | Admitting: Cardiovascular Disease

## 2022-10-14 NOTE — Progress Notes (Signed)
Cardiology Clinic Note   Date: 10/15/2022 ID: SHIHEEM KOLESZAR, DOB [birth year], MRN [id number]  Primary Cardiologist:  Lance Muss, MD  Patient Profile    Richard [name] is a 77 y.o. male who presents to the clinic today for hospital follow up.     Past medical history significant for: CAD. LHC performed at [facility] 04/20/2011: Stent to LAD. LHC 10/08/2012 (unstable angina): Patent proximal LAD stent with 30% in-stent restenosis. LHC 05/22/2018 (unstable angina): Distal LM 20%.  Proximal LAD 75% with in-stent restenosis.  PCI with DES to proximal LAD. Echo 05/21/2018: EF 60 to 65%.  Grade I DD.  Normal RV function.  No significant valve abnormalities. LHC 08/30/2019: Distal LM 20%. Mid LAD 25%. Patent proximal LAD stent.  LHC 10/01/2022 (angina): Ostial LM 30%.  Mid LAD #1 20%, #2 20%.  Mid to distal LM 20%.  Ostial to proximal LAD 20%.  Mildly elevated LVEDP.  Recommend continue long-term DAPT with aspirin/Plavix.  Continue aggressive lipid-lowering with Zetia/atorvastatin and if unable to reach target of <55 consider PCSK9i.  Consider adding low-dose amlodipine or isosorbide.  Chest pain may be noncardiac in etiology. Hypertension.  Hyperlipidemia.  Lipid panel 06/30/2022: LDL 66, HDL 50, TG 162, total 140. OSA. Bladder cancer.  S/p TURBT.       History of Present Illness    Richard [name] is a longtime patient of cardiology.  Stent placed to LAD at [facility] February 2013.  He was first evaluated by Dr. Elease Hashimoto on 10/18/2012 during hospital admission for chest pain. He underwent LHC which showed patent proximal Lad sent with 30% in-stent restenosis. He was not seen again until 05/22/2018 during another hospital admission for chest pain. He underwent PCI with DES to proximal LAD for in-stent restenosis. He continued to be followed by Dr. Eldridge Dace. On 08/24/2019 he was evaluated by Dr. Excell Seltzer as a work in on the DOD schedule for chest pain. He was scheduled for a LHC which showed a  patent proximal LAD stent.   Patient was last seen in the office by me on 09/29/2022 as an add-on for 48-month history of central chest pain occurring with and without exertion.  Pain somewhat improved with NTG and associated with shortness of breath.  Similar to prior anginal pain.  LHC showed mild residual CAD and medical therapy was recommended.  Today, patient is accompanied by his wife. He reports continued episodes of chest pain but not as frequent as before cath. He is still able to do his normal activities. He remains fatigued. He does not follow a regular exercise routine and does not eat a heart healthy diet. His activity is limited by hip pain. Had a long discussion regarding deconditioning and increasing physical activity as tolerated. Also discussed following a better diet.       ROS: All other systems reviewed and are otherwise negative except as noted in History of Present Illness.  Studies Reviewed      EKG is not ordered today.          Physical Exam    VS:  BP 128/62   Pulse 60   Ht 5\' 8"  (1.727 m)   Wt 248 lb 12.8 oz (112.9 kg)   SpO2 95%   BMI 37.83 kg/m  , BMI Body mass index is 37.83 kg/m.  GEN: Well nourished, well developed, in no acute distress. Neck: No JVD or carotid bruits. Cardiac:  RRR. No murmurs. No rubs or gallops.   Respiratory:  Respirations  regular and unlabored. Clear to auscultation without rales, wheezing or rhonchi. GI: Soft, nontender, nondistended. Extremities: Radials/DP/PT 2+ and equal bilaterally. No clubbing or cyanosis. No edema.  Skin: Warm and dry, no rash. Neuro: Strength intact.  Assessment & Plan    CAD/angina. S/p proximal LAD stent February 2013 with in-stent restenosis and PCI with DES March 2020. Last [facility] July 2024 showed mild residual CAD. Patient reports continued episodes of chest pain but less frequent than prior to cath. Will add isosorbide 15 mg daily. He will contact the office if he is not tolerating the medication.  Would consider changing to low dose amlodipine if he cannot tolerate isosorbide. Continue aspirin, Plavix, atorvastatin, Zetia, metoprolol, prn SL NTG.  Hypertension: BP today 128/62. Marland Kitchen Patient denies headaches, dizziness or vision changes. Continue metoprolol, lisinopril.  Hyperlipidemia. LDL April 2024 66, not at goal. Continue atorvastatin and Zetia.   Disposition: Isosorbide 15 mg daily. Patient would like to follow up in 2 months.          Signed, Etta Grandchild. , DNP, NP-C

## 2022-10-15 ENCOUNTER — Telehealth: Payer: Self-pay | Admitting: Interventional Cardiology

## 2022-10-15 ENCOUNTER — Ambulatory Visit: Payer: PPO | Admitting: Student

## 2022-10-15 ENCOUNTER — Encounter: Payer: Self-pay | Admitting: Student

## 2022-10-15 VITALS — BP 128/62 | HR 60 | Ht 68.0 in | Wt 248.8 lb

## 2022-10-15 DIAGNOSIS — I25118 Atherosclerotic heart disease of native coronary artery with other forms of angina pectoris: Secondary | ICD-10-CM

## 2022-10-15 DIAGNOSIS — E785 Hyperlipidemia, unspecified: Secondary | ICD-10-CM | POA: Diagnosis not present

## 2022-10-15 DIAGNOSIS — I10 Essential (primary) hypertension: Secondary | ICD-10-CM | POA: Diagnosis not present

## 2022-10-15 MED ORDER — ISOSORBIDE MONONITRATE ER 30 MG PO TB24: 15.0000 mg | ORAL_TABLET | Freq: Every day | ORAL | 3 refills | Status: DC

## 2022-10-15 MED ORDER — EZETIMIBE 10 MG PO TABS: 10.0000 mg | ORAL_TABLET | Freq: Every day | ORAL | 3 refills | Status: DC

## 2022-10-15 NOTE — Telephone Encounter (Signed)
I spoke with patient who gave me permission to speak with Eber Jones.  Patient had office visit today and they were to call back regarding Zetia.   Eber Jones reports patient has not been taking Zetia but will resume.  Patient does not have this medication at home.  I let her know prescription was sent to [facility] in [place] [facility] today

## 2022-10-15 NOTE — Telephone Encounter (Signed)
Pt c/o medication issue:  1. Name of Medication:   ezetimibe (ZETIA) 10 MG tablet   2. How are you currently taking this medication (dosage and times per day)?   3. Are you having a reaction (difficulty breathing--STAT)?   4. What is your medication issue?   Wife called stating she will start patient back on this medication.

## 2022-10-15 NOTE — Patient Instructions (Signed)
Medication Instructions:  BEGIN THE ISOSORBIDE MONONITRATE 30MG . TAKE ONE HALF TABLET DAILY *If you need a refill on your cardiac medications before your next appointment, please call your pharmacy*   Lab Work: NONE If you have labs (blood work) drawn today and your tests are completely normal, you will receive your results only by: MyChart Message (if you have MyChart) OR A paper copy in the mail If you have any lab test that is abnormal or we need to change your treatment, we will call you to review the results.   Testing/Procedures: NONE   Follow-Up: At [facility], you and your health needs are our priority.  As part of our continuing mission to provide you with exceptional heart care, we have created designated Provider Care Teams.  These Care Teams include your primary Cardiologist (physician) and Advanced Practice Providers (APPs -  Physician Assistants and Nurse Practitioners) who all work together to provide you with the care you need, when you need it.  We recommend signing up for the patient portal called "MyChart".  Sign up information is provided on this After Visit Summary.  MyChart is used to connect with patients for Virtual Visits (Telemedicine).  Patients are able to view lab/test results, encounter notes, upcoming appointments, etc.  Non-urgent messages can be sent to your provider as well.   To learn more about what you can do with MyChart, go to [email or web address].    Your next appointment:   12/07/2022 AT 8:25   Provider:   Carlos Levering, NP

## 2022-11-03 ENCOUNTER — Other Ambulatory Visit: Payer: Self-pay | Admitting: Family Medicine

## 2022-11-25 ENCOUNTER — Encounter (HOSPITAL_BASED_OUTPATIENT_CLINIC_OR_DEPARTMENT_OTHER): Payer: Self-pay

## 2022-12-04 NOTE — Progress Notes (Unsigned)
Cardiology Clinic Note   Date: 12/04/2022 ID: Richard [name], DOB [birth year], MRN [id number]  Primary Cardiologist:  Lance Muss, MD  Patient Profile    Richard [name] is a 77 y.o. male who presents to the clinic today for ***    Past medical history significant for: CAD. LHC performed at [facility] 04/20/2011: Stent to LAD. LHC 10/08/2012 (unstable angina): Patent proximal LAD stent with 30% in-stent restenosis. LHC 05/22/2018 (unstable angina): Distal LM 20%.  Proximal LAD 75% with in-stent restenosis.  PCI with DES to proximal LAD. Echo 05/21/2018: EF 60 to 65%.  Grade I DD.  Normal RV function.  No significant valve abnormalities. LHC 08/30/2019: Distal LM 20%. Mid LAD 25%. Patent proximal LAD stent.  LHC 10/01/2022 (angina): Ostial LM 30%.  Mid LAD #1 20%, #2 20%.  Mid to distal LM 20%.  Ostial to proximal LAD 20%.  Mildly elevated LVEDP.  Recommend continue long-term DAPT with aspirin/Plavix.  Continue aggressive lipid-lowering with Zetia/atorvastatin and if unable to reach target of <55 consider PCSK9i.  Consider adding low-dose amlodipine or isosorbide.  Chest pain may be noncardiac in etiology. Hypertension.  Hyperlipidemia.  Lipid panel 06/30/2022: LDL 66, HDL 50, TG 162, total 140. OSA. Bladder cancer.  S/p TURBT.        History of Present Illness    Richard [name] is a longtime patient of cardiology. Stent placed to LAD at [facility] February 2013. He was first evaluated by Dr. Elease Hashimoto on 10/18/2012 during hospital admission for chest pain. He underwent LHC which showed patent proximal Lad sent with 30% in-stent restenosis. He was not seen again until 05/22/2018 during another hospital admission for chest pain. He underwent PCI with DES to proximal LAD for in-stent restenosis. He continued to be followed by Dr. Eldridge Dace. On 08/24/2019 he was evaluated by Dr. Excell Seltzer as a work in on the DOD schedule for chest pain. He was scheduled for a LHC which showed a patent proximal  LAD stent.   Patient was seen in the office on 09/29/2022 as an add-on for 82-month history of central chest pain occurring with and without exertion and improving somewhat with NTG.  He underwent LHC which showed mild residual CAD and medical therapy was recommended.  He was seen in follow-up in August 2024 and reported continued episodes of chest pain but not as frequent as prior to cath.  He continued to complain of fatigue.  He reported not following a regular exercise routine or eating a heart healthy diet.  He was started on isosorbide and instructed to begin low impact physical activity.  Today, patient ***  Amlodipine?   CAD/angina. S/p proximal LAD stent February 2013 with in-stent restenosis and PCI with DES March 2020. Last [facility] July 2024 showed mild residual CAD. Patient reports continued episodes of chest pain but less frequent than prior to cath.  Patient***. Continue aspirin, Plavix, atorvastatin, Zetia, metoprolol, isosorbide, prn SL NTG.*** Hypertension: BP today***. Marland Kitchen Patient denies headaches, dizziness or vision changes. Continue metoprolol, lisinopril.  Hyperlipidemia. LDL April 2024 66, not at goal. Continue atorvastatin and Zetia.    ROS: All other systems reviewed and are otherwise negative except as noted in History of Present Illness.  Studies Reviewed       ***  Risk Assessment/Calculations    {Does this patient have ATRIAL FIBRILLATION?:[phone]} No BP recorded.  {Refresh Note OR Click here to enter BP  :1}***        Physical Exam    VS:  There were no vitals taken for this visit. , BMI There is no height or weight on file to calculate BMI.  GEN: Well nourished, well developed, in no acute distress. Neck: No JVD or carotid bruits. Cardiac: *** RRR. No murmurs. No rubs or gallops.   Respiratory:  Respirations regular and unlabored. Clear to auscultation without rales, wheezing or rhonchi. GI: Soft, nontender, nondistended. Extremities: Radials/DP/PT 2+ and  equal bilaterally. No clubbing or cyanosis. No edema ***  Skin: Warm and dry, no rash. Neuro: Strength intact.  Assessment & Plan   ***  Disposition: ***     {Are you ordering a CV Procedure (e.g. stress test, cath, DCCV, TEE, etc)?   Press F2        :[id number]}   Signed, Etta Grandchild. Shardee Dieu, DNP, NP-C

## 2022-12-07 ENCOUNTER — Ambulatory Visit: Payer: PPO | Attending: Student | Admitting: Student

## 2022-12-07 ENCOUNTER — Encounter: Payer: Self-pay | Admitting: Student

## 2022-12-07 VITALS — BP 124/70 | HR 60 | Ht 68.0 in | Wt 252.8 lb

## 2022-12-07 DIAGNOSIS — I2510 Atherosclerotic heart disease of native coronary artery without angina pectoris: Secondary | ICD-10-CM | POA: Diagnosis not present

## 2022-12-07 DIAGNOSIS — E782 Mixed hyperlipidemia: Secondary | ICD-10-CM | POA: Diagnosis not present

## 2022-12-07 DIAGNOSIS — I10 Essential (primary) hypertension: Secondary | ICD-10-CM | POA: Diagnosis not present

## 2022-12-07 DIAGNOSIS — R0609 Other forms of dyspnea: Secondary | ICD-10-CM | POA: Diagnosis not present

## 2022-12-07 MED ORDER — NITROGLYCERIN 0.4 MG SL SUBL: SUBLINGUAL_TABLET | SUBLINGUAL | 3 refills | Status: AC

## 2022-12-07 MED ORDER — LISINOPRIL 40 MG PO TABS: ORAL_TABLET | ORAL | 3 refills | Status: DC

## 2022-12-07 MED ORDER — ISOSORBIDE MONONITRATE ER 30 MG PO TB24: 15.0000 mg | ORAL_TABLET | Freq: Every day | ORAL | 3 refills | Status: DC

## 2022-12-07 NOTE — Patient Instructions (Signed)
Medication Instructions:  No Changes *If you need a refill on your cardiac medications before your next appointment, please call your pharmacy*   Lab Work: No Labs   Testing/Procedures: No testing   Follow-Up: At [facility], you and your health needs are our priority.  As part of our continuing mission to provide you with exceptional heart care, we have created designated Provider Care Teams.  These Care Teams include your primary Cardiologist (physician) and Advanced Practice Providers (APPs -  Physician Assistants and Nurse Practitioners) who all work together to provide you with the care you need, when you need it.  We recommend signing up for the patient portal called "MyChart".  Sign up information is provided on this After Visit Summary.  MyChart is used to connect with patients for Virtual Visits (Telemedicine).  Patients are able to view lab/test results, encounter notes, upcoming appointments, etc.  Non-urgent messages can be sent to your provider as well.   To learn more about what you can do with MyChart, go to [email or web address].    Your next appointment:   6 month(s)  Provider:   Marjie Skiff, PA-C

## 2022-12-09 DIAGNOSIS — D485 Neoplasm of uncertain behavior of skin: Secondary | ICD-10-CM | POA: Diagnosis not present

## 2022-12-09 DIAGNOSIS — L720 Epidermal cyst: Secondary | ICD-10-CM | POA: Diagnosis not present

## 2022-12-23 DIAGNOSIS — L720 Epidermal cyst: Secondary | ICD-10-CM | POA: Diagnosis not present

## 2023-02-08 DIAGNOSIS — E119 Type 2 diabetes mellitus without complications: Secondary | ICD-10-CM | POA: Diagnosis not present

## 2023-03-06 ENCOUNTER — Other Ambulatory Visit: Payer: Self-pay | Admitting: Family Medicine

## 2023-04-04 DIAGNOSIS — C678 Malignant neoplasm of overlapping sites of bladder: Secondary | ICD-10-CM | POA: Diagnosis not present

## 2023-05-02 ENCOUNTER — Other Ambulatory Visit: Payer: Self-pay | Admitting: Family Medicine

## 2023-05-07 ENCOUNTER — Other Ambulatory Visit: Payer: Self-pay | Admitting: Student

## 2023-05-07 ENCOUNTER — Other Ambulatory Visit: Payer: Self-pay | Admitting: Family Medicine

## 2023-06-01 NOTE — Progress Notes (Signed)
 Cardiology Office Note:  .   Date:  06/07/2023  ID:  Richard [name], DOB 20-Dec-1945, MRN [id number] PCP: Richard Massed, MD  Richard [name]:  Richard Muss, MD    History of Present Illness: .   Richard [name] is a 78 y.o. male with a past medical history of CAD, HTN, HLD, OSA, history of bladder cancer. Patient was previously followed by Richard [name], has not yet established care with a new [name]. Presents today for a 6 month follow up appointment   Patient previously underwent LHC and received stent to the LAD in 04/2011. In 10/2012, patient had unstable angina and underwent repeat cath that showed patent proximal LAD stent with 30% in-stent restenosis. Later, patient again developed unstable angina in 05/2018 and underwent LHC that showed 20% distal LM, 75% in-stent restenosis in the proximal LAD. Was treated with DES to the proximal LAD. Repeat cath in 08/2019 showed 20% distal LM, 25% mid LAD disease, and patent proximal LAD stent.   More recently, patient underwent LHC on 10/01/22 for evaluation of unstable angina. Cath showed overall mild, nonobstructive residual CAD with smooth 30% ostial and 20% distal left main stenosis, patent proximal LAD stent with mild 20% residual narrowing, and 20% smooth mid LAD stenoses. Recommended long-term DAPT with ASA/plavix, aggressive lipid-lowering therapy.   Patient was last seen by cardiology on 12/07/22. At that time, patient was doing well. Denied chest pain, pressure, tightness. Had some DOE on heavier exertion. Remained on ASA, plavix, lipitor, zetia, metoprolol, imdur.   Today, patient presents for a 6 month follow up appointment. Reports that he is overall doing well and denies specific concerns or complaints today. He does have a bit of shortness of breath, particularly when climbing stairs. Denies shortness of breath otherwise. He is able to mow his law and do yard work and has to occasionally take breaks to catch  his breath. He reports rare chest pain, nothing similar to what he had prior to needing heart caths in the past. The chest pain is described as infrequent, occurring "once in a blue moon," and does not interfere with the patient's daily activities. The patient acknowledges that he is not as physically capable as he was when he was younger, but he is still able to perform tasks such as mowing the yard and running a chainsaw.   The patient also reports experiencing allergies and a post-nasal drip, which he manages with Tylenol. He notes that he does not take any allergy medications, as they make him feel worn out. The patient's spouse mentions that the patient snores, but the patient denies this and has refused to use a CPAP machine in the past due to discomfort.  ROS: Denies chest pain, palpitations, dizziness, syncope, near syncope, lower extremity swelling. Gets short of breath with vigorous activities   Studies Reviewed: .   Cardiac Studies & Procedures   ______________________________________________________________________________________________ CARDIAC CATHETERIZATION  CARDIAC CATHETERIZATION 10/01/2022  Narrative   Ost LM lesion is 30% stenosed.   Mid LAD-1 lesion is 20% stenosed.   Mid LAD-2 lesion is 20% stenosed.   Mid LM to Dist LM lesion is 20% stenosed.   Ost LAD to Prox LAD lesion is 20% stenosed.   LV end diastolic pressure is mildly elevated.   The left ventricular ejection fraction is 50-55% by visual estimate.   Recommend dual antiplatelet therapy.   He has continued to be on long-term DAPT with aspirin/clopidogrel.  Mild nonobstructive residual CAD with smooth  30% ostial and 20% distal left main stenoses; patent proximal LAD stent with mild 20% residual narrowing, and 20% smooth mid LAD stenoses.  Very large caliber normal ramus intermediate vessel, normal left circumflex coronary artery, and normal dominant RCA.  Normal global LV contractility with minimal mid  anterolateral hypocontractility.  EF estimate approximately 55%.  LVEDP 23 mmHg.  RECOMMENDATION: Continue long-term DAPT with aspirin/clopidogrel.  Continue aggressive lipid-lowering therapy with Zetia/atorvastatin 80 mg and if unable to reach target less than 55 consider PCSK9 inhibition.  Medical therapy for mild residual CAD.  Consider adding low-dose amlodipine or isosorbide.  Chest pain may be noncardiac in etiology.  Findings Coronary Findings Diagnostic  Dominance: Right  Left Main Ost LM lesion is 30% stenosed. Mid LM to Dist LM lesion is 20% stenosed.  Left Anterior Descending Ost LAD to Prox LAD lesion is 20% stenosed. The lesion was previously treated . Mid LAD-1 lesion is 20% stenosed. Mid LAD-2 lesion is 20% stenosed.  First Diagonal Branch  Ramus Intermedius Vessel is large.  Intervention  No interventions have been documented.   CARDIAC CATHETERIZATION  CARDIAC CATHETERIZATION 08/30/2019  Narrative  Dist LM lesion is 20% stenosed.  Mid LAD lesion is 25% stenosed.  Previously placed Prox LAD drug eluting stent is widely patent.  The left ventricular systolic function is normal.  LV end diastolic pressure is normal.  The left ventricular ejection fraction is 55-65% by visual estimate.  There is no aortic valve stenosis.  Patent stents.  Nonobstructive disease.  No change from end of 2020 cath.  Continue medical therapy with nitrates.  If symptoms persist, could also consider verapamil as an antianginal to treat potential microvascular disease.  Findings Coronary Findings Diagnostic  Dominance: Right  Left Main Dist LM lesion is 20% stenosed.  Left Anterior Descending Previously placed Prox LAD drug eluting stent is widely patent. Mid LAD lesion is 25% stenosed.  Intervention  No interventions have been documented.     ECHOCARDIOGRAM  ECHOCARDIOGRAM COMPLETE 05/21/2018  Narrative ECHOCARDIOGRAM REPORT    Patient Name:   Richard [name] Date of Exam: 05/21/2018 Medical Rec #:  [id number]     Height:       68.0 in Accession #:    [id number]    Weight:       252.5 lb Date of Birth:  1946-03-07    BSA:          2.26 m Patient Age:    72 years      BP:           84/53 mmHg Patient Gender: M             HR:           85 bpm. Exam Location:  Inpatient   Procedure: 2D Echo and Intracardiac Opacification Agent  Indications:    CAD native vessel 414.01  History:        Patient has no prior history of Echocardiogram examinations. Signs/Symptoms: Chest Pain; Risk Factors: Hypertension and Dyslipidemia.  Sonographer:    Delcie Roch Referring Phys: 16 SURESH A KONESWARAN   Sonographer Comments: Technically difficult study due to poor echo windows. IMPRESSIONS   1. The left ventricle has normal systolic function with an ejection fraction of 60-65%. The cavity size was normal. Left ventricular diastolic Doppler parameters are consistent with impaired relaxation. No evidence of left ventricular regional wall motion abnormalities. 2. The right ventricle has normal systolic function. The cavity was normal. There is no increase  in right ventricular wall thickness. Right ventricular systolic pressure could not be assessed. 3. No hemodynamically significant valve disease.  FINDINGS Left Ventricle: The left ventricle has normal systolic function, with an ejection fraction of 60-65%. The cavity size was normal. There is no increase in left ventricular wall thickness. Left ventricular diastolic Doppler parameters are consistent with impaired relaxation. No evidence of left ventricular regional wall motion abnormalities.. Definity contrast agent was given IV to delineate the left ventricular endocardial borders. Right Ventricle: The right ventricle has normal systolic function. The cavity was normal. There is no increase in right ventricular wall thickness. Right ventricular systolic pressure could not be assessed. Left  Atrium: left atrial size was normal in size Right Atrium: right atrial size was normal in size. Interatrial Septum: No atrial level shunt detected by color flow Doppler. Pericardium: There is no evidence of pericardial effusion. Mitral Valve: The mitral valve is normal in structure. Mitral valve regurgitation is not visualized by color flow Doppler. Tricuspid Valve: The tricuspid valve is normal in structure. Tricuspid valve regurgitation is trivial by color flow Doppler. Aortic Valve: The aortic valve is normal in structure. Aortic valve regurgitation is trivial by color flow Doppler. Pulmonic Valve: The pulmonic valve was normal in structure. Pulmonic valve regurgitation was not assessed by color flow Doppler. Venous: The inferior vena cava was not well visualized.  LEFT VENTRICLE PLAX 2D LVIDd:         3.90 cm Diastology LVIDs:         2.60 cm LV e' lateral:   7.72 cm/s LV PW:         1.10 cm LV E/e' lateral: 5.5 LV IVS:        0.90 cm LV e' medial:    6.53 cm/s LV SV:         41 ml   LV E/e' medial:  6.5 LV SV Index:   17.25  RIGHT VENTRICLE RV S prime:     9.36 cm/s TAPSE (M-mode): 1.7 cm  LEFT ATRIUM             Index       RIGHT ATRIUM           Index LA diam:        3.80 cm 1.68 cm/m  RA Area:     16.50 cm LA Vol (A2C):   26.6 ml 11.79 ml/m RA Volume:   42.40 ml  18.79 ml/m LA Vol (A4C):   41.2 ml 18.26 ml/m LA Biplane Vol: 33.9 ml 15.02 ml/m AORTIC VALVE LVOT Vmax:   79.80 cm/s LVOT Vmean:  50.400 cm/s LVOT VTI:    0.133 m  MV E velocity: 42.70 cm/s MV A velocity: 64.00 cm/s SHUNTS MV E/A ratio:  0.67       Systemic VTI: 0.13 m   Weston Brass MD Electronically signed by Weston Brass MD Signature Date/Time: 05/21/2018/3:47:05 PM    Final          ______________________________________________________________________________________________      Risk Assessment/Calculations:             Physical Exam:   VS:  BP 112/62 (BP Location: Right  Arm, Patient Position: Sitting, Cuff Size: Large)   Pulse 61   Ht 5\' 10"  (1.778 m)   Wt 257 lb (116.6 kg)   SpO2 92%   BMI 36.88 kg/m    Wt Readings from Last 3 Encounters:  06/07/23 257 lb (116.6 kg)  12/07/22 252 lb 12.8 oz (114.7 kg)  10/15/22 248 lb 12.8 oz (112.9 kg)    GEN: Well nourished, well developed in no acute distress. Sitting comfortably on the exam table  NECK: No JVD  CARDIAC: RRR, no murmurs, rubs, gallops. Radial pulses 2+ bilaterally  RESPIRATORY:  Clear to auscultation without rales, wheezing or rhonchi  ABDOMEN: Soft, non-tender, non-distended EXTREMITIES:  No edema in BLE; No deformity   ASSESSMENT AND PLAN: .    CAD  - Most recent cath in 09/2022 showed overall mild, nonobstructive residual CAD with smooth 30% ostial and 20% distal left main stenosis, patent proximal LAD stent with mild 20% residual narrowing, and 20% smooth mid LAD stenoses - Patient denies chest pain on exertion. Gets short of breath with vigorous activities, but no shortness of breath otherwise  - EKG today nonischemic  - Continue aspirin 81 mg daily, plavix 75 mg daily - on long term DAPT - Continue lipitor 80 mg daily and zetia 10 mg daily  - Continue metoprolol succinate 100 mg daily, imdur 15 mg daily   HTN  - BP well controlled. Denies symptoms of orthostatic hypotension  - Continue imdur 15 mg daily, metoprolol succinate 100 mg daily, lisinopril 20 mg daily, lasix 40 mg daily  - Ordered CMP for medication monitoring   HLD  - Lipid panel from 06/2022 showed LDL 66, HDL 49, triglycerides 162, total cholesterol 148  - Ordered repeat lipid panel, CMP today. He is not fasting, will return for labs  - Continue lipitor 80 mg daily, zetia 10 mg daily   OSA  - Patient was unable to tolerate CPAP in the past, is not interested in trying other forms of CPAP face mask at this time    Dispo: Follow up with Dr. Herbie Baltimore to establish care in 6-8 months.  Signed, Jonita Albee, PA-C

## 2023-06-07 ENCOUNTER — Encounter: Payer: Self-pay | Admitting: Cardiology

## 2023-06-07 ENCOUNTER — Ambulatory Visit: Payer: PPO | Attending: Cardiology | Admitting: Cardiology

## 2023-06-07 VITALS — BP 112/62 | HR 61 | Ht 70.0 in | Wt 257.0 lb

## 2023-06-07 DIAGNOSIS — I2510 Atherosclerotic heart disease of native coronary artery without angina pectoris: Secondary | ICD-10-CM

## 2023-06-07 DIAGNOSIS — G4733 Obstructive sleep apnea (adult) (pediatric): Secondary | ICD-10-CM

## 2023-06-07 DIAGNOSIS — E785 Hyperlipidemia, unspecified: Secondary | ICD-10-CM | POA: Diagnosis not present

## 2023-06-07 DIAGNOSIS — E782 Mixed hyperlipidemia: Secondary | ICD-10-CM

## 2023-06-07 NOTE — Patient Instructions (Signed)
 Medication Instructions:  Continue current medication *If you need a refill on your cardiac medications before your next appointment, please call your pharmacy*  Lab Work: Lipid panel, CMP If you have labs (blood work) drawn today and your tests are completely normal, you will receive your results only by: MyChart Message (if you have MyChart) OR A paper copy in the mail If you have any lab test that is abnormal or we need to change your treatment, we will call you to review the results.  Testing/Procedures: none  Follow-Up: At [facility], you and your health needs are our priority.  As part of our continuing mission to provide you with exceptional heart care, our providers are all part of one team.  This team includes your primary Cardiologist (physician) and Advanced Practice Providers or APPs (Physician Assistants and Nurse Practitioners) who all work together to provide you with the care you need, when you need it.  Your next appointment:   6 month(s)  Provider:   Dr. Herbie Baltimore to reestablish care  We recommend signing up for the patient portal called "MyChart".  Sign up information is provided on this After Visit Summary.  MyChart is used to connect with patients for Virtual Visits (Telemedicine).  Patients are able to view lab/test results, encounter notes, upcoming appointments, etc.  Non-urgent messages can be sent to your provider as well.   To learn more about what you can do with MyChart, go to [email or web address].   Other Instructions none      1st Floor: - Lobby - Registration  - Pharmacy  - Lab - Cafe  2nd Floor: - PV Lab - Diagnostic Testing (echo, CT, nuclear med)  3rd Floor: - Vacant  4th Floor: - TCTS (cardiothoracic surgery) - AFib Clinic - Structural Heart Clinic - Vascular Surgery  - Vascular Ultrasound  5th Floor: - [facility] (general and EP) - Clinical Pharmacy for coumadin, hypertension, lipid, weight-loss  medications, and med management appointments    Valet parking services will be available as well.

## 2023-06-08 ENCOUNTER — Other Ambulatory Visit: Payer: Self-pay | Admitting: Family Medicine

## 2023-06-08 ENCOUNTER — Ambulatory Visit (INDEPENDENT_AMBULATORY_CARE_PROVIDER_SITE_OTHER): Admitting: *Deleted

## 2023-06-08 DIAGNOSIS — Z0000 Encounter for general adult medical examination without abnormal findings: Secondary | ICD-10-CM | POA: Diagnosis not present

## 2023-06-08 NOTE — Patient Instructions (Signed)
 [name] , Thank you for taking time to come for your Medicare Wellness Visit. I appreciate your ongoing commitment to your health goals. Please review the following plan we discussed and let me know if I can assist you in the future.   Screening recommendations/referrals: Colonoscopy: no longer required Recommended yearly ophthalmology/optometry visit for glaucoma screening and checkup Recommended yearly dental visit for hygiene and checkup  Vaccinations: Influenza vaccine: up to date Pneumococcal vaccine: Education provided Tdap vaccine: Education provided Shingles vaccine: Education provided    Advanced directives: Education provided    Preventive Care 65 Years and Older, Male Preventive care refers to lifestyle choices and visits with your health care provider that can promote health and wellness. What does preventive care include? A yearly physical exam. This is also called an annual well check. Dental exams once or twice a year. Routine eye exams. Ask your health care provider how often you should have your eyes checked. Personal lifestyle choices, including: Daily care of your teeth and gums. Regular physical activity. Eating a healthy diet. Avoiding tobacco and drug use. Limiting alcohol use. Practicing safe sex. Taking low doses of aspirin every day. Taking vitamin and mineral supplements as recommended by your health care provider. What happens during an annual well check? The services and screenings done by your health care provider during your annual well check will depend on your age, overall health, lifestyle risk factors, and family history of disease. Counseling  Your health care provider may ask you questions about your: Alcohol use. Tobacco use. Drug use. Emotional well-being. Home and relationship well-being. Sexual activity. Eating habits. History of falls. Memory and ability to understand (cognition). Work and work Astronomer. Screening  You may  have the following tests or measurements: Height, weight, and BMI. Blood pressure. Lipid and cholesterol levels. These may be checked every 5 years, or more frequently if you are over 79 years old. Skin check. Lung cancer screening. You may have this screening every year starting at age 67 if you have a 30-pack-year history of smoking and currently smoke or have quit within the past 15 years. Fecal occult blood test (FOBT) of the stool. You may have this test every year starting at age 61. Flexible sigmoidoscopy or colonoscopy. You may have a sigmoidoscopy every 5 years or a colonoscopy every 10 years starting at age 58. Prostate cancer screening. Recommendations will vary depending on your family history and other risks. Hepatitis C blood test. Hepatitis B blood test. Sexually transmitted disease (STD) testing. Diabetes screening. This is done by checking your blood sugar (glucose) after you have not eaten for a while (fasting). You may have this done every 1-3 years. Abdominal aortic aneurysm (AAA) screening. You may need this if you are a current or former smoker. Osteoporosis. You may be screened starting at age 42 if you are at high risk. Talk with your health care provider about your test results, treatment options, and if necessary, the need for more tests. Vaccines  Your health care provider may recommend certain vaccines, such as: Influenza vaccine. This is recommended every year. Tetanus, diphtheria, and acellular pertussis (Tdap, Td) vaccine. You may need a Td booster every 10 years. Zoster vaccine. You may need this after age 49. Pneumococcal 13-valent conjugate (PCV13) vaccine. One dose is recommended after age 62. Pneumococcal polysaccharide (PPSV23) vaccine. One dose is recommended after age 49. Talk to your health care provider about which screenings and vaccines you need and how often you need them. This information is  not intended to replace advice given to you by your  health care provider. Make sure you discuss any questions you have with your health care provider. Document Released: 03/21/2015 Document Revised: 11/12/2015 Document Reviewed: 12/24/2014 Elsevier Interactive Patient Education  2017 [employer].  Fall Prevention in the Home Falls can cause injuries. They can happen to people of all ages. There are many things you can do to make your home safe and to help prevent falls. What can I do on the outside of my home? Regularly fix the edges of walkways and driveways and fix any cracks. Remove anything that might make you trip as you walk through a door, such as a raised step or threshold. Trim any bushes or trees on the path to your home. Use bright outdoor lighting. Clear any walking paths of anything that might make someone trip, such as rocks or tools. Regularly check to see if handrails are loose or broken. Make sure that both sides of any steps have handrails. Any raised decks and porches should have guardrails on the edges. Have any leaves, snow, or ice cleared regularly. Use sand or salt on walking paths during winter. Clean up any spills in your garage right away. This includes oil or grease spills. What can I do in the bathroom? Use night lights. Install grab bars by the toilet and in the tub and shower. Do not use towel bars as grab bars. Use non-skid mats or decals in the tub or shower. If you need to sit down in the shower, use a plastic, non-slip stool. Keep the floor dry. Clean up any water that spills on the floor as soon as it happens. Remove soap buildup in the tub or shower regularly. Attach bath mats securely with double-sided non-slip rug tape. Do not have throw rugs and other things on the floor that can make you trip. What can I do in the bedroom? Use night lights. Make sure that you have a light by your bed that is easy to reach. Do not use any sheets or blankets that are too big for your bed. They should not hang down  onto the floor. Have a firm chair that has side arms. You can use this for support while you get dressed. Do not have throw rugs and other things on the floor that can make you trip. What can I do in the kitchen? Clean up any spills right away. Avoid walking on wet floors. Keep items that you use a lot in easy-to-reach places. If you need to reach something above you, use a strong step stool that has a grab bar. Keep electrical cords out of the way. Do not use floor polish or wax that makes floors slippery. If you must use wax, use non-skid floor wax. Do not have throw rugs and other things on the floor that can make you trip. What can I do with my stairs? Do not leave any items on the stairs. Make sure that there are handrails on both sides of the stairs and use them. Fix handrails that are broken or loose. Make sure that handrails are as long as the stairways. Check any carpeting to make sure that it is firmly attached to the stairs. Fix any carpet that is loose or worn. Avoid having throw rugs at the top or bottom of the stairs. If you do have throw rugs, attach them to the floor with carpet tape. Make sure that you have a light switch at the top of the  stairs and the bottom of the stairs. If you do not have them, ask someone to add them for you. What else can I do to help prevent falls? Wear shoes that: Do not have high heels. Have rubber bottoms. Are comfortable and fit you well. Are closed at the toe. Do not wear sandals. If you use a stepladder: Make sure that it is fully opened. Do not climb a closed stepladder. Make sure that both sides of the stepladder are locked into place. Ask someone to hold it for you, if possible. Clearly mark and make sure that you can see: Any grab bars or handrails. First and last steps. Where the edge of each step is. Use tools that help you move around (mobility aids) if they are needed. These include: Canes. Walkers. Scooters. Crutches. Turn  on the lights when you go into a dark area. Replace any light bulbs as soon as they burn out. Set up your furniture so you have a clear path. Avoid moving your furniture around. If any of your floors are uneven, fix them. If there are any pets around you, be aware of where they are. Review your medicines with your doctor. Some medicines can make you feel dizzy. This can increase your chance of falling. Ask your doctor what other things that you can do to help prevent falls. This information is not intended to replace advice given to you by your health care provider. Make sure you discuss any questions you have with your health care provider. Document Released: 12/19/2008 Document Revised: 07/31/2015 Document Reviewed: 03/29/2014 Elsevier Interactive Patient Education  2017 [employer].

## 2023-06-08 NOTE — Progress Notes (Signed)
 Subjective:   Richard [name] is a 78 y.o. male who presents for Medicare Annual/Subsequent preventive examination.  Visit Complete: Virtual I connected with  [name] on 06/08/23 by a audio enabled telemedicine application and verified that I am speaking with the correct person using two identifiers.  Patient Location: Home  Provider Location: Home Office  I discussed the limitations of evaluation and management by telemedicine. The patient expressed understanding and agreed to proceed.  Vital Signs: Because this visit was a virtual/telehealth visit, some criteria may be missing or patient reported. Any vitals not documented were not able to be obtained and vitals that have been documented are patient reported.    Cardiac Risk Factors include: advanced age (>38men, >69 women);diabetes mellitus;male gender;hypertension;obesity (BMI >30kg/m2)     Objective:    There were no vitals filed for this visit. There is no height or weight on file to calculate BMI.     06/08/2023    9:03 AM 06/08/2023    8:51 AM 10/01/2022    5:49 AM 07/07/2022    3:38 PM 08/29/2021    9:54 AM 04/22/2020    8:00 AM 08/30/2019    6:09 AM  Advanced Directives  Does Patient Have a Medical Advance Directive? Yes Yes Yes Yes No No Yes  Type of Sales promotion account executive of [employer] Power of [place];Living will Healthcare Power of [place];Living will   Healthcare Power of [place];Living will  Does patient want to make changes to medical advance directive?      Yes (MAU/Ambulatory/Procedural Areas - Information given)   Copy of Healthcare Power of Attorney in Chart? No - copy requested No - copy requested No - copy requested No - copy requested     Would patient like information on creating a medical advance directive?     No - Patient declined      Current Medications (verified) Outpatient Encounter Medications as of 06/08/2023  Medication Sig   acetaminophen  (TYLENOL) 500 MG tablet Take 1,000 mg by mouth every 6 (six) hours as needed for mild pain or moderate pain (Sinus).   aspirin EC 81 MG EC tablet Take 1 tablet (81 mg total) by mouth daily.   atorvastatin (LIPITOR) 80 MG tablet TAKE 1 TABLET (80 MG TOTAL) BY MOUTH DAILY. TAKE 1 TABLET BY MOUTH DAILY AT 6 PM.   clopidogrel (PLAVIX) 75 MG tablet Take 1 tablet (75 mg total) by mouth daily with breakfast. OFFICE VISIT NEEDED FOR FURTHER REFILLS   escitalopram (LEXAPRO) 20 MG tablet TAKE 1 TABLET BY MOUTH EVERY DAY   ezetimibe (ZETIA) 10 MG tablet TAKE 1 TABLET BY MOUTH EVERY DAY   furosemide (LASIX) 40 MG tablet Take 1 tablet (40 mg total) by mouth daily. Take every  day except: Sunday. OFFICE VISIT NEEDED FOR FURTHER REFILLS   gabapentin (NEURONTIN) 300 MG capsule Take 1 capsule (300 mg total) by mouth 2 (two) times daily. OFFICE VISIT NEEDED FOR FURTHER REFILLS   lisinopril (ZESTRIL) 40 MG tablet TAKE 1/2 TABLET (20 MG TOTAL) BY MOUTH DAILY.   metoprolol succinate (TOPROL-XL) 100 MG 24 hr tablet TAKE WITH OR IMMEDIATELY FOLLOWING A MEAL.   nitroGLYCERIN (NITROSTAT) 0.4 MG SL tablet PLACE 1 TABLET UNDER THE TONGUE EVERY 5 MINUTES X 3 DOSES AS NEEDED FOR CHEST PAIN.   vitamin B-12 (CYANOCOBALAMIN) 1000 MCG tablet Take 1,000 mcg by mouth every other day.   isosorbide mononitrate (IMDUR) 30 MG 24 hr tablet Take 0.5 tablets (  15 mg total) by mouth daily.   No facility-administered encounter medications on file as of 06/08/2023.    Allergies (verified) Patient has no known allergies.   History: Past Medical History:  Diagnosis Date   Chronic renal insufficiency, stage III (moderate) (HCC)    Coronary artery disease    a. DES to LAD 04/2011 - took Effient/ASA x 1 yr, now on ASA only. b. Cath 10/2012: patent stent, otherwise normal cors. c. 05/2018 Prox LAD stent 75% restenosed, DES placed.  08/30/19 cath: patent stent, mild distal LAD dz not requiring intervention, LVEF nl, EDP normal: maximize antianginal  meds.   COVID 04/04/2020   asymptomatic   Diabetes mellitus with complication (HCC) 05/2018   A1c 6.9%   GERD (gastroesophageal reflux disease)    HAS HAD ESOPHAGUS STRETCHED SEVERAL TIMES IN THE PAST   History of bladder cancer 04/2020   TURBT 04/2020.  Surveillance cysto clear as of 07/2021   Hyperlipidemia    goal LDL < 70   Hypertension    Myocardial infarction (HCC) 04/21/2016   OSA on CPAP 10/18/2011   doesn't wear it   Poor historian    talk to dtr pam hooker cell [phone]   Right knee pain    RIGHT KNEE MEDIAL MENSICAL TEAR   Past Surgical History:  Procedure Laterality Date   CARDIAC CATHETERIZATION  06/2017   In-stent restenosis cleared out   CARDIOVASCULAR STRESS TEST     Latter part of 2019--normal per cardiologist's note from 10/2017   CERVICAL FUSION  1990 and 1993   X 2   SURGERIES    SLIGHT LIMITATION ROM   COLONOSCOPY  11/24/2004; 2018   NORMAL.  Recall 2016.  Repeat 2018->tubular adenoma x 1 (Dr. Caryl Never, Dig hea spec).  06/2022 polyp x 1   CORONARY ANGIOPLASTY WITH STENT PLACEMENT     CORONARY STENT INTERVENTION N/A 05/22/2018   Prox LAD for in stent restenosis.  DAPT x 1 yr.  Procedure: CORONARY STENT INTERVENTION;  Surgeon: Corky Crafts, MD;  Location[facility] INVASIVE CV LAB;  Service: Cardiovascular;  Laterality: N/A;   CORONARY ULTRASOUND/IVUS N/A 05/22/2018   Procedure: Intravascular Ultrasound/IVUS;  Surgeon: Corky Crafts, MD;  Location: [facility] INVASIVE CV LAB;  Service: Cardiovascular;  Laterality: N/A;   CYSTOSCOPY W/ RETROGRADES Bilateral 04/22/2020   Procedure: CYSTOSCOPY WITH RETROGRADE PYELOGRAM;  Surgeon: Belva Agee, MD;  Location: [facility];  Service: Urology;  Laterality: Bilateral;  30 MINS   ESOPHAGOGASTRODUODENOSCOPY  05/2016   mild reactive changes   KNEE ARTHROSCOPY  10/22/2011   Procedure: ARTHROSCOPY KNEE;  Surgeon: Loanne Drilling, MD;  Location: [facility];  Service: Orthopedics;  Laterality: Right;  Right knee  scope with debridement   LAPAROSCOPIC CHOLECYSTECTOMY  2018   LEFT HEART CATH AND CORONARY ANGIOGRAPHY N/A 05/22/2018   DES placed for in stent restenosis.  EF 55-60%.  Procedure: LEFT HEART CATH AND CORONARY ANGIOGRAPHY;  Surgeon: Corky Crafts, MD;  Location: [facility] INVASIVE CV LAB;  Service: Cardiovascular;  Laterality: N/A;   LEFT HEART CATH AND CORONARY ANGIOGRAPHY N/A 08/30/2019   No change from 2020 cath->patent LAD stent, mild distal LAD dz.  No intervention required. Nitrates + possible future CCB antianginal for poss microvasc dz. Procedure: LEFT HEART CATH AND CORONARY ANGIOGRAPHY;  Surgeon: Corky Crafts, MD;  Location: [facility] INVASIVE CV LAB;  Service: Cardiovascular;  Laterality: N/A;   LEFT HEART CATH AND CORONARY ANGIOGRAPHY N/A 10/01/2022   Procedure: LEFT HEART CATH  AND CORONARY ANGIOGRAPHY;  Surgeon: Lennette Bihari, MD;  Location: [facility] INVASIVE CV LAB;  Service: Cardiovascular;  Laterality: N/A;   LEFT HEART CATHETERIZATION WITH CORONARY ANGIOGRAM N/A 10/08/2012   Procedure: LEFT HEART CATHETERIZATION WITH CORONARY ANGIOGRAM;  Surgeon: Runell Gess, MD;  Location: [facility] CATH LAB;  Service: Cardiovascular;  Laterality: N/A;   NASAL SINUS SURGERY  yrs ago   ONE SINUS SURGERY THRU NOSE AND ANOTHER SINUS SURGERY THRU INCISION ABOVE RT EYE   TRANSTHORACIC ECHOCARDIOGRAM  05/21/2018   EF 60-65%, grd I DD, no valvular probs.   TRANSTHORACIC ECHOCARDIOGRAM  05/21/2018   EF 60-65%, normal wall motion, +DD, normal valves and RV fxn.   TRANSURETHRAL RESECTION OF BLADDER TUMOR N/A 04/22/2020   PATH: NONinvasive high grade uroepithelial malignancy. Procedure: TRANSURETHRAL RESECTION OF BLADDER TUMOR (TURBT); FULGERATION;  Surgeon: Belva Agee, MD;  Location: [facility];  Service: Urology;  Laterality: N/A;   Family History  Problem Relation Age of Onset   Arthritis Mother    Cancer Father    Social History   Socioeconomic History   Marital status: Married     Spouse name: Talbert Forest   Number of children: Not on file   Years of education: Not on file   Highest education level: Not on file  Occupational History   Not on file  Tobacco Use   Smoking status: Never   Smokeless tobacco: Former    Types: Engineer, drilling   Vaping status: Never Used  Substance and Sexual Activity   Alcohol use: No   Drug use: No   Sexual activity: Yes  Other Topics Concern   Not on file  Social History Narrative   Married, 2 daughters.  2 other daughters deceased.   Educ: HS   Occup: farmer, retired from [employer], Education officer, environmental at [employer].   No T/A/Ds   Social Drivers of Corporate investment banker Strain: Low Risk  (06/08/2023)   Overall Financial Resource Strain (CARDIA)    Difficulty of Paying Living Expenses: Not hard at all  Food Insecurity: No Food Insecurity (06/08/2023)   Hunger Vital Sign    Worried About Running Out of Food in the Last Year: Never true    Ran Out of Food in the Last Year: Never true  Transportation Needs: No Transportation Needs (06/08/2023)   PRAPARE - Administrator, Civil Service (Medical): No    Lack of Transportation (Non-Medical): No  Physical Activity: Inactive (06/08/2023)   Exercise Vital Sign    Days of Exercise per Week: 0 days    Minutes of Exercise per Session: 0 min  Stress: No Stress Concern Present (06/08/2023)   [employer] of Occupational Health - Occupational Stress Questionnaire    Feeling of Stress : Not at all  Social Connections: Moderately Integrated (06/08/2023)   Social Connection and Isolation Panel [NHANES]    Frequency of Communication with Friends and Family: Never    Frequency of Social Gatherings with Friends and Family: More than three times a week    Attends Religious Services: More than 4 times per year    Active Member of [employer] or Organizations: Yes    Attends Banker Meetings: More than 4 times per year    Marital Status: Widowed    Tobacco  Counseling Counseling given: Not Answered   Clinical Intake:  Pre-visit preparation completed: Yes  Pain : No/denies pain     Diabetes: Yes CBG done?: No Did pt. bring  in CBG monitor from home?: No  How often do you need to have someone help you when you read instructions, pamphlets, or other written materials from your doctor or pharmacy?: 1 - Never  Interpreter Needed?: No  Information entered by :: Remi Haggard LPN   Activities of Daily Living    06/08/2023    8:57 AM 07/07/2022    3:39 PM  In your present state of health, do you have any difficulty performing the following activities:  Hearing? 0 0  Vision? 0 0  Difficulty concentrating or making decisions? 0 0  Walking or climbing stairs? 0 0  Dressing or bathing? 0 0  Doing errands, shopping? 0 0  Preparing Food and eating ? N N  Using the Toilet? N N  In the past six months, have you accidently leaked urine? N N  Do you have problems with loss of bowel control? N N  Managing your Medications? N N  Managing your Finances? N N  Housekeeping or managing your Housekeeping?  N    Patient Care Team: Jeoffrey Massed, MD as PCP - General (Family Medicine) Corky Crafts, MD as PCP - Cardiology (Cardiology) Alba Cory as Attending Physician (Family Medicine) Elana Alm, MD as Attending Physician (Cardiology) Concepcion Elk, MD as Consulting Physician (Gastroenterology) Belva Agee, MD (Inactive) as Consulting Physician (Urology)  Indicate any recent Medical Services you may have received from other than [facility] providers in the past year (date may be approximate).     Assessment:   This is a routine wellness examination for [place].  Hearing/Vision screen Hearing Screening - Comments:: No trouble hearing Vision Screening - Comments:: Up to date [facility]   Goals Addressed             This Visit's Progress    Patient Stated       Continue current lifestyle       Depression  Screen    06/08/2023    8:56 AM 07/07/2022    3:37 PM 06/30/2022    9:23 AM 03/31/2022    9:12 AM 12/30/2021   10:10 AM 08/29/2021    9:53 AM 01/21/2021   11:00 AM  PHQ 2/9 Scores  PHQ - 2 Score 0 0 0 0 0 0 1  PHQ- 9 Score 0 0 0 3       Fall Risk    06/08/2023    8:51 AM 07/07/2022    3:39 PM 06/30/2022    9:22 AM 12/30/2021   10:10 AM 08/29/2021    9:54 AM  Fall Risk   Falls in the past year? 0 0 0 0 0  Number falls in past yr: 0 0 0 0 0  Injury with Fall? 0 0 0 0 0  Risk for fall due to :  Impaired vision No Fall Risks Impaired vision   Follow up Falls evaluation completed;Education provided;Falls prevention discussed Falls prevention discussed Falls evaluation completed Falls evaluation completed Falls evaluation completed    MEDICARE RISK AT HOME: Medicare Risk at Home Any stairs in or around the home?: Yes If so, are there any without handrails?: No Home free of loose throw rugs in walkways, pet beds, electrical cords, etc?: Yes Adequate lighting in your home to reduce risk of falls?: Yes Life alert?: No Use of a cane, walker or w/c?: No Grab bars in the bathroom?: Yes Shower chair or bench in shower?: Yes Elevated toilet seat or a handicapped toilet?: Yes  TIMED UP AND GO:  Was the test performed?  No    Cognitive Function:        06/08/2023    8:53 AM 08/29/2021    9:53 AM  6CIT Screen  What Year? 0 points 0 points  What month? 0 points 0 points  What time? 0 points 0 points  Count back from 20 0 points 0 points  Months in reverse 2 points 0 points  Repeat phrase 6 points 0 points  Total Score 8 points 0 points    Immunizations Immunization History  Administered Date(s) Administered   Pneumococcal Polysaccharide-23 11/15/2012   Zoster, Live 10/31/2012    TDAP status: Due, Education has been provided regarding the importance of this vaccine. Advised may receive this vaccine at local pharmacy or Health Dept. Aware to provide a copy of the vaccination record  if obtained from local pharmacy or Health Dept. Verbalized acceptance and understanding.  Flu Vaccine status: Up to date  Pneumococcal vaccine status: Due, Education has been provided regarding the importance of this vaccine. Advised may receive this vaccine at local pharmacy or Health Dept. Aware to provide a copy of the vaccination record if obtained from local pharmacy or Health Dept. Verbalized acceptance and understanding.  Covid-19 vaccine status: Information provided on how to obtain vaccines.   Qualifies for Shingles Vaccine? Yes   Zostavax completed No   Shingrix Completed?: No.    Education has been provided regarding the importance of this vaccine. Patient has been advised to call insurance company to determine out of pocket expense if they have not yet received this vaccine. Advised may also receive vaccine at local pharmacy or Health Dept. Verbalized acceptance and understanding.  Screening Tests Health Maintenance  Topic Date Due   Zoster Vaccines- Shingrix (1 of 2) 01/28/1997   Pneumonia Vaccine 55+ Years old (2 of 2 - PCV) 11/15/2013   Diabetic kidney evaluation - Urine ACR  10/01/2022   COVID-19 Vaccine (1 - 2024-25 season) Never done   OPHTHALMOLOGY EXAM  02/04/2023   HEMOGLOBIN A1C  04/01/2023   FOOT EXAM  06/30/2023   Diabetic kidney evaluation - eGFR measurement  09/29/2023   INFLUENZA VACCINE  10/07/2023   Medicare Annual Wellness (AWV)  06/07/2024   HPV VACCINES  Aged Out   DTaP/Tdap/Td  Discontinued   Colonoscopy  Discontinued   Hepatitis C Screening  Discontinued    Health Maintenance  Health Maintenance Due  Topic Date Due   Zoster Vaccines- Shingrix (1 of 2) 01/28/1997   Pneumonia Vaccine 72+ Years old (2 of 2 - PCV) 11/15/2013   Diabetic kidney evaluation - Urine ACR  10/01/2022   COVID-19 Vaccine (1 - 2024-25 season) Never done   OPHTHALMOLOGY EXAM  02/04/2023   HEMOGLOBIN A1C  04/01/2023    Colorectal cancer screening: No longer required.    Lung Cancer Screening: (Low Dose CT Chest recommended if Age 31-80 years, 20 pack-year currently smoking OR have quit w/in 15years.) does not qualify.   Lung Cancer Screening Referral:   Additional Screening:  Hepatitis C Screening never done  Vision Screening: Recommended annual ophthalmology exams for early detection of glaucoma and other disorders of the eye. Is the patient up to date with their annual eye exam?  Yes  Who is the provider or what is the name of the office in which the patient attends annual eye exams? [facility] If pt is not established with a provider, would they like to be referred to a provider to establish care? No .   Dental  Screening: Recommended annual dental exams for proper oral hygiene  Nutrition Risk Assessment:  Has the patient had any N/V/D within the last 2 months?  No  Does the patient have any non-healing wounds?  No  Has the patient had any unintentional weight loss or weight gain?  No   Diabetes:  Is the patient diabetic?  Yes  If diabetic, was a CBG obtained today?  No  Did the patient bring in their glucometer from home?  No  How often do you monitor your CBG's? Does not check.   Financial Strains and Diabetes Management:  Are you having any financial strains with the device, your supplies or your medication? No .  Does the patient want to be seen by Chronic Care Management for management of their diabetes?  No  Would the patient like to be referred to a Nutritionist or for Diabetic Management?  No   Diabetic Exams:  Diabetic Eye Exam: Completed .  Pt has been advised about the importance in completing this exam.  Diabetic Foot Exam: . Pt has been advised about the importance in completing this exam. .    Community Resource Referral / Chronic Care Management: CRR required this visit?  No   CCM required this visit?  No     Plan:     I have personally reviewed and noted the following in the patient's chart:   Medical and social  history Use of alcohol, tobacco or illicit drugs  Current medications and supplements including opioid prescriptions. Patient is not currently taking opioid prescriptions. Functional ability and status Nutritional status Physical activity Advanced directives List of other physicians Hospitalizations, surgeries, and ER visits in previous 12 months Vitals Screenings to include cognitive, depression, and falls Referrals and appointments  In addition, I have reviewed and discussed with patient certain preventive protocols, quality metrics, and best practice recommendations. A written personalized care plan for preventive services as well as general preventive health recommendations were provided to patient.     Remi Haggard, LPN   03/13/1094   After Visit Summary: (MyChart) Due to this being a telephonic visit, the after visit summary with patients personalized plan was offered to patient via MyChart   Nurse Notes:

## 2023-06-13 ENCOUNTER — Other Ambulatory Visit: Payer: Self-pay | Admitting: Family Medicine

## 2023-06-16 DIAGNOSIS — E782 Mixed hyperlipidemia: Secondary | ICD-10-CM | POA: Diagnosis not present

## 2023-06-16 DIAGNOSIS — E785 Hyperlipidemia, unspecified: Secondary | ICD-10-CM | POA: Diagnosis not present

## 2023-06-16 LAB — COMPREHENSIVE METABOLIC PANEL WITH GFR
ALT: 9 IU/L (ref 0–44)
AST: 16 IU/L (ref 0–40)
Albumin: 4.2 g/dL (ref 3.8–4.8)
Alkaline Phosphatase: 86 IU/L (ref 44–121)
BUN/Creatinine Ratio: 16 (ref 10–24)
BUN: 17 mg/dL (ref 8–27)
Bilirubin Total: 0.5 mg/dL (ref 0.0–1.2)
CO2: 25 mmol/L (ref 20–29)
Calcium: 9.5 mg/dL (ref 8.6–10.2)
Chloride: 103 mmol/L (ref 96–106)
Creatinine, Ser: 1.07 mg/dL (ref 0.76–1.27)
Globulin, Total: 1.9 g/dL (ref 1.5–4.5)
Glucose: 146 mg/dL — ABNORMAL HIGH (ref 70–99)
Potassium: 4.6 mmol/L (ref 3.5–5.2)
Sodium: 144 mmol/L (ref 134–144)
Total Protein: 6.1 g/dL (ref 6.0–8.5)
eGFR: 72 mL/min/{1.73_m2} (ref >59)

## 2023-06-16 LAB — LIPID PANEL
Chol/HDL Ratio: 3 ratio (ref 0.0–5.0)
Cholesterol, Total: 120 mg/dL (ref 100–199)
HDL: 40 mg/dL (ref >39)
LDL Chol Calc (NIH): 49 mg/dL (ref 0–99)
Triglycerides: 191 mg/dL — ABNORMAL HIGH (ref 0–149)
VLDL Cholesterol Cal: 31 mg/dL (ref 5–40)

## 2023-06-24 ENCOUNTER — Telehealth: Payer: Self-pay

## 2023-06-24 NOTE — Telephone Encounter (Signed)
 Left message to call back

## 2023-06-24 NOTE — Telephone Encounter (Signed)
-----   Message from Rollo FABIENE Louder sent at 06/17/2023 10:19 AM EDT ----- Please tell patient that his lab work showed normal kidney function, normal electrolytes, and normal liver function.    LDL (bad cholesterol) is excellent- well controlled at 49. Triglycerides remain a bit elevated, I would recommend he avoids fried foods, fast food, butters, heavy creams. Continue current meds   Thanks! KJ

## 2023-06-27 NOTE — Telephone Encounter (Signed)
 Called patient advised of below they verbalized understanding.

## 2023-07-01 ENCOUNTER — Other Ambulatory Visit: Payer: Self-pay | Admitting: Family Medicine

## 2023-07-10 ENCOUNTER — Other Ambulatory Visit: Payer: Self-pay | Admitting: Family Medicine

## 2023-07-21 ENCOUNTER — Encounter: Payer: Self-pay | Admitting: Family Medicine

## 2023-07-21 ENCOUNTER — Ambulatory Visit (INDEPENDENT_AMBULATORY_CARE_PROVIDER_SITE_OTHER): Admitting: Family Medicine

## 2023-07-21 VITALS — BP 135/77 | HR 61 | Temp 98.1°F | Ht 70.0 in | Wt 256.4 lb

## 2023-07-21 DIAGNOSIS — Z7984 Long term (current) use of oral hypoglycemic drugs: Secondary | ICD-10-CM | POA: Diagnosis not present

## 2023-07-21 DIAGNOSIS — E1121 Type 2 diabetes mellitus with diabetic nephropathy: Secondary | ICD-10-CM | POA: Diagnosis not present

## 2023-07-21 DIAGNOSIS — Z0000 Encounter for general adult medical examination without abnormal findings: Secondary | ICD-10-CM | POA: Diagnosis not present

## 2023-07-21 DIAGNOSIS — N182 Chronic kidney disease, stage 2 (mild): Secondary | ICD-10-CM

## 2023-07-21 DIAGNOSIS — I10 Essential (primary) hypertension: Secondary | ICD-10-CM | POA: Diagnosis not present

## 2023-07-21 DIAGNOSIS — E7800 Pure hypercholesterolemia, unspecified: Secondary | ICD-10-CM | POA: Diagnosis not present

## 2023-07-21 LAB — POCT GLYCOSYLATED HEMOGLOBIN (HGB A1C)
HbA1c POC (<> result, manual entry): 7 % (ref 4.0–5.6)
HbA1c, POC (controlled diabetic range): 7 % (ref 0.0–7.0)
HbA1c, POC (prediabetic range): 7 % — AB (ref 5.7–6.4)
Hemoglobin A1C: 7 % — AB (ref 4.0–5.6)

## 2023-07-21 MED ORDER — FUROSEMIDE 40 MG PO TABS: 40.0000 mg | ORAL_TABLET | Freq: Every day | ORAL | 1 refills | Status: DC

## 2023-07-21 MED ORDER — ESCITALOPRAM OXALATE 20 MG PO TABS: 20.0000 mg | ORAL_TABLET | Freq: Every day | ORAL | 1 refills | Status: DC

## 2023-07-21 MED ORDER — METFORMIN HCL 500 MG PO TABS: 500.0000 mg | ORAL_TABLET | Freq: Two times a day (BID) | ORAL | 1 refills | Status: DC

## 2023-07-21 MED ORDER — ATORVASTATIN CALCIUM 80 MG PO TABS: 80.0000 mg | ORAL_TABLET | Freq: Every day | ORAL | 1 refills | Status: DC

## 2023-07-21 MED ORDER — METOPROLOL SUCCINATE ER 100 MG PO TB24: ORAL_TABLET | ORAL | 1 refills | Status: DC

## 2023-07-21 MED ORDER — GABAPENTIN 300 MG PO CAPS: 300.0000 mg | ORAL_CAPSULE | Freq: Two times a day (BID) | ORAL | 0 refills | Status: DC

## 2023-07-21 MED ORDER — CLOPIDOGREL BISULFATE 75 MG PO TABS: 75.0000 mg | ORAL_TABLET | Freq: Every day | ORAL | 1 refills | Status: DC

## 2023-07-21 NOTE — Patient Instructions (Signed)
   Your A1c was 7.0

## 2023-07-21 NOTE — Progress Notes (Signed)
 Office Note 07/21/2023  CC:  Chief Complaint  Patient presents with   Medical Management of Chronic Issues    Pt is not    Patient is a 78 y.o. male who is here for annual health maintenance exam and 10 month follow-up diabetes, hypertension, hyperlipidemia. A/P as of last visit: "#1 chest pain in the setting of known coronary artery disease/history of stent. Concerning for unstable angina. EKG today with no acute ischemic changes. He has not seen his cardiologist since October 2022. Cardiology graciously got him on their schedule for this afternoon.   #2 hypertension, well-controlled on lisinopril  20 mg a day and Toprol -XL 100 mg a day. Electrolytes and creatinine today.   3.  Chronic renal insufficiency stage II.   Basic metabolic panel today. Of note, he does take Lasix  40 mg daily.   #4 hypercholesterolemia. He is on Zetia  10 mg a day and a atorvastatin  80 mg a day. Last LDL about 3 months ago was 66. Plan repeat lipids and hepatic panel in 3 months.   5.  DM, good control on metformin  500 bid. Hba1c today is 6.6%."  INTERIM HX: Feeling well. He chops wood and does yard work and has no shortness of breath, dyspnea on exertion, chest pain, palpitations, or dizziness.  No home glucose monitoring. He occasionally checks his blood pressure and it is around 130/80.  No acute concerns.  Past Medical History:  Diagnosis Date   Chronic renal insufficiency, stage III (moderate) (HCC)    Coronary artery disease    a. DES to LAD 04/2011 - took Effient/ASA x 1 yr, now on ASA only. b. Cath 10/2012: patent stent, otherwise normal cors. c. 05/2018 Prox LAD stent 75% restenosed, DES placed.  08/30/19 cath: patent stent, mild distal LAD dz not requiring intervention, LVEF nl, EDP normal: maximize antianginal meds.   COVID 04/04/2020   asymptomatic   Diabetes mellitus with complication (HCC) 05/2018   A1c 6.9%   GERD (gastroesophageal reflux disease)    HAS HAD ESOPHAGUS  STRETCHED SEVERAL TIMES IN THE PAST   History of bladder cancer 04/2020   TURBT 04/2020.  Surveillance cysto clear as of 07/2021   Hyperlipidemia    goal LDL < 70   Hypertension    Myocardial infarction (HCC) 04/21/2016   OSA on CPAP 10/18/2011   doesn't wear it   Poor historian    talk to dtr pam hooker cell [phone]   Right knee pain    RIGHT KNEE MEDIAL MENSICAL TEAR    Past Surgical History:  Procedure Laterality Date   CARDIAC CATHETERIZATION  06/2017   In-stent restenosis cleared out   CARDIOVASCULAR STRESS TEST     Latter part of 2019--normal per cardiologist's note from 10/2017   CERVICAL FUSION  1990 and 1993   X 2   SURGERIES    SLIGHT LIMITATION ROM   COLONOSCOPY  11/24/2004; 2018   NORMAL.  Recall 2016.  Repeat 2018->tubular adenoma x 1 (Dr. Stann Earnest, [facility] spec).  06/2022 polyp x 1   CORONARY ANGIOPLASTY WITH STENT PLACEMENT     CORONARY STENT INTERVENTION N/A 05/22/2018   Prox LAD for in stent restenosis.  DAPT x 1 yr.  Procedure: CORONARY STENT INTERVENTION;  Surgeon: Lucendia Rusk, MD;  Location: [facility] INVASIVE CV LAB;  Service: Cardiovascular;  Laterality: N/A;   CORONARY ULTRASOUND/IVUS N/A 05/22/2018   Procedure: Intravascular Ultrasound/IVUS;  Surgeon: Lucendia Rusk, MD;  Location: [facility] INVASIVE CV LAB;  Service: Cardiovascular;  Laterality:  N/A;   CYSTOSCOPY W/ RETROGRADES Bilateral 04/22/2020   Procedure: CYSTOSCOPY WITH RETROGRADE PYELOGRAM;  Surgeon: Sherlyn Ditto, MD;  Location: [facility];  Service: Urology;  Laterality: Bilateral;  30 MINS   ESOPHAGOGASTRODUODENOSCOPY  05/2016   mild reactive changes   KNEE ARTHROSCOPY  10/22/2011   Procedure: ARTHROSCOPY KNEE;  Surgeon: Aurther Blue, MD;  Location: [facility];  Service: Orthopedics;  Laterality: Right;  Right knee scope with debridement   LAPAROSCOPIC CHOLECYSTECTOMY  2018   LEFT HEART CATH AND CORONARY ANGIOGRAPHY N/A 05/22/2018   DES placed for in stent restenosis.  EF  55-60%.  Procedure: LEFT HEART CATH AND CORONARY ANGIOGRAPHY;  Surgeon: Lucendia Rusk, MD;  Location: [facility] INVASIVE CV LAB;  Service: Cardiovascular;  Laterality: N/A;   LEFT HEART CATH AND CORONARY ANGIOGRAPHY N/A 08/30/2019   No change from 2020 cath->patent LAD stent, mild distal LAD dz.  No intervention required. Nitrates + possible future CCB antianginal for poss microvasc dz. Procedure: LEFT HEART CATH AND CORONARY ANGIOGRAPHY;  Surgeon: Lucendia Rusk, MD;  Location: [facility] INVASIVE CV LAB;  Service: Cardiovascular;  Laterality: N/A;   LEFT HEART CATH AND CORONARY ANGIOGRAPHY N/A 10/01/2022   Procedure: LEFT HEART CATH AND CORONARY ANGIOGRAPHY;  Surgeon: Millicent Ally, MD;  Location: [facility] INVASIVE CV LAB;  Service: Cardiovascular;  Laterality: N/A;   LEFT HEART CATHETERIZATION WITH CORONARY ANGIOGRAM N/A 10/08/2012   Procedure: LEFT HEART CATHETERIZATION WITH CORONARY ANGIOGRAM;  Surgeon: Avanell Leigh, MD;  Location: [facility] CATH LAB;  Service: Cardiovascular;  Laterality: N/A;   NASAL SINUS SURGERY  yrs ago   ONE SINUS SURGERY THRU NOSE AND ANOTHER SINUS SURGERY THRU INCISION ABOVE RT EYE   TRANSTHORACIC ECHOCARDIOGRAM  05/21/2018   EF 60-65%, grd I DD, no valvular probs.   TRANSTHORACIC ECHOCARDIOGRAM  05/21/2018   EF 60-65%, normal wall motion, +DD, normal valves and RV fxn.   TRANSURETHRAL RESECTION OF BLADDER TUMOR N/A 04/22/2020   PATH: NONinvasive high grade uroepithelial malignancy. Procedure: TRANSURETHRAL RESECTION OF BLADDER TUMOR (TURBT); FULGERATION;  Surgeon: Sherlyn Ditto, MD;  Location: [facility];  Service: Urology;  Laterality: N/A;    Family History  Problem Relation Age of Onset   Arthritis Mother    Cancer Father     Social History   Socioeconomic History   Marital status: Married    Spouse name: Monroe Antigua   Number of children: Not on file   Years of education: Not on file   Highest education level: Not on file  Occupational History    Not on file  Tobacco Use   Smoking status: Never   Smokeless tobacco: Former    Types: Engineer, drilling   Vaping status: Never Used  Substance and Sexual Activity   Alcohol use: No   Drug use: No   Sexual activity: Yes  Other Topics Concern   Not on file  Social History Narrative   Married, 2 daughters.  2 other daughters deceased.   Educ: HS   Occup: farmer, retired from [employer], Education officer, environmental at [employer].   No T/A/Ds   Social Drivers of Corporate investment banker Strain: Low Risk  (06/08/2023)   Overall Financial Resource Strain (CARDIA)    Difficulty of Paying Living Expenses: Not hard at all  Food Insecurity: No Food Insecurity (06/08/2023)   Hunger Vital Sign    Worried About Running Out of Food in the Last Year: Never true    Ran Out of  Food in the Last Year: Never true  Transportation Needs: No Transportation Needs (06/08/2023)   PRAPARE - Administrator, Civil Service (Medical): No    Lack of Transportation (Non-Medical): No  Physical Activity: Inactive (06/08/2023)   Exercise Vital Sign    Days of Exercise per Week: 0 days    Minutes of Exercise per Session: 0 min  Stress: No Stress Concern Present (06/08/2023)   [employer] of Occupational Health - Occupational Stress Questionnaire    Feeling of Stress : Not at all  Social Connections: Unknown (06/08/2023)   Social Connection and Isolation Panel [NHANES]    Frequency of Communication with Friends and Family: Never    Frequency of Social Gatherings with Friends and Family: More than three times a week    Attends Religious Services: More than 4 times per year    Active Member of [employer] or Organizations: Yes    Attends Engineer, structural: More than 4 times per year    Marital Status: Not on file  Intimate Partner Violence: Not At Risk (06/08/2023)   Humiliation, Afraid, Rape, and Kick questionnaire    Fear of Current or Ex-Partner: No    Emotionally Abused: No    Physically Abused: No     Sexually Abused: No    Outpatient Medications Prior to Visit  Medication Sig Dispense Refill   acetaminophen  (TYLENOL ) 500 MG tablet Take 1,000 mg by mouth every 6 (six) hours as needed for mild pain or moderate pain (Sinus).     aspirin  EC 81 MG EC tablet Take 1 tablet (81 mg total) by mouth daily.     ezetimibe  (ZETIA ) 10 MG tablet TAKE 1 TABLET BY MOUTH EVERY DAY 90 tablet 2   isosorbide  mononitrate (IMDUR ) 30 MG 24 hr tablet Take 0.5 tablets (15 mg total) by mouth daily. 90 tablet 3   lisinopril  (ZESTRIL ) 40 MG tablet TAKE 1/2 TABLET (20 MG TOTAL) BY MOUTH DAILY. 45 tablet 3   vitamin B-12 (CYANOCOBALAMIN) 1000 MCG tablet Take 1,000 mcg by mouth every other day.     nitroGLYCERIN  (NITROSTAT ) 0.4 MG SL tablet PLACE 1 TABLET UNDER THE TONGUE EVERY 5 MINUTES X 3 DOSES AS NEEDED FOR CHEST PAIN. (Patient not taking: Reported on 07/21/2023) 25 tablet 3   atorvastatin  (LIPITOR ) 80 MG tablet TAKE 1 TABLET (80 MG TOTAL) BY MOUTH DAILY. TAKE 1 TABLET BY MOUTH DAILY AT 6 PM. 90 tablet 1   clopidogrel  (PLAVIX ) 75 MG tablet TAKE 1 TABLET (75 MG TOTAL) BY MOUTH DAILY WITH BREAKFAST. OFFICE VISIT NEEDED FOR FURTHER REFILLS 30 tablet 0   escitalopram  (LEXAPRO ) 20 MG tablet TAKE 1 TABLET BY MOUTH EVERY DAY 90 tablet 1   furosemide  (LASIX ) 40 MG tablet TAKE 1 TABLET BY MOUTH DAILY. TAKE EVERY DAY EXCEPT:SUNDAY. OFFICE VISIT NEEDED FOR FURTHER REFILLS 30 tablet 0   gabapentin  (NEURONTIN ) 300 MG capsule TAKE 1 CAPSULE (300 MG TOTAL) BY MOUTH 2 (TWO) TIMES DAILY. OFFICE VISIT NEEDED FOR FURTHER REFILLS 60 capsule 0   metoprolol  succinate (TOPROL -XL) 100 MG 24 hr tablet TAKE WITH OR IMMEDIATELY FOLLOWING A MEAL. 90 tablet 1   No facility-administered medications prior to visit.    No Known Allergies  Review of Systems  Constitutional:  Negative for appetite change, chills, fatigue and fever.  HENT:  Negative for congestion, dental problem, ear pain and sore throat.   Eyes:  Negative for discharge, redness  and visual disturbance.  Respiratory:  Negative for cough, chest tightness, shortness  of breath and wheezing.   Cardiovascular:  Negative for chest pain, palpitations and leg swelling.  Gastrointestinal:  Negative for abdominal pain, blood in stool, diarrhea, nausea and vomiting.  Genitourinary:  Negative for difficulty urinating, dysuria, flank pain, frequency, hematuria and urgency.  Musculoskeletal:  Negative for arthralgias, back pain, joint swelling, myalgias and neck stiffness.  Skin:  Negative for pallor and rash.  Neurological:  Negative for dizziness, speech difficulty, weakness and headaches.  Hematological:  Negative for adenopathy. Does not bruise/bleed easily.  Psychiatric/Behavioral:  Negative for confusion and sleep disturbance. The patient is not nervous/anxious.     PE;    07/21/2023    8:40 AM 06/07/2023    7:55 AM 12/07/2022    8:00 AM  Vitals with BMI  Height 5\' 10"  5\' 10"  5\' 8"   Weight 256 lbs 6 oz 257 lbs 252 lbs 13 oz  BMI 36.79 36.88 38.45  Systolic 135 112 161  Diastolic 77 62 70  Pulse 61 61 60    Gen: Alert, well appearing.  Patient is oriented to person, place, time, and situation. AFFECT: pleasant, lucid thought and speech. ENT: Ears: EACs clear, normal epithelium.  TMs with good light reflex and landmarks bilaterally.  Eyes: no injection, icteris, swelling, or exudate.  EOMI, PERRLA. Nose: no drainage or turbinate edema/swelling.  No injection or focal lesion.  Mouth: lips without lesion/swelling.  Oral mucosa pink and moist.  Dentition intact and without obvious caries or gingival swelling.  Oropharynx without erythema, exudate, or swelling.  Neck: supple/nontender.  No LAD, mass, or TM.  Carotid pulses 2+ bilaterally, without bruits. CV: RRR, no m/r/g.   LUNGS: CTA bilat, nonlabored resps, good aeration in all lung fields. ABD: soft, NT, ND, BS normal.  No hepatospenomegaly or mass.  No bruits. EXT: no clubbing, cyanosis, or edema.  Musculoskeletal: no  joint swelling, erythema, warmth, or tenderness.  ROM of all joints intact. Skin - no sores or suspicious lesions or rashes or color changes Foot exam -  no swelling, tenderness or skin or vascular lesions. Color and temperature is normal. Sensation is intact. Peripheral pulses are palpable. Toenails are normal.  Pertinent labs:  Lab Results  Component Value Date   TSH 1.54 08/24/2019   Lab Results  Component Value Date   WBC 7.7 09/29/2022   HGB 15.0 09/29/2022   HCT 43.9 09/29/2022   MCV 98 (H) 09/29/2022   PLT 162 09/29/2022   Lab Results  Component Value Date   CREATININE 1.07 06/16/2023   BUN 17 06/16/2023   NA 144 06/16/2023   K 4.6 06/16/2023   CL 103 06/16/2023   CO2 25 06/16/2023   Lab Results  Component Value Date   ALT 9 06/16/2023   AST 16 06/16/2023   ALKPHOS 86 06/16/2023   BILITOT 0.5 06/16/2023   Lab Results  Component Value Date   CHOL 120 06/16/2023   Lab Results  Component Value Date   HDL 40 06/16/2023   Lab Results  Component Value Date   LDLCALC 49 06/16/2023   Lab Results  Component Value Date   TRIG 191 (H) 06/16/2023   Lab Results  Component Value Date   CHOLHDL 3.0 06/16/2023   Lab Results  Component Value Date   PSA 1.00 02/20/2020   Lab Results  Component Value Date   HGBA1C 7.0 (A) 07/21/2023   HGBA1C 7.0 07/21/2023   HGBA1C 7.0 (A) 07/21/2023   HGBA1C 7.0 07/21/2023   ASSESSMENT AND PLAN:   #1  health maintenance exam: Reviewed age and gender appropriate health maintenance issues (prudent diet, regular exercise, health risks of tobacco and excessive alcohol, use of seatbelts, fire alarms in home, use of sunscreen).  Also reviewed age and gender appropriate health screening as well as vaccine recommendations. Vaccines: Declined Tdap vaccine.  Shingrix->he declines.  Prevnar 20->declines. Labs: No labs due today (CMET and lipid panel were good at cardiology about a month ago).   Prostate ca screening: Through shared  decision making process patient made decision to discontinue any prostate cancer screening. Colon ca screening: colonoscopy x 2 normal, most recent was 06/2022, 1 small polyp found, no further colonoscopies recommended by GI.  #2 diabetes with nephropathy.  Hemoglobin A1c today is 7%, up from 6.6% last check. Somewhere along the line he got off of metformin .  We will restart metformin  at 500 mg twice a day. Feet exam normal. Will do urine microalbumin/creatinine at next follow-up in 3 months.  3 hypertension, well-controlled on lisinopril  20 mg a day and Toprol -XL 100 mg a day. Electrolytes and creatinine stable at cardiology visit about 1 month ago.   3.  Chronic renal insufficiency stage II.   GFR low 60s at last check about 1 month ago. Of note, he does take Lasix  40 mg daily.   #4 hypercholesterolemia. He is on Zetia  10 mg a day and a atorvastatin  80 mg a day. Last LDL 49 about 1 month ago.  An After Visit Summary was printed and given to the patient.  FOLLOW UP:  No follow-ups on file.  Signed:  Arletha Lady, MD           07/21/2023

## 2023-09-19 ENCOUNTER — Other Ambulatory Visit: Payer: Self-pay | Admitting: Family Medicine

## 2023-10-14 ENCOUNTER — Other Ambulatory Visit: Payer: Self-pay | Admitting: Family Medicine

## 2023-10-18 DIAGNOSIS — C678 Malignant neoplasm of overlapping sites of bladder: Secondary | ICD-10-CM | POA: Diagnosis not present

## 2023-10-27 ENCOUNTER — Encounter: Payer: Self-pay | Admitting: Family Medicine

## 2023-10-27 ENCOUNTER — Ambulatory Visit (INDEPENDENT_AMBULATORY_CARE_PROVIDER_SITE_OTHER): Admitting: Family Medicine

## 2023-10-27 VITALS — BP 110/68 | HR 72 | Temp 98.2°F | Ht 70.0 in | Wt 249.0 lb

## 2023-10-27 DIAGNOSIS — I10 Essential (primary) hypertension: Secondary | ICD-10-CM

## 2023-10-27 DIAGNOSIS — E7800 Pure hypercholesterolemia, unspecified: Secondary | ICD-10-CM

## 2023-10-27 DIAGNOSIS — E119 Type 2 diabetes mellitus without complications: Secondary | ICD-10-CM

## 2023-10-27 DIAGNOSIS — J3089 Other allergic rhinitis: Secondary | ICD-10-CM

## 2023-10-27 DIAGNOSIS — Z7984 Long term (current) use of oral hypoglycemic drugs: Secondary | ICD-10-CM | POA: Diagnosis not present

## 2023-10-27 DIAGNOSIS — E1121 Type 2 diabetes mellitus with diabetic nephropathy: Secondary | ICD-10-CM

## 2023-10-27 LAB — MICROALBUMIN / CREATININE URINE RATIO
Creatinine,U: 472.7 mg/dL
Microalb Creat Ratio: 4.9 mg/g (ref 0.0–30.0)
Microalb, Ur: 2.3 mg/dL — ABNORMAL HIGH (ref 0.0–1.9)

## 2023-10-27 LAB — POCT GLYCOSYLATED HEMOGLOBIN (HGB A1C)
HbA1c POC (<> result, manual entry): 7 % (ref 4.0–5.6)
HbA1c, POC (controlled diabetic range): 7 % (ref 0.0–7.0)
HbA1c, POC (prediabetic range): 7 % — AB (ref 5.7–6.4)
Hemoglobin A1C: 7 % — AB (ref 4.0–5.6)

## 2023-10-27 LAB — BASIC METABOLIC PANEL WITH GFR
BUN: 15 mg/dL (ref 6–23)
CO2: 33 meq/L — ABNORMAL HIGH (ref 19–32)
Calcium: 8.5 mg/dL (ref 8.4–10.5)
Chloride: 100 meq/L (ref 96–112)
Creatinine, Ser: 1 mg/dL (ref 0.40–1.50)
GFR: 72.99 mL/min (ref >60.00)
Glucose, Bld: 132 mg/dL — ABNORMAL HIGH (ref 70–99)
Potassium: 3.9 meq/L (ref 3.5–5.1)
Sodium: 143 meq/L (ref 135–145)

## 2023-10-27 MED ORDER — METFORMIN HCL 1000 MG PO TABS: 1000.0000 mg | ORAL_TABLET | Freq: Two times a day (BID) | ORAL | 3 refills | Status: AC

## 2023-10-27 NOTE — Progress Notes (Signed)
 OFFICE VISIT  10/27/2023  CC: No chief complaint on file.   Patient is a 78 y.o. male who presents accompanied by his wife for 67-month follow-up diabetes, hypertension, and hyperlipidemia. A/P as of last visit: #1 diabetes with nephropathy.  Hemoglobin A1c today is 7%, up from 6.6% last check. Somewhere along the line he got off of metformin .  We will restart metformin  at 500 mg twice a day. Feet exam normal. Will do urine microalbumin/creatinine at next follow-up in 3 months.   2 hypertension, well-controlled on lisinopril  20 mg a day and Toprol -XL 100 mg a day. Electrolytes and creatinine stable at cardiology visit about 1 month ago.   3.  Chronic renal insufficiency stage II.   GFR low 60s at last check about 1 month ago. Of note, he does take Lasix  40 mg daily.   #4 hypercholesterolemia. He is on Zetia  10 mg a day and a atorvastatin  80 mg a day. Last LDL 49 about 1 month ago.  INTERIM HX: Richard [name] is feeling well. He continues to be active in his yard.  He continues to preach regularly. He is happy and has no acute concerns other than some postnasal drip in the last couple weeks. No headache, fever, face pain, purulent mucus, or sore throat. He has some coughing and sneezing in the mornings but this resolves pretty quickly.  No wheezing or shortness of breath.  Home blood pressure consistently less than 130/80. No home glucose monitoring.  ROS as above, plus--> no CP,  no dizziness,  no rashes, no melena/hematochezia.  No polyuria or polydipsia.  No myalgias or arthralgias.  No focal weakness, paresthesias, or tremors.  No acute vision or hearing abnormalities.  No dysuria or unusual/new urinary urgency or frequency.  No recent changes in lower legs. No n/v/d or abd pain.  No palpitations.     Past Medical History:  Diagnosis Date   Chronic renal insufficiency, stage III (moderate) (HCC)    Coronary artery disease    a. DES to LAD 04/2011 - took Effient/ASA x 1 yr, now on  ASA only. b. Cath 10/2012: patent stent, otherwise normal cors. c. 05/2018 Prox LAD stent 75% restenosed, DES placed.  08/30/19 cath: patent stent, mild distal LAD dz not requiring intervention, LVEF nl, EDP normal: maximize antianginal meds.   COVID 04/04/2020   asymptomatic   Diabetes mellitus with complication (HCC) 05/2018   A1c 6.9%   GERD (gastroesophageal reflux disease)    HAS HAD ESOPHAGUS STRETCHED SEVERAL TIMES IN THE PAST   History of bladder cancer 04/2020   TURBT 04/2020.  Surveillance cysto clear as of 07/2021   Hyperlipidemia    goal LDL < 70   Hypertension    Myocardial infarction (HCC) 04/21/2016   OSA on CPAP 10/18/2011   doesn't wear it   Poor historian    talk to dtr pam hooker cell [phone]   Right knee pain    RIGHT KNEE MEDIAL MENSICAL TEAR    Past Surgical History:  Procedure Laterality Date   CARDIAC CATHETERIZATION  06/2017   In-stent restenosis cleared out   CARDIOVASCULAR STRESS TEST     Latter part of 2019--normal per cardiologist's note from 10/2017   CERVICAL FUSION  1990 and 1993   X 2   SURGERIES    SLIGHT LIMITATION ROM   COLONOSCOPY  11/24/2004; 2018   NORMAL.  Recall 2016.  Repeat 2018->tubular adenoma x 1 (Dr. Jefrey, Dig hea spec).  06/2022 polyp x 1  CORONARY ANGIOPLASTY WITH STENT PLACEMENT     CORONARY STENT INTERVENTION N/A 05/22/2018   Prox LAD for in stent restenosis.  DAPT x 1 yr.  Procedure: CORONARY STENT INTERVENTION;  Surgeon: Dann Candyce RAMAN, MD;  Location: [facility] INVASIVE CV LAB;  Service: Cardiovascular;  Laterality: N/A;   CORONARY ULTRASOUND/IVUS N/A 05/22/2018   Procedure: Intravascular Ultrasound/IVUS;  Surgeon: Dann Candyce RAMAN, MD;  Location: [facility] INVASIVE CV LAB;  Service: Cardiovascular;  Laterality: N/A;   CYSTOSCOPY W/ RETROGRADES Bilateral 04/22/2020   Procedure: CYSTOSCOPY WITH RETROGRADE PYELOGRAM;  Surgeon: Rosalind Zachary NOVAK, MD;  Location: [facility];  Service: Urology;  Laterality: Bilateral;  30  MINS   ESOPHAGOGASTRODUODENOSCOPY  05/2016   mild reactive changes   KNEE ARTHROSCOPY  10/22/2011   Procedure: ARTHROSCOPY KNEE;  Surgeon: Dempsey LULLA Moan, MD;  Location: [facility];  Service: Orthopedics;  Laterality: Right;  Right knee scope with debridement   LAPAROSCOPIC CHOLECYSTECTOMY  2018   LEFT HEART CATH AND CORONARY ANGIOGRAPHY N/A 05/22/2018   DES placed for in stent restenosis.  EF 55-60%.  Procedure: LEFT HEART CATH AND CORONARY ANGIOGRAPHY;  Surgeon: Dann Candyce RAMAN, MD;  Location: [facility] INVASIVE CV LAB;  Service: Cardiovascular;  Laterality: N/A;   LEFT HEART CATH AND CORONARY ANGIOGRAPHY N/A 08/30/2019   No change from 2020 cath->patent LAD stent, mild distal LAD dz.  No intervention required. Nitrates + possible future CCB antianginal for poss microvasc dz. Procedure: LEFT HEART CATH AND CORONARY ANGIOGRAPHY;  Surgeon: Dann Candyce RAMAN, MD;  Location: [facility] INVASIVE CV LAB;  Service: Cardiovascular;  Laterality: N/A;   LEFT HEART CATH AND CORONARY ANGIOGRAPHY N/A 10/01/2022   Procedure: LEFT HEART CATH AND CORONARY ANGIOGRAPHY;  Surgeon: Burnard Debby LABOR, MD;  Location: [facility] INVASIVE CV LAB;  Service: Cardiovascular;  Laterality: N/A;   LEFT HEART CATHETERIZATION WITH CORONARY ANGIOGRAM N/A 10/08/2012   Procedure: LEFT HEART CATHETERIZATION WITH CORONARY ANGIOGRAM;  Surgeon: Dorn JINNY Lesches, MD;  Location: [facility] CATH LAB;  Service: Cardiovascular;  Laterality: N/A;   NASAL SINUS SURGERY  yrs ago   ONE SINUS SURGERY THRU NOSE AND ANOTHER SINUS SURGERY THRU INCISION ABOVE RT EYE   TRANSTHORACIC ECHOCARDIOGRAM  05/21/2018   EF 60-65%, grd I DD, no valvular probs.   TRANSTHORACIC ECHOCARDIOGRAM  05/21/2018   EF 60-65%, normal wall motion, +DD, normal valves and RV fxn.   TRANSURETHRAL RESECTION OF BLADDER TUMOR N/A 04/22/2020   PATH: NONinvasive high grade uroepithelial malignancy. Procedure: TRANSURETHRAL RESECTION OF BLADDER TUMOR (TURBT); FULGERATION;  Surgeon: Rosalind Zachary NOVAK, MD;   Location: [facility];  Service: Urology;  Laterality: N/A;    Outpatient Medications Prior to Visit  Medication Sig Dispense Refill   acetaminophen  (TYLENOL ) 500 MG tablet Take 1,000 mg by mouth every 6 (six) hours as needed for mild pain or moderate pain (Sinus).     aspirin  EC 81 MG EC tablet Take 1 tablet (81 mg total) by mouth daily.     atorvastatin  (LIPITOR ) 80 MG tablet Take 1 tablet (80 mg total) by mouth daily. TAKE 1 TABLET BY MOUTH DAILY AT 6 PM. 90 tablet 1   clopidogrel  (PLAVIX ) 75 MG tablet Take 1 tablet (75 mg total) by mouth daily with breakfast. 90 tablet 1   escitalopram  (LEXAPRO ) 20 MG tablet Take 1 tablet (20 mg total) by mouth daily. 90 tablet 1   ezetimibe  (ZETIA ) 10 MG tablet TAKE 1 TABLET BY MOUTH EVERY DAY 90 tablet 2   furosemide  (LASIX ) 40 MG tablet  Take 1 tablet (40 mg total) by mouth daily. 90 tablet 1   gabapentin  (NEURONTIN ) 300 MG capsule TAKE 1 CAPSULE BY MOUTH TWICE A DAY 60 capsule 0   isosorbide  mononitrate (IMDUR ) 30 MG 24 hr tablet Take 0.5 tablets (15 mg total) by mouth daily. 90 tablet 3   lisinopril  (ZESTRIL ) 40 MG tablet TAKE 1/2 TABLET (20 MG TOTAL) BY MOUTH DAILY. 45 tablet 3   metFORMIN  (GLUCOPHAGE ) 500 MG tablet TAKE 1 TABLET BY MOUTH 2 TIMES DAILY WITH A MEAL. 60 tablet 0   metoprolol  succinate (TOPROL -XL) 100 MG 24 hr tablet Take with or immediately following a meal. 90 tablet 1   vitamin B-12 (CYANOCOBALAMIN) 1000 MCG tablet Take 1,000 mcg by mouth every other day.     nitroGLYCERIN  (NITROSTAT ) 0.4 MG SL tablet PLACE 1 TABLET UNDER THE TONGUE EVERY 5 MINUTES X 3 DOSES AS NEEDED FOR CHEST PAIN. (Patient not taking: Reported on 10/27/2023) 25 tablet 3   No facility-administered medications prior to visit.    No Known Allergies  Review of Systems As per HPI  PE:    10/27/2023    9:20 AM 07/21/2023    8:40 AM 06/07/2023    7:55 AM  Vitals with BMI  Height 5' 10 5' 10 5' 10  Weight 249 lbs 256 lbs 6 oz 257 lbs  BMI 35.73  36.79 36.88  Systolic 110 135 887  Diastolic 68 77 62  Pulse 72 61 61     Physical Exam  Gen: Alert, well appearing.  Patient is oriented to person, place, time, and situation. AFFECT: pleasant, lucid thought and speech. CV: RRR, no m/r/g.   LUNGS: CTA bilat, nonlabored resps, good aeration in all lung fields. EXT: no edema  LABS:  Last CBC Lab Results  Component Value Date   WBC 7.7 09/29/2022   HGB 15.0 09/29/2022   HCT 43.9 09/29/2022   MCV 98 (H) 09/29/2022   MCH 33.3 (H) 09/29/2022   RDW 12.1 09/29/2022   PLT 162 09/29/2022   Last metabolic panel Lab Results  Component Value Date   GLUCOSE 146 (H) 06/16/2023   NA 144 06/16/2023   K 4.6 06/16/2023   CL 103 06/16/2023   CO2 25 06/16/2023   BUN 17 06/16/2023   CREATININE 1.07 06/16/2023   EGFR 72 06/16/2023   CALCIUM  9.5 06/16/2023   PROT 6.1 06/16/2023   ALBUMIN 4.2 06/16/2023   LABGLOB 1.9 06/16/2023   BILITOT 0.5 06/16/2023   ALKPHOS 86 06/16/2023   AST 16 06/16/2023   ALT 9 06/16/2023   ANIONGAP 8 05/22/2018   Last lipids Lab Results  Component Value Date   CHOL 120 06/16/2023   HDL 40 06/16/2023   LDLCALC 49 06/16/2023   LDLDIRECT 115.0 03/11/2021   TRIG 191 (H) 06/16/2023   CHOLHDL 3.0 06/16/2023   Last hemoglobin A1c Lab Results  Component Value Date   HGBA1C 7.0 (A) 07/21/2023   HGBA1C 7.0 07/21/2023   HGBA1C 7.0 (A) 07/21/2023   HGBA1C 7.0 07/21/2023   Last thyroid  functions Lab Results  Component Value Date   TSH 1.54 08/24/2019   IMPRESSION AND PLAN:  #1 diabetes with nephropathy. Point-of-care hemoglobin A1c today is 7.0%, unchanged compared to 3 months ago. Increase metformin  to 1000 mg twice a day. Urine microalbumin/creatinine today.  2.  Hypertension, well-controlled on Toprol -XL 100 mg a day and lisinopril  1/2 of 40mg  tab daily. Monitor electrolytes and creatinine today.  #3 hypercholesterolemia. He is on Zetia  10 mg a day  and a atorvastatin  80 mg a day. Last LDL 49  about four months ago. Plan lipid panel 3 mo.  #4 postnasal drip. Suspect allergic rhinitis.  He will add Zyrtec 10 mg q. evening to his current Flonase.  An After Visit Summary was printed and given to the patient.  FOLLOW UP: Return in about 3 months (around 01/27/2024) for routine chronic illness f/u. Next cpe 07/2024 Signed:  Gerlene Hockey, MD           10/27/2023

## 2023-10-27 NOTE — Patient Instructions (Signed)

## 2023-10-28 ENCOUNTER — Ambulatory Visit: Payer: Self-pay | Admitting: Family Medicine

## 2023-11-25 ENCOUNTER — Ambulatory Visit: Payer: Self-pay

## 2023-11-25 ENCOUNTER — Ambulatory Visit: Admitting: Family Medicine

## 2023-11-25 ENCOUNTER — Encounter: Payer: Self-pay | Admitting: Family Medicine

## 2023-11-25 VITALS — BP 117/72 | HR 71 | Temp 98.1°F | Ht 70.0 in | Wt 248.0 lb

## 2023-11-25 DIAGNOSIS — R0609 Other forms of dyspnea: Secondary | ICD-10-CM | POA: Diagnosis not present

## 2023-11-25 DIAGNOSIS — R072 Precordial pain: Secondary | ICD-10-CM | POA: Diagnosis not present

## 2023-11-25 DIAGNOSIS — R197 Diarrhea, unspecified: Secondary | ICD-10-CM | POA: Diagnosis not present

## 2023-11-25 DIAGNOSIS — R5383 Other fatigue: Secondary | ICD-10-CM

## 2023-11-25 NOTE — Telephone Encounter (Signed)
 No further action needed. Pt seen in office

## 2023-11-25 NOTE — Telephone Encounter (Signed)
 FYI Only or Action Required?: FYI only for provider.  Patient was last seen in primary care on 10/27/2023 by McGowen, Aleene DEL, MD.  Called Nurse Triage reporting Diarrhea.  Symptoms began several months ago.  Interventions attempted: Rest, hydration, or home remedies.  Symptoms are: unchanged.  Triage Disposition: See Physician Within 24 Hours  Patient/caregiver understands and will follow disposition?: Yes   Message from Rea ORN sent at 11/25/2023 12:05 PM EDT  Pt wife Elveria called to advise that he has had 4 bowel movements/ diarrhea since this morning. Pt believes it is IBS and wants to see PCP   Reason for Disposition  [1] MODERATE diarrhea (e.g., 4-6 times / day more than normal) AND [2] present > 48 hours (2 days)  Answer Assessment - Initial Assessment Questions Additional info: Requesting sdv with pcp. Scheduled  1. DIARRHEA SEVERITY: How bad is the diarrhea? How many more stools have you had in the past 24 hours than normal?       4 times today 2. ONSET: When did the diarrhea begin?      2-3 months. When he eats he needs to have bm, he goes all day long small break in the evenings 3. STOOL DESCRIPTION:  How loose or watery is the diarrhea? What is the stool color? Is there any blood or mucous in the stool?     Copious amounts loose stool  4. VOMITING: Are you also vomiting? If Yes, ask: How many times in the past 24 hours?      denies 5. ABDOMEN PAIN: Are you having any abdomen pain? If Yes, ask: What does it feel like? (e.g., crampy, dull, intermittent, constant)      Upper cramping near rib 6. ABDOMEN PAIN SEVERITY: If present, ask: How bad is the pain?  (e.g., Scale 1-10; mild, moderate, or severe)     0/10 at this time 7. ORAL INTAKE: If vomiting, Have you been able to drink liquids? How much liquids have you had in the past 24 hours?     Good intake  8. HYDRATION: Any signs of dehydration? (e.g., dry mouth [not just dry lips],  too weak to stand, dizziness, new weight loss) When did you last urinate?     hydrated 9. EXPOSURE: Have you traveled to a foreign country recently? Have you been exposed to anyone with diarrhea? Could you have eaten any food that was spoiled?     Feels he has IBS 10. ANTIBIOTIC USE: Are you taking antibiotics now or have you taken antibiotics in the past 2 months?        11. OTHER SYMPTOMS: Do you have any other symptoms? (e.g., fever, blood in stool)      Poor appetite. On Sunday developed abdominal pain and fainted x 2 while at church. He did not seek medical attention at that time.  Protocols used: [facility]

## 2023-11-25 NOTE — Progress Notes (Unsigned)
 OFFICE VISIT  11/26/2023  CC:  Chief Complaint  Patient presents with   Diarrhea    X months; 4-5 bowel movements since 12am; mid abd pain and RUQ pain    Patient is a 78 y.o. male who presents accompanied by his wife for diarrhea.  HPI: Has been having diarrhea for about 3 months now.  [place] it is occurring both postprandially and nocturnally.  Sometimes urgent. No abdominal pain, nausea, vomiting, or fever. Then, 6 days ago he felt a chills/flush going up his neck into his head and he felt dizzy.  This happened 2 times in 1 day and then has not recurred.  However, he has noted progression of his fatigue lately and for a few weeks at least he has felt some dyspnea when going up stairs.  He also has had some substernal chest pain that is lasted less than a minute or so at a time and has not been triggered by activity, but he admits on 1 occasion he almost took nitroglycerin .  No chest pressure, no jaw pain, no arm pain, no palpitations.  Appetite is great, fluid intake is good.  Review of system: No leg pain or swelling.  No focal weakness, no tremulousness, no voiding discomfort.  No melena or hematochezia.  He has not been taking any over-the-counter medications for his symptoms.   Past Medical History:  Diagnosis Date   Chronic renal insufficiency, stage III (moderate) (HCC)    Coronary artery disease    a. DES to LAD 04/2011 - took Effient/ASA x 1 yr, now on ASA only. b. Cath 10/2012: patent stent, otherwise normal cors. c. 05/2018 Prox LAD stent 75% restenosed, DES placed.  08/30/19 cath: patent stent, mild distal LAD dz not requiring intervention, LVEF nl, EDP normal: maximize antianginal meds.   COVID 04/04/2020   asymptomatic   Diabetes mellitus with complication (HCC) 05/2018   A1c 6.9%   GERD (gastroesophageal reflux disease)    HAS HAD ESOPHAGUS STRETCHED SEVERAL TIMES IN THE PAST   History of bladder cancer 04/2020   TURBT 04/2020.  Surveillance cysto clear as of 07/2021    Hyperlipidemia    goal LDL < 70   Hypertension    Myocardial infarction (HCC) 04/21/2016   OSA on CPAP 10/18/2011   doesn't wear it   Poor historian    talk to dtr pam hooker cell [phone]   Right knee pain    RIGHT KNEE MEDIAL MENSICAL TEAR    Past Surgical History:  Procedure Laterality Date   CARDIAC CATHETERIZATION  06/2017   In-stent restenosis cleared out   CARDIOVASCULAR STRESS TEST     Latter part of 2019--normal per cardiologist's note from 10/2017   CERVICAL FUSION  1990 and 1993   X 2   SURGERIES    SLIGHT LIMITATION ROM   COLONOSCOPY  11/24/2004; 2018   NORMAL.  Recall 2016.  Repeat 2018->tubular adenoma x 1 (Dr. Jefrey, Dig hea spec).  06/2022 polyp x 1   CORONARY ANGIOPLASTY WITH STENT PLACEMENT     CORONARY STENT INTERVENTION N/A 05/22/2018   Prox LAD for in stent restenosis.  DAPT x 1 yr.  Procedure: CORONARY STENT INTERVENTION;  Surgeon: Dann Candyce RAMAN, MD;  Location: [facility] INVASIVE CV LAB;  Service: Cardiovascular;  Laterality: N/A;   CORONARY ULTRASOUND/IVUS N/A 05/22/2018   Procedure: Intravascular Ultrasound/IVUS;  Surgeon: Dann Candyce RAMAN, MD;  Location: [facility] INVASIVE CV LAB;  Service: Cardiovascular;  Laterality: N/A;   CYSTOSCOPY W/ RETROGRADES Bilateral 04/22/2020  Procedure: CYSTOSCOPY WITH RETROGRADE PYELOGRAM;  Surgeon: Rosalind Zachary NOVAK, MD;  Location: [facility];  Service: Urology;  Laterality: Bilateral;  30 MINS   ESOPHAGOGASTRODUODENOSCOPY  05/2016   mild reactive changes   KNEE ARTHROSCOPY  10/22/2011   Procedure: ARTHROSCOPY KNEE;  Surgeon: Dempsey LULLA Moan, MD;  Location: [facility];  Service: Orthopedics;  Laterality: Right;  Right knee scope with debridement   LAPAROSCOPIC CHOLECYSTECTOMY  2018   LEFT HEART CATH AND CORONARY ANGIOGRAPHY N/A 05/22/2018   DES placed for in stent restenosis.  EF 55-60%.  Procedure: LEFT HEART CATH AND CORONARY ANGIOGRAPHY;  Surgeon: Dann Candyce RAMAN, MD;  Location: [facility] INVASIVE CV LAB;   Service: Cardiovascular;  Laterality: N/A;   LEFT HEART CATH AND CORONARY ANGIOGRAPHY N/A 08/30/2019   No change from 2020 cath->patent LAD stent, mild distal LAD dz.  No intervention required. Nitrates + possible future CCB antianginal for poss microvasc dz. Procedure: LEFT HEART CATH AND CORONARY ANGIOGRAPHY;  Surgeon: Dann Candyce RAMAN, MD;  Location: [facility] INVASIVE CV LAB;  Service: Cardiovascular;  Laterality: N/A;   LEFT HEART CATH AND CORONARY ANGIOGRAPHY N/A 10/01/2022   Procedure: LEFT HEART CATH AND CORONARY ANGIOGRAPHY;  Surgeon: Burnard Debby LABOR, MD;  Location: [facility] INVASIVE CV LAB;  Service: Cardiovascular;  Laterality: N/A;   LEFT HEART CATHETERIZATION WITH CORONARY ANGIOGRAM N/A 10/08/2012   Procedure: LEFT HEART CATHETERIZATION WITH CORONARY ANGIOGRAM;  Surgeon: Dorn JINNY Lesches, MD;  Location: [facility] CATH LAB;  Service: Cardiovascular;  Laterality: N/A;   NASAL SINUS SURGERY  yrs ago   ONE SINUS SURGERY THRU NOSE AND ANOTHER SINUS SURGERY THRU INCISION ABOVE RT EYE   TRANSTHORACIC ECHOCARDIOGRAM  05/21/2018   EF 60-65%, grd I DD, no valvular probs.   TRANSTHORACIC ECHOCARDIOGRAM  05/21/2018   EF 60-65%, normal wall motion, +DD, normal valves and RV fxn.   TRANSURETHRAL RESECTION OF BLADDER TUMOR N/A 04/22/2020   PATH: NONinvasive high grade uroepithelial malignancy. Procedure: TRANSURETHRAL RESECTION OF BLADDER TUMOR (TURBT); FULGERATION;  Surgeon: Rosalind Zachary NOVAK, MD;  Location: [facility];  Service: Urology;  Laterality: N/A;    Outpatient Medications Prior to Visit  Medication Sig Dispense Refill   acetaminophen  (TYLENOL ) 500 MG tablet Take 1,000 mg by mouth every 6 (six) hours as needed for mild pain or moderate pain (Sinus).     aspirin  EC 81 MG EC tablet Take 1 tablet (81 mg total) by mouth daily.     atorvastatin  (LIPITOR ) 80 MG tablet Take 1 tablet (80 mg total) by mouth daily. TAKE 1 TABLET BY MOUTH DAILY AT 6 PM. 90 tablet 1   clopidogrel  (PLAVIX ) 75 MG  tablet Take 1 tablet (75 mg total) by mouth daily with breakfast. 90 tablet 1   escitalopram  (LEXAPRO ) 20 MG tablet Take 1 tablet (20 mg total) by mouth daily. 90 tablet 1   ezetimibe  (ZETIA ) 10 MG tablet TAKE 1 TABLET BY MOUTH EVERY DAY 90 tablet 2   furosemide  (LASIX ) 40 MG tablet Take 1 tablet (40 mg total) by mouth daily. 90 tablet 1   gabapentin  (NEURONTIN ) 300 MG capsule TAKE 1 CAPSULE BY MOUTH TWICE A DAY 60 capsule 0   isosorbide  mononitrate (IMDUR ) 30 MG 24 hr tablet Take 0.5 tablets (15 mg total) by mouth daily. 90 tablet 3   lisinopril  (ZESTRIL ) 40 MG tablet TAKE 1/2 TABLET (20 MG TOTAL) BY MOUTH DAILY. 45 tablet 3   metFORMIN  (GLUCOPHAGE ) 1000 MG tablet Take 1 tablet (1,000 mg total) by mouth 2 (two)  times daily with a meal. 180 tablet 3   metoprolol  succinate (TOPROL -XL) 100 MG 24 hr tablet Take with or immediately following a meal. 90 tablet 1   vitamin B-12 (CYANOCOBALAMIN) 1000 MCG tablet Take 1,000 mcg by mouth every other day.     nitroGLYCERIN  (NITROSTAT ) 0.4 MG SL tablet PLACE 1 TABLET UNDER THE TONGUE EVERY 5 MINUTES X 3 DOSES AS NEEDED FOR CHEST PAIN. (Patient not taking: Reported on 11/25/2023) 25 tablet 3   No facility-administered medications prior to visit.    No Known Allergies  Review of Systems  As per HPI  PE:    11/25/2023    1:39 PM 10/27/2023    9:20 AM 07/21/2023    8:40 AM  Vitals with BMI  Height 5' 10 5' 10 5' 10  Weight 248 lbs 249 lbs 256 lbs 6 oz  BMI 35.58 35.73 36.79  Systolic 117 110 864  Diastolic 72 68 77  Pulse 71 72 61   02 sat 95% RA  Physical Exam  General: Alert, appears tired.  No distress. Affect is pleasant, thought and speech are lucid. ZWU:Zbzd: no injection, icteris, swelling, or exudate.  EOMI, PERRLA. Mouth: lips without lesion/swelling.  Oral mucosa pink and moist. Oropharynx without erythema, exudate, or swelling.  CV: RRR, no m/r/g.   LUNGS: CTA bilat, nonlabored resps, good aeration in all lung fields. ABD: soft,  NT, ND, BS normal.  No hepatospenomegaly or mass.  No bruits. EXT: no clubbing or cyanosis.  no edema or tenderness.  No chest wall tenderness to palpation.  LABS:  Last CBC Lab Results  Component Value Date   WBC 7.1 11/25/2023   HGB 13.3 11/25/2023   HCT 40.3 11/25/2023   MCV 98.3 11/25/2023   MCH 32.4 11/25/2023   RDW 13.4 11/25/2023   PLT 194 11/25/2023   Last metabolic panel Lab Results  Component Value Date   GLUCOSE 121 (H) 11/25/2023   NA 144 11/25/2023   K 4.6 11/25/2023   CL 107 11/25/2023   CO2 30 11/25/2023   BUN 20 11/25/2023   CREATININE 1.16 11/25/2023   GFR 72.99 10/27/2023   CALCIUM  8.6 11/25/2023   PROT 5.7 (L) 11/25/2023   ALBUMIN 4.2 06/16/2023   LABGLOB 1.9 06/16/2023   BILITOT 0.3 11/25/2023   ALKPHOS 86 06/16/2023   AST 16 11/25/2023   ALT 8 (L) 11/25/2023   ANIONGAP 8 05/22/2018   Lab Results  Component Value Date   HGBA1C 7.0 (A) 10/27/2023   HGBA1C 7.0 10/27/2023   HGBA1C 7.0 (A) 10/27/2023   HGBA1C 7.0 10/27/2023   12 lead EKG today: Sinus rhythm, rate 70, slightly delayed R wave progression, left axis-anterior fascicular block.  No ST segment or T wave abnormalities. Compared to EKG June 07, 2023 there is no change.  IMPRESSION AND PLAN:  #1 diarrhea, unknown etiology. Obtain stool for culture, Giardia and crypto, and C. difficile PCR. Check CBC, c-Met.  2.  Fatigue, dyspnea on exertion, history of coronary artery disease.  Some concern for unstable angina. EKG today without signs of ischemia, unchanged compared to April 2025. Check D-dimer. He has follow-up with his cardiologist 11/29/2023.  Continue aspirin , Plavix , statin, and beta-blocker. Signs/symptoms to call or return for were reviewed and pt expressed understanding.  I personally spent a total of 44 minutes in the care of the patient today including preparing to see the patient, performing a medically appropriate exam/evaluation, counseling and educating, placing orders,  documenting clinical information in the EHR, and  independently interpreting results.  An After Visit Summary was printed and given to the patient.  FOLLOW UP: TBD  Signed:  Gerlene Hockey, MD           11/26/2023

## 2023-11-26 ENCOUNTER — Ambulatory Visit: Payer: Self-pay | Admitting: Family Medicine

## 2023-11-26 LAB — CBC WITH DIFFERENTIAL/PLATELET
Absolute Lymphocytes: 2350 {cells}/uL (ref 850–3900)
Absolute Monocytes: 611 {cells}/uL (ref 200–950)
Basophils Absolute: 28 {cells}/uL (ref 0–200)
Basophils Relative: 0.4 %
Eosinophils Absolute: 263 {cells}/uL (ref 15–500)
Eosinophils Relative: 3.7 %
HCT: 40.3 % (ref 38.5–50.0)
Hemoglobin: 13.3 g/dL (ref 13.2–17.1)
MCH: 32.4 pg (ref 27.0–33.0)
MCHC: 33 g/dL (ref 32.0–36.0)
MCV: 98.3 fL (ref 80.0–100.0)
MPV: 11.1 fL (ref 7.5–12.5)
Monocytes Relative: 8.6 %
Neutro Abs: 3848 {cells}/uL (ref 1500–7800)
Neutrophils Relative %: 54.2 %
Platelets: 194 Thousand/uL (ref 140–400)
RBC: 4.1 Million/uL — ABNORMAL LOW (ref 4.20–5.80)
RDW: 13.4 % (ref 11.0–15.0)
Total Lymphocyte: 33.1 %
WBC: 7.1 Thousand/uL (ref 3.8–10.8)

## 2023-11-26 LAB — COMPREHENSIVE METABOLIC PANEL WITH GFR
AG Ratio: 2 (calc) (ref 1.0–2.5)
ALT: 8 U/L — ABNORMAL LOW (ref 9–46)
AST: 16 U/L (ref 10–35)
Albumin: 3.8 g/dL (ref 3.6–5.1)
Alkaline phosphatase (APISO): 62 U/L (ref 35–144)
BUN: 20 mg/dL (ref 7–25)
CO2: 30 mmol/L (ref 20–32)
Calcium: 8.6 mg/dL (ref 8.6–10.3)
Chloride: 107 mmol/L (ref 98–110)
Creat: 1.16 mg/dL (ref 0.70–1.28)
Globulin: 1.9 g/dL (ref 1.9–3.7)
Glucose, Bld: 121 mg/dL — ABNORMAL HIGH (ref 65–99)
Potassium: 4.6 mmol/L (ref 3.5–5.3)
Sodium: 144 mmol/L (ref 135–146)
Total Bilirubin: 0.3 mg/dL (ref 0.2–1.2)
Total Protein: 5.7 g/dL — ABNORMAL LOW (ref 6.1–8.1)
eGFR: 65 mL/min/1.73m2 (ref >=60)

## 2023-11-26 LAB — D-DIMER, QUANTITATIVE: D-Dimer, Quant: 0.59 ug{FEU}/mL — ABNORMAL HIGH (ref <0.50)

## 2023-11-28 LAB — POCT INFLUENZA A/B
Influenza A, POC: NEGATIVE
Influenza B, POC: NEGATIVE

## 2023-11-28 LAB — POC COVID19 BINAXNOW: SARS Coronavirus 2 Ag: NEGATIVE

## 2023-11-28 NOTE — Progress Notes (Signed)
 yes

## 2023-11-29 ENCOUNTER — Ambulatory Visit: Attending: Cardiology | Admitting: Cardiology

## 2023-11-29 ENCOUNTER — Telehealth (HOSPITAL_COMMUNITY): Payer: Self-pay | Admitting: *Deleted

## 2023-11-29 ENCOUNTER — Encounter: Payer: Self-pay | Admitting: Cardiology

## 2023-11-29 VITALS — BP 122/64 | HR 70 | Ht 70.0 in | Wt 248.0 lb

## 2023-11-29 DIAGNOSIS — I25119 Atherosclerotic heart disease of native coronary artery with unspecified angina pectoris: Secondary | ICD-10-CM

## 2023-11-29 DIAGNOSIS — Z955 Presence of coronary angioplasty implant and graft: Secondary | ICD-10-CM | POA: Diagnosis not present

## 2023-11-29 DIAGNOSIS — I10 Essential (primary) hypertension: Secondary | ICD-10-CM | POA: Diagnosis not present

## 2023-11-29 DIAGNOSIS — I5032 Chronic diastolic (congestive) heart failure: Secondary | ICD-10-CM

## 2023-11-29 DIAGNOSIS — E781 Pure hyperglyceridemia: Secondary | ICD-10-CM | POA: Diagnosis not present

## 2023-11-29 DIAGNOSIS — I2510 Atherosclerotic heart disease of native coronary artery without angina pectoris: Secondary | ICD-10-CM | POA: Diagnosis not present

## 2023-11-29 DIAGNOSIS — R0789 Other chest pain: Secondary | ICD-10-CM | POA: Diagnosis not present

## 2023-11-29 DIAGNOSIS — R5383 Other fatigue: Secondary | ICD-10-CM

## 2023-11-29 DIAGNOSIS — R55 Syncope and collapse: Secondary | ICD-10-CM

## 2023-11-29 DIAGNOSIS — G4733 Obstructive sleep apnea (adult) (pediatric): Secondary | ICD-10-CM

## 2023-11-29 DIAGNOSIS — E782 Mixed hyperlipidemia: Secondary | ICD-10-CM

## 2023-11-29 MED ORDER — ISOSORBIDE MONONITRATE ER 30 MG PO TB24: 30.0000 mg | ORAL_TABLET | Freq: Every day | ORAL | 3 refills | Status: AC

## 2023-11-29 NOTE — Progress Notes (Signed)
 Cardiology Office Note:  .   Date:  12/01/2023  ID:  LARY ECKARDT, DOB Jul 20, 1945, MRN [id number] PCP: Candise Aleene DEL, MD  [facility] Providers Cardiologist:  Alm Clay, MD     Chief Complaint  Patient presents with   Follow-up    Establish new cardiologist.  Complaining of multiple symptoms.   Coronary Artery Disease    Complaining of more frequent and more worrisome chest discomfort with less activity.  Also worsening dyspnea.    Patient Profile: .     GURJOT BRISCO is a moderately obese 78 y.o. male with a PMH reviewed below who presents here for ~31-month follow-up and to establish new cardiologist.  At the request of McGowen, Aleene DEL, MD.  Octaviano was previously followed by Dr. Gordy Reek although he does not recall the last time he saw him.  He is ultimately followed up with Dr. Wilhemenia seen April 2025  PMH: CAD-LAD PCI February 2013-LAD PCI => August 2014 patent LAD stent with 30% ISR March 2020: 70% ISR of proximal LAD-PTCA and overlapping proximal LAD stent. => June 2021: Patent LAD stent segment with 20% distal left main 25% mid LAD July 2024 (unstable angina): Mild nonobstructive residual CAD.  Smooth 30% ostial transient distal left main.  Patent proximal LAD stents with 20% residual narrowing) mid and mid LAD stenosis. HTN HLD OSA H/o bladder cancer Heavy snoring, with likely diagnosis of OSA =>  refused CPAP machine    NATASHA PAULSON was seen by cardiology APP in October 2024.  Doing well.  No chest pain pressure or tightness.  Some exertional dyspnea with heavy exertion.  Remains on ASA/Plavix , Lipitor /Zetia  and metoprolol /Imdur . => He was most recently seen by Dr. Burnard in April 2025 noting that he overall was doing well and denied any specific concerns or complaints.  Noted a little bit of dyspnea especially when climbing stairs.  But no chest pain or shortness breath otherwise.  Able to mow the lawn and do yard work occasionally have to take  breaks to catch his breath..  Reported rare chest pain but nothing similar to his MI.  Chest pain was random/infrequent  once in a blue moon and did not appear with physical activities.  Noted that he was slowing down.  Dealing with a lot of allergies.  Medications for treating allergies made him feel tired. Continued long-term DAPT (ASA 81/Plavix  75 mg. Continued Lipitor  80 mg/Zetia  10 mg along with metoprolol  succinate (Toprol  XL) 100 mg daily and Imdur  50 mg daily along with lisinopril  20-minute daily and Lasix  40 mg daily. Lipid panel ordered  Subjective  Discussed the use of AI scribe software for clinical note transcription with the patient, who gave verbal consent to proceed.  History of Present Illness Cody Albus Christophere Hillhouse is a 78 year old male with coronary artery disease and hypertension who presents with atypical chest pain and exertional dyspnea.  He experiences persistent chest pressure since yesterday, described as pressure rather than pain, which does not worsen with exertion or resolve with rest. He also notes exertional dyspnea, particularly when climbing stairs or walking from the parking garage to the clinic, requiring him to stop and rest multiple times. No acute chest pain similar to previous episodes before stent placement is reported.  He has a history of coronary artery disease with a stent placed in the LAD in 2020 and a follow-up in 2024. He recalls having a heart attack prior to the first stent placement, characterized by  chest discomfort and slow movements, but denies similar symptoms currently. He experiences infrequent palpitations but does not feel his heart racing or skipping.  He reports loose stools, requiring him to use the bathroom multiple times a day and night, which disrupts his sleep. He takes herbs to manage this, which have provided some relief. He also experiences swelling in his legs, managed with Lasix  40 mg daily, though he sometimes skips doses to avoid  frequent urination during pastoral visits.  He has obstructive sleep apnea and notes difficulty tolerating CPAP. He denies waking up short of breath or experiencing orthopnea, using one pillow at night. He reports fatigue, which he attributes to disrupted sleep and early waking due to his wife's work schedule.  He mentions a recent episode of feeling extremely hot with a sensation of burning from his chest to his head, occurring twice within fifteen minutes, accompanied by nausea but no syncope or stroke-like symptoms. No recent epistaxis, blood in stools, or urine.   ROS:  Review of Systems - Negative except symptoms noted above    Objective   Social History: Married.  [place].  Current Meds  Medication Sig   acetaminophen  (TYLENOL ) 500 MG tablet Take 1,000 mg by mouth every 6 (six) hours as needed for mild pain or moderate pain (Sinus).   aspirin  EC 81 MG EC tablet Take 1 tablet (81 mg total) by mouth daily.   atorvastatin  (LIPITOR ) 80 MG tablet Take 1 tablet (80 mg total) by mouth daily. TAKE 1 TABLET BY MOUTH DAILY AT 6 PM.   clopidogrel  (PLAVIX ) 75 MG tablet Take 1 tablet (75 mg total) by mouth daily with breakfast.   escitalopram  (LEXAPRO ) 20 MG tablet Take 1 tablet (20 mg total) by mouth daily.   ezetimibe  (ZETIA ) 10 MG tablet TAKE 1 TABLET BY MOUTH EVERY DAY   furosemide  (LASIX ) 40 MG tablet Take 1 tablet (40 mg total) by mouth daily.   gabapentin  (NEURONTIN ) 300 MG capsule TAKE 1 CAPSULE BY MOUTH TWICE A DAY   isosorbide  mononitrate (IMDUR ) 30 MG 24 hr tablet Take 0.5 tablets (15 mg total) by mouth daily.   lisinopril  (ZESTRIL ) 40 MG tablet TAKE 1/2 TABLET (20 MG TOTAL) BY MOUTH DAILY.   metFORMIN  (GLUCOPHAGE ) 1000 MG tablet Take 1 tablet (1,000 mg total) by mouth 2 (two) times daily with a meal.   metoprolol  succinate (TOPROL -XL) 100 MG 24 hr tablet Take with or immediately following a meal.   nitroGLYCERIN  (NITROSTAT ) 0.4 MG SL tablet PLACE 1 TABLET UNDER THE TONGUE EVERY 5  MINUTES X 3 DOSES AS NEEDED FOR CHEST PAIN.   vitamin B-12 (CYANOCOBALAMIN) 1000 MCG tablet Take 1,000 mcg by mouth every other day.    Studies Reviewed: SABRA   EKG Interpretation Date/Time:  Tuesday November 29 2023 08:58:33 EDT Ventricular Rate:  70 PR Interval:  178 QRS Duration:  110 QT Interval:  396 QTC Calculation: 427 R Axis:   -63  Text Interpretation: Normal sinus rhythm Left anterior fascicular block Minimal voltage criteria for LVH, may be normal variant ( Cornell product ) When compared with ECG of 07-Jun-2023 08:01, No significant change was found Confirmed by Anner Lenis ([id number]) on 11/29/2023 9:26:33 AM   Lab Results  Component Value Date   WBC 7.1 11/25/2023   HGB 13.3 11/25/2023   HCT 40.3 11/25/2023   MCV 98.3 11/25/2023   PLT 194 11/25/2023   Lab Results  Component Value Date   CHOL 120 06/16/2023   HDL 40 06/16/2023   LDLCALC  49 06/16/2023   LDLDIRECT 115.0 03/11/2021   TRIG 191 (H) 06/16/2023   CHOLHDL 3.0 06/16/2023   Lab Results  Component Value Date   NA 144 11/25/2023   K 4.6 11/25/2023   CREATININE 1.16 11/25/2023   EGFR 65 11/25/2023   GLUCOSE 121 (H) 11/25/2023   Lab Results  Component Value Date   HGBA1C 7.0 (A) 10/27/2023   PRIOR STUDIES CATH PCI (05/2018): Prox LAD 75% ISR in prox portion of prior Stent-> Synergy DES 4 x 38 mm (post-dilated to 4.5) overlapping prior stent CATH (09/2022): EF 50-55%.  Mild nonobstructive residual CAD with smooth 30% ostial and 20% distal left main stenoses; patent proximal LAD stent with mild 20% residual narrowing, and 20% smooth mid LAD STENT ISR  Echo (05/2018): EF 60-65%. No RWMA.  Normal LV size and function.  Normal RVP.  No significant valve disease.  Risk Assessment/Calculations:             Physical Exam:   VS:  BP 122/64   Pulse 70   Ht 5' 10 (1.778 m)   Wt 248 lb (112.5 kg)   SpO2 95%   BMI 35.58 kg/m    Wt Readings from Last 3 Encounters:  11/29/23 248 lb (112.5 kg)  11/25/23 248  lb (112.5 kg)  10/27/23 249 lb (112.9 kg)     GEN: Well nourished, well groomed in no acute distress; moderate her moderately obese NECK: No JVD; No carotid bruits CARDIAC: Normal S1, S2; RRR, no murmurs, rubs, gallops RESPIRATORY:  Clear to auscultation without rales, wheezing or rhonchi ; nonlabored, good air movement. ABDOMEN: Soft, non-tender, non-distended EXTREMITIES: Bilateral lower extremity edema roughly 2-3+, difficult to assess to the stockings.; No deformity      ASSESSMENT AND PLAN: .    Problem List Items Addressed This Visit       Cardiology Problems   CAD S/P percutaneous coronary angioplasty - Primary (Chronic)   Extensive stented section in the proximal to mid LAD with trivial ISR by last cath.   He has had PTCA and overlapping stent placed in the past.   Unfortunate, he is now having somewhat concerning symptoms of exertional dyspnea with atypical chest discomfort. - Despite being several years out from PCI, he is maintained on DAPT (ASA 81/Plavix  75 mg daily.)  Symptoms because we are looking for new ischemia on Myoview, would continue current medication regimen including DAPT.       Relevant Medications   isosorbide  mononitrate (IMDUR ) 30 MG 24 hr tablet   Chronic diastolic heart failure (HCC) (Chronic)   Given his age and existence of CAD, quite likely he does have diastolic dysfunction.  He has edema as well as some exertional dyspnea and orthopnea symptoms. BP seems to be pretty well-controlled with 20 mg lisinopril  +100 mg Toprol -goals keep afterload down. Continue furosemide  40 mg daily-low threshold to take additional doses for worsening dyspnea.   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  Patent LAD stent  from July 2024 cardiac catheterization.  Differential diagnosis includes microvascular disease, musculoskeletal pain, or gastrointestinal causes. No significant heart failure symptoms. Previous stents in LAD with no significant new narrowing. - Order stress PET to evaluate for ischemic macrovascular or possibly microvascular disease. - Continue Toprol -XL 100 mg daily along with lisinopril  20 mg (1/2 of 40 mg tab) - Increase Isosorbide  mononitrate to 30 mg daily => if no evidence of macrovascular disease, would likely convert to Ranexa and potentially reduce lisinopril  dose to add amlodipine . - Continue combination of rosuvastatin 20 mg and Zetia  10 mg with excellent lipid control. - Follow up in 2-3 months, earlier if stress PET results indicate need for urgent intervention.      Relevant Medications   isosorbide  mononitrate (IMDUR ) 30 MG 24 hr tablet   Other Relevant Orders   Cardiac Stress Test: Informed Consent Details: Physician/Practitioner Attestation; Transcribe to consent form and obtain patient signature   HTN (hypertension)   Blood pressure finally seems well-controlled today. - Continue on lisinopril  20 mg and metoprolol  100 mg XL - If there is concern for microvascular disease, would reduce lisinopril  dose in order to use amlodipine .      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  With an LDL of 49.  Triglycerides are slightly elevated at 191-goes along with elevated A1c.   Unfortunately, he is somewhat sedentary due to knee pain, chronic pain and obesity.      Relevant Medications   isosorbide  mononitrate (IMDUR ) 30 MG 24 hr tablet   Near syncope (Chronic)   Unclear etiology.  Will try to avoid excess BP control and rate control.  Two episodes of feeling hot and lightheaded, with no clear cardiac symptoms during episodes. Bladder cancer History of bladder cancer.  Can be assessed with stress PET.      Relevant Medications   isosorbide  mononitrate (IMDUR ) 30 MG 24 hr tablet     Other   Activity intolerance related to fatigue   Given his history of CAD, having worsening exertional dyspnea and intolerance warrants ischemic evaluation until we know for sure what the year recently showed. Concerned that the symptoms could be akin to his angina.  Will evaluate with NM PET Cardiac Perfusion evaluation to evaluate for macro versus microvascular disease.      OSA (obstructive sleep apnea) (Chronic)   Unable to tolerate CPAP.      S/P coronary artery stent placement (Chronic)   He has been maintained on ASA/Plavix  DAPT.  Depending on what his stress test looks like we could potentially consider stopping the aspirin  and continue Plavix  by itself.  Reassess in outpatient.      Other Visit Diagnoses       Atypical chest pain       Relevant Orders   NM PET CT CARDIAC PERFUSION MULTI W/ABSOLUTE BLOODFLOW (Completed)       Assessment and Plan Evaluate atypical symptoms with a stress PET.  He has high risk of finding something.  Not very mobile and hassles with which activity at this point.  Will try to schedule echo and seen close follow-up.      Informed Consent   Shared Decision Making/Informed Consent The risks [chest pain,  shortness of breath, cardiac arrhythmias, dizziness, blood pressure fluctuations,  myocardial infarction, stroke/transient ischemic attack, nausea, vomiting, allergic reaction, radiation exposure, metallic taste sensation and life-threatening complications (estimated to be 1 in 10,000)], benefits (risk stratification, diagnosing coronary artery disease, treatment guidance) and alternatives of a cardiac PET stress test were discussed in detail with Mr. Lukas and he agrees to proceed.      Follow-Up: Return in about 3 months (around 02/28/2024) for 2-3 mos with Dr Anner or Rollo Louder, PA.  I spent 56 minutes in the care of TASHI BAND today including reviewing labs (3 minutes comparing previous labs to recent labs both local and outside), reviewing outside labs from including on laptop (n/a-included previous minutes), reviewing studies ( ), reviewing outside studies (none), face to face time discussing treatment options (27), reviewing records from last clinic visit, last hospitalization and cath as well as follow-up procedures.  (9 minutes), 11, and documenting in the encounter.      Signed, Alm MICAEL Anner, MD, MS Alm Anner, M.D., M.S. Interventional Cardiologist  [facility] Pager # [phone]

## 2023-11-29 NOTE — Telephone Encounter (Signed)
Reaching out to patient to offer assistance regarding upcoming cardiac imaging study; pt verbalizes understanding of appt date/time, parking situation and where to check in, pre-test NPO status and verified current allergies; name and call back number provided for further questions should they arise  Richard Brick RN Navigator Cardiac Imaging [facility] [phone] office [phone] cell  Patient aware to avoid caffeine 12 hours prior to her cardiac PET study.

## 2023-11-29 NOTE — Patient Instructions (Addendum)
 Medication Instructions:  Increase Imdur  to 30 mg *If you need a refill on your cardiac medications before your next appointment, please call your pharmacy*  Lab Work: None ordered If you have labs (blood work) drawn today and your tests are completely normal, you will receive your results only by: MyChart Message (if you have MyChart) OR A paper copy in the mail If you have any lab test that is abnormal or we need to change your treatment, we will call you to review the results.  Testing/Procedures:    Please report to Radiology at the [facility] Main Entrance 30 minutes early for your test.  [address] [place], [place] [address]   How to Prepare for Your Cardiac PET/CT Stress Test:  Nothing to eat or drink, except water , 3 hours prior to arrival time.  NO caffeine/decaffeinated products, or chocolate 12 hours prior to arrival. (Please note decaffeinated beverages (teas/coffees) still contain caffeine).  If you have caffeine within 12 hours prior, the test will need to be rescheduled.  Medication instructions: Do not take erectile dysfunction medications for 72 hours prior to test (sildenafil, tadalafil) Do not take nitrates (isosorbide  mononitrate, Ranexa) the day before or day of test Do not take tamsulosin the day before or morning of test Hold theophylline containing medications for 12 hours. Hold Dipyridamole 48 hours prior to the test.  Diabetic Preparation: If able to eat breakfast prior to 3 hour fasting, you may take all medications, including your insulin . Do not worry if you miss your breakfast dose of insulin  - start at your next meal. If you do not eat prior to 3 hour fast-Hold all diabetes (oral and insulin ) medications. Patients who wear a continuous glucose monitor MUST remove the device prior to scanning.  You may take your remaining medications with water .  NO perfume, cologne or lotion on chest or abdomen area. FEMALES - Please avoid wearing  dresses to this appointment.  Total time is 1 to 2 hours; you may want to bring reading material for the waiting time.  IF YOU THINK YOU MAY BE PREGNANT, OR ARE NURSING PLEASE INFORM THE TECHNOLOGIST.  In preparation for your appointment, medication and supplies will be purchased.  Appointment availability is limited, so if you need to cancel or reschedule, please call the Radiology Department Scheduler at [phone] 24 hours in advance to avoid a cancellation fee of $100.00  What to Expect When you Arrive:  Once you arrive and check in for your appointment, you will be taken to a preparation room within the Radiology Department.  A technologist or Nurse will obtain your medical history, verify that you are correctly prepped for the exam, and explain the procedure.  Afterwards, an IV will be started in your arm and electrodes will be placed on your skin for EKG monitoring during the stress portion of the exam. Then you will be escorted to the PET/CT scanner.  There, staff will get you positioned on the scanner and obtain a blood pressure and EKG.  During the exam, you will continue to be connected to the EKG and blood pressure machines.  A small, safe amount of a radioactive tracer will be injected in your IV to obtain a series of pictures of your heart along with an injection of a stress agent.    After your Exam:  It is recommended that you eat a meal and drink a caffeinated beverage to counter act any effects of the stress agent.  Drink plenty of  fluids for the remainder of the day and urinate frequently for the first couple of hours after the exam.  Your doctor will inform you of your test results within 7-10 business days.  For more information and frequently asked questions, please visit our website: [email or web address]  For questions about your test or how to prepare for your test, please call: Cardiac Imaging Nurse Navigators Office: [phone]   Follow-Up: At [facility], you and your health needs are our priority.  As part of our continuing mission to provide you with exceptional heart care, our providers are all part of one team.  This team includes your primary Cardiologist (physician) and Advanced Practice Providers or APPs (Physician Assistants and Nurse Practitioners) who all work together to provide you with the care you need, when you need it.  Your next appointment:   2-3 mos with Dr Anner or Rollo Louder, PA  We recommend signing up for the patient portal called MyChart.  Sign up information is provided on this After Visit Summary.  MyChart is used to connect with patients for Virtual Visits (Telemedicine).  Patients are able to view lab/test results, encounter notes, upcoming appointments, etc.  Non-urgent messages can be sent to your provider as well.   To learn more about what you can do with MyChart, go to [email or web address].

## 2023-11-30 ENCOUNTER — Encounter: Payer: Self-pay | Admitting: Cardiology

## 2023-11-30 ENCOUNTER — Ambulatory Visit (HOSPITAL_COMMUNITY)
Admission: RE | Admit: 2023-11-30 | Discharge: 2023-11-30 | Disposition: A | Discharge status: Home / Self Care | Source: Ambulatory Visit | Attending: Cardiology | Admitting: Cardiology

## 2023-11-30 DIAGNOSIS — I2510 Atherosclerotic heart disease of native coronary artery without angina pectoris: Secondary | ICD-10-CM | POA: Insufficient documentation

## 2023-11-30 DIAGNOSIS — R0789 Other chest pain: Secondary | ICD-10-CM | POA: Diagnosis not present

## 2023-11-30 LAB — NM PET CT CARDIAC PERFUSION MULTI W/ABSOLUTE BLOODFLOW
MBFR: 2.25
Nuc Rest EF: 57 %
Nuc Stress EF: 69 %
Rest MBF: 0.96 ml/g/min
Rest Nuclear Isotope Dose: 29.1 mCi
ST Depression (mm): 0 mm
Stress MBF: 2.16 ml/g/min
Stress Nuclear Isotope Dose: 29.1 mCi

## 2023-11-30 MED ORDER — REGADENOSON 0.4 MG/5ML IV SOLN
0.4000 mg | Freq: Once | INTRAVENOUS | Status: AC
Start: 2023-11-30 — End: 2023-11-30
  Administered 2023-11-30: 0.4 mg via INTRAVENOUS

## 2023-11-30 MED ORDER — RUBIDIUM RB82 GENERATOR (RUBYFILL)
29.2000 | PACK | Freq: Once | INTRAVENOUS | Status: AC
  Administered 2023-11-30: 29.2 via INTRAVENOUS

## 2023-11-30 MED ORDER — RUBIDIUM RB82 GENERATOR (RUBYFILL)
29.2000 | PACK | Freq: Once | INTRAVENOUS | Status: AC
  Administered 2023-11-30: 29.14 via INTRAVENOUS

## 2023-11-30 MED ORDER — REGADENOSON 0.4 MG/5ML IV SOLN
INTRAVENOUS | Status: AC
  Filled 2023-11-30: qty 5

## 2023-11-30 NOTE — Progress Notes (Incomplete)
 Cardiology Office Note:  .   Date:  11/29/2023  ID:  MASAYUKI SAKAI, DOB Jun 19, 1945, MRN [id number] PCP: Candise Aleene DEL, MD  [facility] Providers Cardiologist:  Alm Clay, MD { Click to update primary MD,subspecialty MD or APP then REFRESH:1}    No chief complaint on file.   Patient Profile: .     KEYSHAUN EXLEY is a moderately obese 78 y.o. male with a PMH reviewed below who presents here for ~63-month follow-up and to establish new cardiologist.  At the request of McGowen, Aleene DEL, MD.  Octaviano was previously followed by Dr. Gordy Reek although he does not recall the last time he saw him.  He is ultimately followed up with Dr. Wilhemenia seen April 2025  PMH: CAD-LAD PCI February 2013-LAD PCI => August 2014 patent LAD stent with 30% ISR March 2020: 70% ISR of proximal LAD-PTCA and overlapping proximal LAD stent. => June 2021: Patent LAD stent segment with 20% distal left main 25% mid LAD July 2024 (unstable angina): Mild nonobstructive residual CAD.  Smooth 30% ostial transient distal left main.  Patent proximal LAD stents with 20% residual narrowing) mid and mid LAD stenosis. HTN HLD OSA H/o bladder cancer Heavy snoring, with likely diagnosis of OSA =>  refused CPAP machine    CHE RACHAL was seen by cardiology APP in October 2024.  Doing well.  No chest pain pressure or tightness.  Some exertional dyspnea with heavy exertion.  Remains on ASA/Plavix , Lipitor /Zetia  and metoprolol /Imdur . => He was most recently seen by Dr. Burnard in April 2025 noting that he overall was doing well and denied any specific concerns or complaints.  Noted a little bit of dyspnea especially when climbing stairs.  But no chest pain or shortness breath otherwise.  Able to mow the lawn and do yard work occasionally have to take breaks to catch his breath..  Reported rare chest pain but nothing similar to his MI.  Chest pain was random/infrequent  once in a blue moon and did not appear with  physical activities.  Noted that he was slowing down.  Dealing with a lot of allergies.  Medications for treating allergies made him feel tired. Continued long-term DAPT (ASA 81/Plavix  75 mg. Continued Lipitor  80 mg/Zetia  10 mg along with metoprolol  succinate (Toprol  XL) 100 mg daily and Imdur  50 mg daily along with lisinopril  20-minute daily and Lasix  40 mg daily. Lipid panel ordered  Subjective  Discussed the use of AI scribe software for clinical note transcription with the patient, who gave verbal consent to proceed.  History of Present Illness Dwaine Pringle Ashanti Littles is a 78 year old male with coronary artery disease and hypertension who presents with atypical chest pain and exertional dyspnea.  He experiences persistent chest pressure since yesterday, described as pressure rather than pain, which does not worsen with exertion or resolve with rest. He also notes exertional dyspnea, particularly when climbing stairs or walking from the parking garage to the clinic, requiring him to stop and rest multiple times. No acute chest pain similar to previous episodes before stent placement is reported.  He has a history of coronary artery disease with a stent placed in the LAD in 2020 and a follow-up in 2024. He recalls having a heart attack prior to the first stent placement, characterized by chest discomfort and slow movements, but denies similar symptoms currently. He experiences infrequent palpitations but does not feel his heart racing or skipping.  He reports loose stools, requiring him  to use the bathroom multiple times a day and night, which disrupts his sleep. He takes herbs to manage this, which have provided some relief. He also experiences swelling in his legs, managed with Lasix  40 mg daily, though he sometimes skips doses to avoid frequent urination during pastoral visits.  He has obstructive sleep apnea and notes difficulty tolerating CPAP. He denies waking up short of breath or experiencing  orthopnea, using one pillow at night. He reports fatigue, which he attributes to disrupted sleep and early waking due to his wife's work schedule.  He mentions a recent episode of feeling extremely hot with a sensation of burning from his chest to his head, occurring twice within fifteen minutes, accompanied by nausea but no syncope or stroke-like symptoms. No recent epistaxis, blood in stools, or urine.   ROS:  Review of Systems - Negative except symptoms noted above    Objective   Social History: Married.  [place].  Current Meds  Medication Sig  . acetaminophen  (TYLENOL ) 500 MG tablet Take 1,000 mg by mouth every 6 (six) hours as needed for mild pain or moderate pain (Sinus).  . aspirin  EC 81 MG EC tablet Take 1 tablet (81 mg total) by mouth daily.  . atorvastatin  (LIPITOR ) 80 MG tablet Take 1 tablet (80 mg total) by mouth daily. TAKE 1 TABLET BY MOUTH DAILY AT 6 PM.  . clopidogrel  (PLAVIX ) 75 MG tablet Take 1 tablet (75 mg total) by mouth daily with breakfast.  . escitalopram  (LEXAPRO ) 20 MG tablet Take 1 tablet (20 mg total) by mouth daily.  . ezetimibe  (ZETIA ) 10 MG tablet TAKE 1 TABLET BY MOUTH EVERY DAY  . furosemide  (LASIX ) 40 MG tablet Take 1 tablet (40 mg total) by mouth daily.  . gabapentin  (NEURONTIN ) 300 MG capsule TAKE 1 CAPSULE BY MOUTH TWICE A DAY  . isosorbide  mononitrate (IMDUR ) 30 MG 24 hr tablet Take 0.5 tablets (15 mg total) by mouth daily.  . lisinopril  (ZESTRIL ) 40 MG tablet TAKE 1/2 TABLET (20 MG TOTAL) BY MOUTH DAILY.  . metFORMIN  (GLUCOPHAGE ) 1000 MG tablet Take 1 tablet (1,000 mg total) by mouth 2 (two) times daily with a meal.  . metoprolol  succinate (TOPROL -XL) 100 MG 24 hr tablet Take with or immediately following a meal.  . nitroGLYCERIN  (NITROSTAT ) 0.4 MG SL tablet PLACE 1 TABLET UNDER THE TONGUE EVERY 5 MINUTES X 3 DOSES AS NEEDED FOR CHEST PAIN.  SABRA vitamin B-12 (CYANOCOBALAMIN) 1000 MCG tablet Take 1,000 mcg by mouth every other day.    Studies  Reviewed: SABRA   EKG Interpretation Date/Time:  Tuesday November 29 2023 08:58:33 EDT Ventricular Rate:  70 PR Interval:  178 QRS Duration:  110 QT Interval:  396 QTC Calculation: 427 R Axis:   -63  Text Interpretation: Normal sinus rhythm Left anterior fascicular block Minimal voltage criteria for LVH, may be normal variant ( Cornell product ) When compared with ECG of 07-Jun-2023 08:01, No significant change was found Confirmed by Anner Lenis ([id number]) on 11/29/2023 9:26:33 AM   Lab Results  Component Value Date   WBC 7.1 11/25/2023   HGB 13.3 11/25/2023   HCT 40.3 11/25/2023   MCV 98.3 11/25/2023   PLT 194 11/25/2023   Lab Results  Component Value Date   CHOL 120 06/16/2023   HDL 40 06/16/2023   LDLCALC 49 06/16/2023   LDLDIRECT 115.0 03/11/2021   TRIG 191 (H) 06/16/2023   CHOLHDL 3.0 06/16/2023   Lab Results  Component Value Date   NA 144  11/25/2023   K 4.6 11/25/2023   CREATININE 1.16 11/25/2023   EGFR 65 11/25/2023   GLUCOSE 121 (H) 11/25/2023   Lab Results  Component Value Date   HGBA1C 7.0 (A) 10/27/2023   PRIOR STUDIES CATH PCI (05/2018): Prox LAD 75% ISR in prox portion of prior Stent-> Synergy DES 4 x 38 mm (post-dilated to 4.5) overlapping prior stent CATH (09/2022): EF 50-55%.  Mild nonobstructive residual CAD with smooth 30% ostial and 20% distal left main stenoses; patent proximal LAD stent with mild 20% residual narrowing, and 20% smooth mid LAD STENT ISR  Echo (05/2018): EF 60-65%. No RWMA.  Normal LV size and function.  Normal RVP.  No significant valve disease.  Risk Assessment/Calculations:             Physical Exam:   VS:  BP 122/64   Pulse 70   Ht 5' 10 (1.778 m)   Wt 248 lb (112.5 kg)   SpO2 95%   BMI 35.58 kg/m    Wt Readings from Last 3 Encounters:  11/29/23 248 lb (112.5 kg)  11/25/23 248 lb (112.5 kg)  10/27/23 249 lb (112.9 kg)     GEN: Well nourished, well groomed in no acute distress; moderate her moderately obese NECK: No  JVD; No carotid bruits CARDIAC: Normal S1, S2; RRR, no murmurs, rubs, gallops RESPIRATORY:  Clear to auscultation without rales, wheezing or rhonchi ; nonlabored, good air movement. ABDOMEN: Soft, non-tender, non-distended EXTREMITIES: Bilateral lower extremity edema roughly 2-3+, difficult to assess to the stockings.; No deformity      ASSESSMENT AND PLAN: .    Problem List Items Addressed This Visit       Cardiology Problems   HLD (hyperlipidemia) - Primary   Relevant Orders   EKG 12-Lead (Completed)   HTN (hypertension)   Relevant Orders   EKG 12-Lead (Completed)   Other Visit Diagnoses       Coronary artery disease involving native coronary artery of native heart without angina pectoris       Relevant Orders   EKG 12-Lead (Completed)       Assessment and Plan Assessment & Plan   Hypertension Blood pressure well-controlled on lisinopril  40 mg and metoprolol  100 mg XL.  Hyperlipidemia Cholesterol levels outstanding in April.  Type 2 diabetes mellitus A1c of 7.0, managed with metformin .  Obstructive sleep apnea Inability to tolerate CPAP.  Loose stools Frequent loose stools, possibly affecting fluid balance and Lasix  efficacy. No blood in stools.  Hip pain Hip pain contributing to exertional dyspnea and activity limitation.  Fatigue and sleep disturbance Fatigue and sleep disturbance, possibly related to frequent nocturnal bathroom trips and early waking due to caregiving responsibilities.  Syncope-like episodes Two episodes of feeling hot and lightheaded, with no clear cardiac symptoms during episodes.  Bladder cancer History of bladder cancer.  Recording duration: 27 minutes       Informed Consent   Shared Decision Making/Informed Consent{ All outpatient stress tests require an informed consent ([id number]) ATTESTATION ORDER       :[id number]} The risks [chest pain, shortness of breath, cardiac arrhythmias, dizziness, blood pressure fluctuations,  myocardial infarction, stroke/transient ischemic attack, nausea, vomiting, allergic reaction, radiation exposure, metallic taste sensation and life-threatening complications (estimated to be 1 in 10,000)], benefits (risk stratification, diagnosing coronary artery disease, treatment guidance) and alternatives of a cardiac PET stress test were discussed in detail with Mr. Haroon and he agrees to proceed.      Follow-Up: No follow-ups on file.  I  spent *** minutes in the care of LENARDO WESTWOOD today including {CHL AMB CAR Time Based Billing Options STW (Optional):[phone]::documenting in the encounter.}      Signed, Alm MICAEL Clay, MD, MS Alm Clay, M.D., M.S. Interventional Cardiologist  [facility] Pager # [phone]

## 2023-12-01 ENCOUNTER — Ambulatory Visit: Payer: Self-pay | Admitting: Cardiology

## 2023-12-01 ENCOUNTER — Encounter: Payer: Self-pay | Admitting: Cardiology

## 2023-12-01 DIAGNOSIS — R55 Syncope and collapse: Secondary | ICD-10-CM | POA: Insufficient documentation

## 2023-12-01 DIAGNOSIS — R5383 Other fatigue: Secondary | ICD-10-CM | POA: Insufficient documentation

## 2023-12-01 NOTE — Assessment & Plan Note (Addendum)
 Unclear etiology.  Will try to avoid excess BP control and rate control.  Two episodes of feeling hot and lightheaded, with no clear cardiac symptoms during episodes. Bladder cancer History of bladder cancer.  Can be assessed with stress PET.

## 2023-12-01 NOTE — Assessment & Plan Note (Signed)
 Coronary artery disease with atypical chest pain and exertional dyspnea Atypical chest pain and exertional dyspnea with coronary artery disease.  Patent LAD stent from July 2024 cardiac catheterization.  Differential diagnosis includes microvascular disease, musculoskeletal pain, or gastrointestinal causes. No significant heart failure symptoms. Previous stents in LAD with no significant new narrowing. - Order stress PET to evaluate for ischemic macrovascular or possibly microvascular disease. - Continue Toprol -XL 100 mg daily along with lisinopril  20 mg (1/2 of 40 mg tab) - Increase Isosorbide  mononitrate to 30 mg daily => if no evidence of macrovascular disease, would likely convert to Ranexa and potentially reduce lisinopril  dose to add amlodipine . - Continue combination of rosuvastatin 20 mg and Zetia  10 mg with excellent lipid control. - Follow up in 2-3 months, earlier if stress PET results indicate need for urgent intervention.

## 2023-12-01 NOTE — Assessment & Plan Note (Signed)
 Given his age and existence of CAD, quite likely he does have diastolic dysfunction.  He has edema as well as some exertional dyspnea and orthopnea symptoms. BP seems to be pretty well-controlled with 20 mg lisinopril  +100 mg Toprol -goals keep afterload down. Continue furosemide  40 mg daily-low threshold to take additional doses for worsening dyspnea.   Discussed sliding scale for weight gain greater than 3 pounds doubling up the dose until back to baseline dry weight

## 2023-12-01 NOTE — Assessment & Plan Note (Signed)
 Blood pressure finally seems well-controlled today. - Continue on lisinopril  20 mg and metoprolol  100 mg XL - If there is concern for microvascular disease, would reduce lisinopril  dose in order to use amlodipine .

## 2023-12-01 NOTE — Progress Notes (Signed)
 Great news, the cardiac stress PET looked quite reassuring.  It was read as LOW RISK. There did appear to be normal flow blood flow to the big and large arteries.  Some calcification but nonobstructive.  There was no comment on microvascular ischemia but it seemed like the blood flow was stable.  This would suggest that symptoms are not caused by heart artery discomfort and we agreed to simply titrate other treatment options for the intermittent complaints.   Alm Clay, MD

## 2023-12-01 NOTE — Assessment & Plan Note (Signed)
 He has been maintained on ASA/Plavix  DAPT.  Depending on what his stress test looks like we could potentially consider stopping the aspirin  and continue Plavix  by itself.  Reassess in outpatient.

## 2023-12-01 NOTE — Assessment & Plan Note (Signed)
 Given his history of CAD, having worsening exertional dyspnea and intolerance warrants ischemic evaluation until we know for sure what the year recently showed. Concerned that the symptoms could be akin to his angina.  Will evaluate with NM PET Cardiac Perfusion evaluation to evaluate for macro versus microvascular disease.

## 2023-12-01 NOTE — Assessment & Plan Note (Signed)
 Unable to tolerate CPAP

## 2023-12-01 NOTE — Assessment & Plan Note (Addendum)
 Extensive stented section in the proximal to mid LAD with trivial ISR by last cath.   He has had PTCA and overlapping stent placed in the past.   Unfortunate, he is now having somewhat concerning symptoms of exertional dyspnea with atypical chest discomfort. - Despite being several years out from PCI, he is maintained on DAPT (ASA 81/Plavix  75 mg daily.)  Symptoms because we are looking for new ischemia on Myoview, would continue current medication regimen including DAPT.

## 2023-12-01 NOTE — Assessment & Plan Note (Signed)
 Cholesterol levels outstanding in April.  With an LDL of 49.  Triglycerides are slightly elevated at 191-goes along with elevated A1c.   Unfortunately, he is somewhat sedentary due to knee pain, chronic pain and obesity.

## 2024-01-07 ENCOUNTER — Other Ambulatory Visit: Payer: Self-pay | Admitting: Family Medicine

## 2024-01-19 ENCOUNTER — Other Ambulatory Visit: Payer: Self-pay | Admitting: Family Medicine

## 2024-01-24 DIAGNOSIS — N5201 Erectile dysfunction due to arterial insufficiency: Secondary | ICD-10-CM | POA: Diagnosis not present

## 2024-01-24 DIAGNOSIS — Z8551 Personal history of malignant neoplasm of bladder: Secondary | ICD-10-CM | POA: Diagnosis not present

## 2024-01-27 ENCOUNTER — Ambulatory Visit: Admitting: Family Medicine

## 2024-01-27 ENCOUNTER — Encounter: Payer: Self-pay | Admitting: Family Medicine

## 2024-01-27 VITALS — BP 132/85 | HR 73 | Temp 97.8°F | Ht 70.0 in | Wt 245.8 lb

## 2024-01-27 DIAGNOSIS — E119 Type 2 diabetes mellitus without complications: Secondary | ICD-10-CM | POA: Diagnosis not present

## 2024-01-27 DIAGNOSIS — N2889 Other specified disorders of kidney and ureter: Secondary | ICD-10-CM | POA: Diagnosis not present

## 2024-01-27 DIAGNOSIS — Z7984 Long term (current) use of oral hypoglycemic drugs: Secondary | ICD-10-CM | POA: Diagnosis not present

## 2024-01-27 DIAGNOSIS — E1121 Type 2 diabetes mellitus with diabetic nephropathy: Secondary | ICD-10-CM | POA: Diagnosis not present

## 2024-01-27 DIAGNOSIS — I10 Essential (primary) hypertension: Secondary | ICD-10-CM | POA: Diagnosis not present

## 2024-01-27 DIAGNOSIS — E7800 Pure hypercholesterolemia, unspecified: Secondary | ICD-10-CM

## 2024-01-27 LAB — LIPID PANEL
Cholesterol: 81 mg/dL (ref 0–200)
HDL: 36.9 mg/dL — ABNORMAL LOW (ref >39.00)
LDL Cholesterol: 19 mg/dL (ref 0–99)
NonHDL: 43.92
Total CHOL/HDL Ratio: 2
Triglycerides: 126 mg/dL (ref 0.0–149.0)
VLDL: 25.2 mg/dL (ref 0.0–40.0)

## 2024-01-27 LAB — BASIC METABOLIC PANEL WITH GFR
BUN: 14 mg/dL (ref 6–23)
CO2: 35 meq/L — ABNORMAL HIGH (ref 19–32)
Calcium: 9.1 mg/dL (ref 8.4–10.5)
Chloride: 104 meq/L (ref 96–112)
Creatinine, Ser: 1.02 mg/dL (ref 0.40–1.50)
GFR: 71.15 mL/min (ref >60.00)
Glucose, Bld: 120 mg/dL — ABNORMAL HIGH (ref 70–99)
Potassium: 4.9 meq/L (ref 3.5–5.1)
Sodium: 144 meq/L (ref 135–145)

## 2024-01-27 LAB — POCT GLYCOSYLATED HEMOGLOBIN (HGB A1C)
HbA1c POC (<> result, manual entry): 6.6 % (ref 4.0–5.6)
HbA1c, POC (controlled diabetic range): 6.6 % (ref 0.0–7.0)
HbA1c, POC (prediabetic range): 6.6 % — AB (ref 5.7–6.4)
Hemoglobin A1C: 6.6 % — AB (ref 4.0–5.6)

## 2024-01-27 NOTE — Progress Notes (Signed)
 OFFICE VISIT  01/27/2024  CC:  Chief Complaint  Patient presents with   Medical Management of Chronic Issues    Patient is a 78 y.o. male who presents for 37-month follow-up diabetes, hypertension, and hypercholesterolemia. A/P as of last visit: #1 diabetes with nephropathy. Point-of-care hemoglobin A1c today is 7.0%, unchanged compared to 3 months ago. Increase metformin  to 1000 mg twice a day. Urine microalbumin/creatinine today.   2.  Hypertension, well-controlled on Toprol -XL 100 mg a day and lisinopril  1/2 of 40mg  tab daily. Monitor electrolytes and creatinine today.   #3 hypercholesterolemia. He is on Zetia  10 mg a day and a atorvastatin  80 mg a day. Last LDL 49 about four months ago. Plan lipid panel 3 mo.  INTERIM HX: Richard [name] is feeling well. Since I last saw him he was evaluated for suspicion of anginal equivalent.  His heart doctor increased his Imdur  to 30 mg a day and he obtained NM PET CT Cardiac perfusion testing.  This was a low risk study--> no ischemia.  EF 57%.  Richard [name] has no acute concerns today.  ROS --> no fevers, no CP, no SOB, no wheezing, no cough, no dizziness, no HAs, no rashes, no melena/hematochezia.  No polyuria or polydipsia.  No myalgias or arthralgias.  No focal weakness, paresthesias, or tremors.  No acute vision or hearing abnormalities.  No dysuria or unusual/new urinary urgency or frequency.  No recent changes in lower legs. No n/v/d or abd pain.  No palpitations.    Past Medical History:  Diagnosis Date   Chronic renal insufficiency, stage III (moderate)    Coronary artery disease    a. DES to LAD 04/2011 - took Effient/ASA x 1 yr, now on ASA only. b. Cath 10/2012: patent stent, otherwise normal cors. c. 05/2018 Prox LAD stent 75% restenosed, DES placed.  08/30/19 cath: patent stent, mild distal LAD dz not requiring intervention, LVEF nl, EDP normal: maximize antianginal meds.   COVID 04/04/2020   asymptomatic   Diabetes mellitus with complication  (HCC) 05/2018   A1c 6.9%   GERD (gastroesophageal reflux disease)    HAS HAD ESOPHAGUS STRETCHED SEVERAL TIMES IN THE PAST   History of bladder cancer 04/2020   TURBT 04/2020.  Surveillance cysto clear as of 07/2021   Hyperlipidemia    goal LDL < 70   Hypertension    Myocardial infarction (HCC) 04/21/2016   OSA on CPAP 10/18/2011   doesn't wear it   Poor historian    talk to dtr pam hooker cell [phone]   Right knee pain    RIGHT KNEE MEDIAL MENSICAL TEAR    Past Surgical History:  Procedure Laterality Date   CARDIAC CATHETERIZATION  06/2017   In-stent restenosis cleared out   CARDIOVASCULAR STRESS TEST     Latter part of 2019--normal per cardiologist's note from 10/2017   CERVICAL FUSION  1990 and 1993   X 2   SURGERIES    SLIGHT LIMITATION ROM   COLONOSCOPY  11/24/2004; 2018   NORMAL.  Recall 2016.  Repeat 2018->tubular adenoma x 1 (Dr. Jefrey, Dig hea spec).  06/2022 polyp x 1   CORONARY ANGIOPLASTY WITH STENT PLACEMENT     CORONARY STENT INTERVENTION N/A 05/22/2018   Prox LAD for in stent restenosis.  DAPT x 1 yr.  Procedure: CORONARY STENT INTERVENTION;  Surgeon: Dann Candyce RAMAN, MD;  Location: [facility] INVASIVE CV LAB;  Service: Cardiovascular;  Laterality: N/A;   CORONARY ULTRASOUND/IVUS N/A 05/22/2018   Procedure: Intravascular Ultrasound/IVUS;  Surgeon: Dann Candyce RAMAN, MD;  Location: [facility] INVASIVE CV LAB;  Service: Cardiovascular;  Laterality: N/A;   CYSTOSCOPY W/ RETROGRADES Bilateral 04/22/2020   Procedure: CYSTOSCOPY WITH RETROGRADE PYELOGRAM;  Surgeon: Rosalind Zachary NOVAK, MD;  Location: [facility];  Service: Urology;  Laterality: Bilateral;  30 MINS   ESOPHAGOGASTRODUODENOSCOPY  05/2016   mild reactive changes   KNEE ARTHROSCOPY  10/22/2011   Procedure: ARTHROSCOPY KNEE;  Surgeon: Dempsey LULLA Moan, MD;  Location: [facility];  Service: Orthopedics;  Laterality: Right;  Right knee scope with debridement   LAPAROSCOPIC CHOLECYSTECTOMY  2018   LEFT HEART  CATH AND CORONARY ANGIOGRAPHY N/A 05/22/2018   DES placed for in stent restenosis.  EF 55-60%.  Procedure: LEFT HEART CATH AND CORONARY ANGIOGRAPHY;  Surgeon: Dann Candyce RAMAN, MD;  Location: [facility] INVASIVE CV LAB;  Service: Cardiovascular;  Laterality: N/A;   LEFT HEART CATH AND CORONARY ANGIOGRAPHY N/A 08/30/2019   No change from 2020 cath->patent LAD stent, mild distal LAD dz.  No intervention required. Nitrates + possible future CCB antianginal for poss microvasc dz. Procedure: LEFT HEART CATH AND CORONARY ANGIOGRAPHY;  Surgeon: Dann Candyce RAMAN, MD;  Location: [facility] INVASIVE CV LAB;  Service: Cardiovascular;  Laterality: N/A;   LEFT HEART CATH AND CORONARY ANGIOGRAPHY N/A 10/01/2022   Procedure: LEFT HEART CATH AND CORONARY ANGIOGRAPHY;  Surgeon: Burnard Debby LABOR, MD;  Location: [facility] INVASIVE CV LAB;  Service: Cardiovascular;  Laterality: N/A;   LEFT HEART CATHETERIZATION WITH CORONARY ANGIOGRAM N/A 10/08/2012   Procedure: LEFT HEART CATHETERIZATION WITH CORONARY ANGIOGRAM;  Surgeon: Dorn JINNY Lesches, MD;  Location: [facility] CATH LAB;  Service: Cardiovascular;  Laterality: N/A;   NASAL SINUS SURGERY  yrs ago   ONE SINUS SURGERY THRU NOSE AND ANOTHER SINUS SURGERY THRU INCISION ABOVE RT EYE   TRANSTHORACIC ECHOCARDIOGRAM  05/21/2018   EF 60-65%, grd I DD, no valvular probs.   TRANSTHORACIC ECHOCARDIOGRAM  05/21/2018   EF 60-65%, normal wall motion, +DD, normal valves and RV fxn.   TRANSURETHRAL RESECTION OF BLADDER TUMOR N/A 04/22/2020   PATH: NONinvasive high grade uroepithelial malignancy. Procedure: TRANSURETHRAL RESECTION OF BLADDER TUMOR (TURBT); FULGERATION;  Surgeon: Rosalind Zachary NOVAK, MD;  Location: [facility];  Service: Urology;  Laterality: N/A;    Outpatient Medications Prior to Visit  Medication Sig Dispense Refill   acetaminophen  (TYLENOL ) 500 MG tablet Take 1,000 mg by mouth every 6 (six) hours as needed for mild pain or moderate pain (Sinus).     aspirin  EC 81 MG EC  tablet Take 1 tablet (81 mg total) by mouth daily.     atorvastatin  (LIPITOR ) 80 MG tablet TAKE 1 TABLET (80 MG TOTAL) BY MOUTH DAILY. TAKE 1 TABLET BY MOUTH DAILY AT 6 PM. 90 tablet 1   clopidogrel  (PLAVIX ) 75 MG tablet Take 1 tablet (75 mg total) by mouth daily with breakfast. 90 tablet 1   escitalopram  (LEXAPRO ) 20 MG tablet TAKE 1 TABLET BY MOUTH EVERY DAY 90 tablet 1   ezetimibe  (ZETIA ) 10 MG tablet TAKE 1 TABLET BY MOUTH EVERY DAY 90 tablet 2   furosemide  (LASIX ) 40 MG tablet TAKE 1 TABLET BY MOUTH EVERY DAY 90 tablet 1   gabapentin  (NEURONTIN ) 300 MG capsule TAKE 1 CAPSULE BY MOUTH TWICE A DAY 60 capsule 0   isosorbide  mononitrate (IMDUR ) 30 MG 24 hr tablet Take 1 tablet (30 mg total) by mouth daily. 90 tablet 3   lisinopril  (ZESTRIL ) 40 MG tablet TAKE 1/2 TABLET (20 MG TOTAL) BY  MOUTH DAILY. 45 tablet 3   metFORMIN  (GLUCOPHAGE ) 1000 MG tablet Take 1 tablet (1,000 mg total) by mouth 2 (two) times daily with a meal. 180 tablet 3   metoprolol  succinate (TOPROL -XL) 100 MG 24 hr tablet TAKE 1 TABLET WITH OR IMMEDIATELY FOLLOWING A MEAL. 90 tablet 1   nitroGLYCERIN  (NITROSTAT ) 0.4 MG SL tablet PLACE 1 TABLET UNDER THE TONGUE EVERY 5 MINUTES X 3 DOSES AS NEEDED FOR CHEST PAIN. 25 tablet 3   vitamin B-12 (CYANOCOBALAMIN) 1000 MCG tablet Take 1,000 mcg by mouth every other day.     No facility-administered medications prior to visit.    No Known Allergies  Review of Systems As per HPI  PE:    01/27/2024    8:09 AM 11/30/2023    7:14 AM 11/30/2023    7:13 AM  Vitals with BMI  Height 5' 10    Weight 245 lbs 13 oz    BMI 35.27    Systolic 132 129 868  Diastolic 85 56 58  Pulse 73 80 82     Physical Exam  Gen: Alert, well appearing.  Patient is oriented to person, place, time, and situation. AFFECT: pleasant, lucid thought and speech. CV: RRR, no m/r/g.   LUNGS: CTA bilat, nonlabored resps, good aeration in all lung fields. EXT: no clubbing or cyanosis.  no edema.    LABS:   Last CBC Lab Results  Component Value Date   WBC 7.1 11/25/2023   HGB 13.3 11/25/2023   HCT 40.3 11/25/2023   MCV 98.3 11/25/2023   MCH 32.4 11/25/2023   RDW 13.4 11/25/2023   PLT 194 11/25/2023   Last metabolic panel Lab Results  Component Value Date   GLUCOSE 121 (H) 11/25/2023   NA 144 11/25/2023   K 4.6 11/25/2023   CL 107 11/25/2023   CO2 30 11/25/2023   BUN 20 11/25/2023   CREATININE 1.16 11/25/2023   EGFR 65 11/25/2023   CALCIUM  8.6 11/25/2023   PROT 5.7 (L) 11/25/2023   ALBUMIN 4.2 06/16/2023   LABGLOB 1.9 06/16/2023   BILITOT 0.3 11/25/2023   ALKPHOS 86 06/16/2023   AST 16 11/25/2023   ALT 8 (L) 11/25/2023   ANIONGAP 8 05/22/2018   Last lipids Lab Results  Component Value Date   CHOL 120 06/16/2023   HDL 40 06/16/2023   LDLCALC 49 06/16/2023   LDLDIRECT 115.0 03/11/2021   TRIG 191 (H) 06/16/2023   CHOLHDL 3.0 06/16/2023   Last hemoglobin A1c Lab Results  Component Value Date   HGBA1C 6.6 (A) 01/27/2024   HGBA1C 6.6 01/27/2024   HGBA1C 6.6 (A) 01/27/2024   HGBA1C 6.6 01/27/2024   Lab Results  Component Value Date   DDIMER 0.59 (H) 11/25/2023   IMPRESSION AND PLAN:  #1 diabetes with nephropathy. Point-of-care hemoglobin A1c today is improved to 6.6%. Continue metformin  1000 mg twice a day. Monitor renal function today.   2.  Hypertension, well-controlled on Toprol -XL 100 mg a day and lisinopril  1/2 of 40mg  tab daily. Monitor electrolytes and creatinine today.   #3 hypercholesterolemia. He is on Zetia  10 mg a day and a atorvastatin  80 mg a day. Last LDL 49 about 7 mo ago. Lipid panel today.  An After Visit Summary was printed and given to the patient.  FOLLOW UP: Return in about 3 months (around 04/28/2024) for routine chronic illness f/u. Next cpe 07/2024 Signed:  Gerlene Hockey, MD           01/27/2024

## 2024-01-29 ENCOUNTER — Ambulatory Visit: Payer: Self-pay | Admitting: Family Medicine

## 2024-02-03 ENCOUNTER — Other Ambulatory Visit: Payer: Self-pay | Admitting: Family Medicine

## 2024-02-08 ENCOUNTER — Other Ambulatory Visit: Payer: Self-pay | Admitting: Family Medicine

## 2024-02-14 ENCOUNTER — Ambulatory Visit: Attending: Cardiology | Admitting: Cardiology

## 2024-02-14 ENCOUNTER — Encounter: Payer: Self-pay | Admitting: Cardiology

## 2024-02-14 VITALS — BP 127/70 | HR 64 | Resp 16 | Ht 70.0 in | Wt 251.0 lb

## 2024-02-14 DIAGNOSIS — E781 Pure hyperglyceridemia: Secondary | ICD-10-CM | POA: Diagnosis not present

## 2024-02-14 DIAGNOSIS — I25119 Atherosclerotic heart disease of native coronary artery with unspecified angina pectoris: Secondary | ICD-10-CM

## 2024-02-14 DIAGNOSIS — I10 Essential (primary) hypertension: Secondary | ICD-10-CM | POA: Diagnosis not present

## 2024-02-14 DIAGNOSIS — Z9861 Coronary angioplasty status: Secondary | ICD-10-CM | POA: Diagnosis not present

## 2024-02-14 DIAGNOSIS — E1169 Type 2 diabetes mellitus with other specified complication: Secondary | ICD-10-CM | POA: Diagnosis not present

## 2024-02-14 DIAGNOSIS — I5032 Chronic diastolic (congestive) heart failure: Secondary | ICD-10-CM | POA: Diagnosis not present

## 2024-02-14 DIAGNOSIS — E785 Hyperlipidemia, unspecified: Secondary | ICD-10-CM | POA: Diagnosis not present

## 2024-02-14 DIAGNOSIS — Z955 Presence of coronary angioplasty implant and graft: Secondary | ICD-10-CM

## 2024-02-14 DIAGNOSIS — I2510 Atherosclerotic heart disease of native coronary artery without angina pectoris: Secondary | ICD-10-CM | POA: Diagnosis not present

## 2024-02-14 NOTE — Progress Notes (Unsigned)
 Cardiology Office Note:  .   Date:  02/14/2024  ID:  Richard [name], DOB 08-18-1945, MRN [id number] PCP: Candise Aleene DEL, MD  [facility] Providers Cardiologist:  Alm Clay, MD { Click to update primary MD,subspecialty MD or APP then REFRESH:1}    Chief Complaint  Patient presents with   CAD S/P percutaneous coronary angioplasty   Follow-up    2-3 months    Patient Profile: .     Richard [name] is a  moderately obese  78 y.o. male  with a PMH noted below  who presents here for 3 month f/u. Referred at the request of McGowen, Aleene DEL, MD.  Richard [name] was previously followed by Dr. Gordy Reek although he does not recall the last time he saw him.  He is ultimately followed up with Dr. Wilhemenia seen April 2025  PMH: CAD-LAD PCI February 2013-LAD PCI => August 2014 patent LAD stent with 30% ISR March 2020: 70% ISR of proximal LAD-PTCA and overlapping proximal LAD stent. => June 2021: Patent LAD stent segment with 20% distal left main 25% mid LAD July 2024 (unstable angina): Mild nonobstructive residual CAD.  Smooth 30% ostial transient distal left main.  Patent proximal LAD stents with 20% residual narrowing) mid and mid LAD stenosis. HTN HLD OSA : Heavy snoring, with likely diagnosis of OSA =>  refused CPAP machine H/o bladder cancer    Richard [name] was last seen on 11/09/2023  Subjective  Discussed the use of AI scribe software for clinical note transcription with the patient, who gave verbal consent to proceed.  History of Present Illness      Cardiovascular ROS: {roscv:310661}  ROS:  Review of Systems - {ros master:310782}    Objective   Current Meds  Medication Sig   acetaminophen  (TYLENOL ) 500 MG tablet Take 1,000 mg by mouth every 6 (six) hours as needed for mild pain or moderate pain (Sinus).   aspirin  EC 81 MG EC tablet Take 1 tablet (81 mg total) by mouth daily.   atorvastatin  (LIPITOR ) 80 MG tablet TAKE 1 TABLET (80 MG TOTAL) BY MOUTH DAILY.  TAKE 1 TABLET BY MOUTH DAILY AT 6 PM.   clopidogrel  (PLAVIX ) 75 MG tablet TAKE 1 TABLET BY MOUTH DAILY WITH BREAKFAST.   escitalopram  (LEXAPRO ) 20 MG tablet TAKE 1 TABLET BY MOUTH EVERY DAY   ezetimibe  (ZETIA ) 10 MG tablet TAKE 1 TABLET BY MOUTH EVERY DAY   furosemide  (LASIX ) 40 MG tablet TAKE 1 TABLET BY MOUTH EVERY DAY   gabapentin  (NEURONTIN ) 300 MG capsule TAKE 1 CAPSULE BY MOUTH TWICE A DAY   isosorbide  mononitrate (IMDUR ) 30 MG 24 hr tablet Take 1 tablet (30 mg total) by mouth daily.   lisinopril  (ZESTRIL ) 40 MG tablet TAKE 1/2 TABLET (20 MG TOTAL) BY MOUTH DAILY.   metFORMIN  (GLUCOPHAGE ) 1000 MG tablet Take 1 tablet (1,000 mg total) by mouth 2 (two) times daily with a meal.   metoprolol  succinate (TOPROL -XL) 100 MG 24 hr tablet TAKE 1 TABLET WITH OR IMMEDIATELY FOLLOWING A MEAL.   nitroGLYCERIN  (NITROSTAT ) 0.4 MG SL tablet PLACE 1 TABLET UNDER THE TONGUE EVERY 5 MINUTES X 3 DOSES PRN CHEST PAIN.    Studies Reviewed: SABRA       Lab Results  Component Value Date   NA 144 01/27/2024   K 4.9 01/27/2024   CREATININE 1.02 01/27/2024   GFR 71.15 01/27/2024   GLUCOSE 120 (H) 01/27/2024   Lab Results  Component Value Date   CHOL  81 01/27/2024   HDL 36.90 (L) 01/27/2024   LDLCALC 19 01/27/2024   LDLDIRECT 115.0 03/11/2021   TRIG 126.0 01/27/2024   CHOLHDL 2 01/27/2024   Lab Results  Component Value Date   HGBA1C 6.6 (A) 01/27/2024   Lab Results  Component Value Date   WBC 7.1 11/25/2023   HGB 13.3 11/25/2023   HCT 40.3 11/25/2023   MCV 98.3 11/25/2023   PLT 194 11/25/2023    Results  PRIOR STUDIES CATH PCI (05/2018): Prox LAD 75% ISR in prox portion of prior Stent-> Synergy DES 4 x 38 mm (post-dilated to 4.5) overlapping prior stent CATH (09/2022): EF 50-55%.  Mild nonobstructive residual CAD with smooth 30% ostial and 20% distal left main stenoses; patent proximal LAD stent with mild 20% residual narrowing, and 20% smooth mid LAD STENT ISR  Echo (05/2018): EF 60-65%. No  RWMA.  Normal LV size and function.  Normal RVP.  No significant valve disease.  Risk Assessment/Calculations:              Physical Exam:   VS:  BP 127/70 (BP Location: Left Arm, Patient Position: Sitting, Cuff Size: Normal)   Pulse 64   Resp 16   Ht 5' 10 (1.778 m)   Wt 251 lb (113.9 kg)   SpO2 92%   BMI 36.01 kg/m    Wt Readings from Last 3 Encounters:  02/14/24 251 lb (113.9 kg)  01/27/24 245 lb 12.8 oz (111.5 kg)  11/29/23 248 lb (112.5 kg)    Physical Exam    GEN: Well nourished, well developed in no acute distress; *** NECK: No JVD; No carotid bruits CARDIAC: Normal S1, S2; RRR, no murmurs, rubs, gallops RESPIRATORY:  Clear to auscultation without rales, wheezing or rhonchi ; nonlabored, good air movement. ABDOMEN: Soft, non-tender, non-distended EXTREMITIES:  No edema; No deformity      ASSESSMENT AND PLAN: .    Problem List Items Addressed This Visit       Cardiology Problems   Chronic diastolic heart failure (HCC) (Chronic)   Coronary artery disease involving native coronary artery with angina pectoris - Primary (Chronic)   Hyperlipidemia associated with type 2 diabetes mellitus (HCC) (Chronic)   Primary hypertension (Chronic)     Other   S/P coronary artery stent placement (Chronic)     Assessment & Plan       {Are you ordering a CV Procedure (e.g. stress test, cath, DCCV, TEE, etc)?   Press F2        :[id number]}   Follow-Up: No follow-ups on file.  I spent *** minutes in the care of TAIJON VINK today including {CHL AMB CAR Time Based Billing Options STW (Optional):[id number]::documenting in the encounter.}      Signed, Alm MICAEL Clay, MD, MS Alm Clay, M.D., M.S. Interventional Cardiologist  [facility] Pager # [phone]

## 2024-02-14 NOTE — Patient Instructions (Addendum)
 Medication Instructions:   No changes *If you need a refill on your cardiac medications before your next appointment, please call your pharmacy*   Lab Work: Not needed   Testing/Procedures:  Not needed  Follow-Up: At [facility], you and your health needs are our priority.  As part of our continuing mission to provide you with exceptional heart care, we have created designated Provider Care Teams.  These Care Teams include your primary Cardiologist (physician) and Advanced Practice Providers (APPs -  Physician Assistants and Nurse Practitioners) who all work together to provide you with the care you need, when you need it.     Your next appointment:   9 month(s)  The format for your next appointment:   In Person  Provider:   Bryan Lemma, MD

## 2024-02-16 ENCOUNTER — Encounter: Payer: Self-pay | Admitting: Cardiology

## 2024-02-16 NOTE — Assessment & Plan Note (Signed)
 He is 5-1/2 years out from PCI but remains on aspirin  and Plavix . The very fact that the stress test suggested no evidence of ischemia is very reassuring that the stent is probably fully healed. Discussed the potential of stopping either aspirin  or Plavix  with it preferable to stay on Plavix  as long as there is no significant bleeding.  Okay to hold aspirin  and or Plavix  5 to 7 days preop for surgeries or procedures.  (In fact surgery would be an impetus to potentially decide to stop and switch to this SPAT with Plavix  simply because of the long stented segment in the LAD.

## 2024-02-16 NOTE — Assessment & Plan Note (Signed)
 Extensive stented section in the proximal to mid LAD with trivial ISR by last cath.   He has had PTCA and overlapping stent placed in the past.   Unfortunate, he is now having somewhat concerning symptoms of exertional dyspnea with atypical chest discomfort. - Despite being several years out from PCI, he is maintained on DAPT (ASA 81/Plavix  75 mg daily.) ==> With nonischemic Myoview, probably okay to stop aspirin  and continue Plavix  as it would be favorable given the extensive LAD stent  Okay to hold antiplatelet agents 5 to 7 days preop for surgeries or procedures.

## 2024-02-16 NOTE — Assessment & Plan Note (Addendum)
 Probably related to diabetes.  Most recent labs show well-controlled triglycerides and LDL.

## 2024-02-16 NOTE — Assessment & Plan Note (Signed)
 Seems pretty euvolemic.  Edema pretty well-controlled on 40 mg Lasix .  No real PND or orthopnea. He is on lisinopril  20 mg daily for afterload reduction along with high-dose Toprol .   Discussed sliding scale furosemide  use.

## 2024-02-16 NOTE — Assessment & Plan Note (Addendum)
 Lipid panel looks great on 80 mg atorvastatin  Diabetes pretty well-controlled with an A1c of C of 6.6.  Not currently on anything besides metformin .  Will defer to PCP but could consider SGLT2 inhibitor or GLP-1 agonist.  LDL 19, and A1c 6.6%. Hyperlipidemia managed with ezetimibe . Cholesterol levels are excellent. - Continue metformin  1000 mg twice daily. - Continue ezetimibe  10 mg daily along with atorvastatin  80 mg daily.

## 2024-02-16 NOTE — Assessment & Plan Note (Signed)
 Jon BROCKS has been pretty well-controlled and most recent stress PET was nonischemic. Angina well-controlled with combination of Toprol  XL 100 mg daily and Imdur  30 mL daily.  No current angina. Stress PET scan showed low risk, normal myocardial function, and open stents. No arrhythmias reported. - Continue aspirin  81 mg daily, Plavix , ezetimibe  10 mg daily, lisinopril  40 mg daily, metoprolol  succinate 100 mg daily, isosorbide  30 mg daily. - Scheduled follow-up appointment in September.

## 2024-02-16 NOTE — Assessment & Plan Note (Signed)
 Blood pressure well-controlled with current regimen. - Continue lisinopril  20 mg daily. - Continue metoprolol  succinate 100 mg daily.  As a stress PET did not suggest microvascular disease, we will continue current medicines with current dosing of lisinopril  as opposed to reducing and adding amlodipine .

## 2024-03-01 ENCOUNTER — Other Ambulatory Visit: Payer: Self-pay | Admitting: Student

## 2024-05-03 ENCOUNTER — Ambulatory Visit: Admitting: Family Medicine

## 2024-06-13 ENCOUNTER — Encounter
